# Patient Record
Sex: Male | Born: 1970 | Race: White | Hispanic: No | Marital: Married | Smoking: Current every day smoker
Health system: Southern US, Community
[De-identification: ages and names within clinical notes are randomized; demographics above are authoritative.]

## PROBLEM LIST (undated history)

## (undated) DIAGNOSIS — F329 Major depressive disorder, single episode, unspecified: Secondary | ICD-10-CM

## (undated) DIAGNOSIS — M8700 Idiopathic aseptic necrosis of unspecified bone: Secondary | ICD-10-CM

## (undated) DIAGNOSIS — E663 Overweight: Secondary | ICD-10-CM

## (undated) DIAGNOSIS — E785 Hyperlipidemia, unspecified: Secondary | ICD-10-CM

## (undated) DIAGNOSIS — F1011 Alcohol abuse, in remission: Secondary | ICD-10-CM

## (undated) DIAGNOSIS — F32A Depression, unspecified: Secondary | ICD-10-CM

## (undated) DIAGNOSIS — K219 Gastro-esophageal reflux disease without esophagitis: Secondary | ICD-10-CM

## (undated) DIAGNOSIS — M1990 Unspecified osteoarthritis, unspecified site: Secondary | ICD-10-CM

## (undated) DIAGNOSIS — I10 Essential (primary) hypertension: Secondary | ICD-10-CM

## (undated) DIAGNOSIS — F419 Anxiety disorder, unspecified: Secondary | ICD-10-CM

## (undated) DIAGNOSIS — R7301 Impaired fasting glucose: Secondary | ICD-10-CM

## (undated) HISTORY — DX: Anxiety disorder, unspecified: F41.9

## (undated) HISTORY — DX: Overweight: E66.3

## (undated) HISTORY — DX: Impaired fasting glucose: R73.01

## (undated) HISTORY — PX: CARPAL TUNNEL RELEASE: SHX101

## (undated) HISTORY — PX: TESTICLE TORSION REDUCTION: SHX795

## (undated) HISTORY — DX: Hyperlipidemia, unspecified: E78.5

## (undated) HISTORY — DX: Alcohol abuse, in remission: F10.11

---

## 1993-10-05 HISTORY — PX: VASECTOMY: SHX75

## 1995-10-06 HISTORY — PX: VASECTOMY REVERSAL: SHX243

## 2004-07-25 ENCOUNTER — Ambulatory Visit: Payer: Self-pay | Admitting: Unknown Physician Specialty

## 2004-10-28 ENCOUNTER — Ambulatory Visit: Payer: Self-pay | Admitting: Unknown Physician Specialty

## 2006-03-03 ENCOUNTER — Emergency Department: Payer: Self-pay | Admitting: Internal Medicine

## 2006-03-06 ENCOUNTER — Emergency Department: Payer: Self-pay | Admitting: Emergency Medicine

## 2007-05-11 ENCOUNTER — Ambulatory Visit: Payer: Self-pay | Admitting: Unknown Physician Specialty

## 2007-05-11 ENCOUNTER — Other Ambulatory Visit: Payer: Self-pay

## 2007-05-12 ENCOUNTER — Ambulatory Visit: Payer: Self-pay | Admitting: Unknown Physician Specialty

## 2011-06-06 ENCOUNTER — Emergency Department: Payer: Self-pay | Admitting: Emergency Medicine

## 2011-10-10 ENCOUNTER — Emergency Department: Payer: Self-pay | Admitting: Unknown Physician Specialty

## 2011-10-10 LAB — URINALYSIS, COMPLETE
Bilirubin,UR: NEGATIVE
Glucose,UR: NEGATIVE mg/dL (ref 0–75)
Ketone: NEGATIVE
Leukocyte Esterase: NEGATIVE
Nitrite: NEGATIVE
Ph: 5 (ref 4.5–8.0)
Protein: NEGATIVE
RBC,UR: 706 /HPF (ref 0–5)
Specific Gravity: 1.02 (ref 1.003–1.030)
Squamous Epithelial: <1
WBC UR: 4 /HPF (ref 0–5)

## 2012-10-04 ENCOUNTER — Emergency Department: Payer: Self-pay | Admitting: Emergency Medicine

## 2013-04-05 ENCOUNTER — Ambulatory Visit: Payer: Self-pay | Admitting: Orthopedic Surgery

## 2013-12-18 NOTE — Progress Notes (Signed)
Surgery on 12/26/13.  Proep on 3/20 at 130pm.Need orders in EPIC.  Thank You.

## 2013-12-19 ENCOUNTER — Encounter (HOSPITAL_COMMUNITY): Payer: Self-pay | Admitting: Pharmacy Technician

## 2013-12-20 NOTE — Progress Notes (Signed)
Surgery on 12/26/13.  preop on 12/22/13 at 130pm.   Need orders in EPIC.  Thank You.

## 2013-12-21 NOTE — Patient Instructions (Addendum)
      Your procedure is scheduled on:  12/26/13 TUESDAY  Report to [facility] at   0845    AM.  Call this number if you have problems the morning of surgery: [phone]        Do not eat food  Or drink :After Midnight. Monday NIGHT   Take these medicines the morning of surgery with A SIP OF WATER: METOPROLOL, AMLODIPINE, Tagamet   .  Contacts, dentures or partial plates, or metal hairpins  can not be worn to surgery. Your family will be responsible for glasses, dentures, hearing aides while you are in surgery  Leave suitcase in the car. After surgery it may be brought to your room.  For patients admitted to the hospital, checkout time is 11:00 AM day of  discharge.                DO NOT WEAR JEWELRY, LOTIONS, POWDERS, OR PERFUMES.  WOMEN-- DO NOT SHAVE LEGS OR UNDERARMS FOR 48 HOURS BEFORE SHOWERS. MEN MAY SHAVE FACE.  Patients discharged the day of surgery will not be allowed to drive home. IF going home the day of surgery, you must have a driver and someone to stay with you for the first 24 hours  Name and phone number of your driver: ADMISSION                                                                       Please read over the following fact sheets that you were given: MRSA Information, Incentive Spirometry Sheet, Blood Transfusion Sheet  Information                     FAILURE TO FOLLOW THESE INSTRUCTIONS MAY RESULT IN  CANCELLATION   OF YOUR SURGERY                                                  Patient Signature _____________________________

## 2013-12-22 ENCOUNTER — Ambulatory Visit (HOSPITAL_COMMUNITY)
Admission: RE | Admit: 2013-12-22 | Discharge: 2013-12-22 | Disposition: A | Discharge status: Home / Self Care | Payer: BC Managed Care – PPO | Source: Ambulatory Visit | Attending: Orthopedic Surgery | Admitting: Orthopedic Surgery

## 2013-12-22 ENCOUNTER — Encounter (HOSPITAL_COMMUNITY): Payer: Self-pay

## 2013-12-22 ENCOUNTER — Encounter (HOSPITAL_COMMUNITY)
Admission: RE | Admit: 2013-12-22 | Discharge: 2013-12-22 | Disposition: A | Discharge status: Home / Self Care | Payer: BC Managed Care – PPO | Source: Ambulatory Visit | Attending: Orthopedic Surgery | Admitting: Orthopedic Surgery

## 2013-12-22 DIAGNOSIS — Z01812 Encounter for preprocedural laboratory examination: Secondary | ICD-10-CM | POA: Insufficient documentation

## 2013-12-22 DIAGNOSIS — Z01818 Encounter for other preprocedural examination: Secondary | ICD-10-CM | POA: Insufficient documentation

## 2013-12-22 HISTORY — DX: Gastro-esophageal reflux disease without esophagitis: K21.9

## 2013-12-22 HISTORY — DX: Essential (primary) hypertension: I10

## 2013-12-22 HISTORY — DX: Idiopathic aseptic necrosis of unspecified bone: M87.00

## 2013-12-22 HISTORY — DX: Unspecified osteoarthritis, unspecified site: M19.90

## 2013-12-22 LAB — SURGICAL PCR SCREEN
MRSA, PCR: NEGATIVE
Staphylococcus aureus: NEGATIVE

## 2013-12-22 LAB — BASIC METABOLIC PANEL
BUN: 13 mg/dL (ref 6–23)
CO2: 22 mEq/L (ref 19–32)
Calcium: 9.8 mg/dL (ref 8.4–10.5)
Chloride: 101 mEq/L (ref 96–112)
Creatinine, Ser: 0.91 mg/dL (ref 0.50–1.35)
GFR calc Af Amer: >90 mL/min (ref >90)
GFR calc non Af Amer: >90 mL/min (ref >90)
Glucose, Bld: 108 mg/dL — ABNORMAL HIGH (ref 70–99)
Potassium: 3.8 mEq/L (ref 3.7–5.3)
Sodium: 142 mEq/L (ref 137–147)

## 2013-12-22 LAB — URINE MICROSCOPIC-ADD ON

## 2013-12-22 LAB — CBC
HCT: 46 % (ref 39.0–52.0)
Hemoglobin: 17 g/dL (ref 13.0–17.0)
MCH: 33.5 pg (ref 26.0–34.0)
MCHC: 37 g/dL — ABNORMAL HIGH (ref 30.0–36.0)
MCV: 90.7 fL (ref 78.0–100.0)
Platelets: 208 10*3/uL (ref 150–400)
RBC: 5.07 MIL/uL (ref 4.22–5.81)
RDW: 12.9 % (ref 11.5–15.5)
WBC: 10.5 10*3/uL (ref 4.0–10.5)

## 2013-12-22 LAB — APTT: aPTT: 27 seconds (ref 24–37)

## 2013-12-22 LAB — PROTIME-INR
INR: 0.96 (ref 0.00–1.49)
Prothrombin Time: 12.6 seconds (ref 11.6–15.2)

## 2013-12-22 LAB — URINALYSIS, ROUTINE W REFLEX MICROSCOPIC
Bilirubin Urine: NEGATIVE
Glucose, UA: NEGATIVE mg/dL
Hgb urine dipstick: NEGATIVE
Ketones, ur: NEGATIVE mg/dL
Leukocytes, UA: NEGATIVE
Nitrite: NEGATIVE
Protein, ur: 30 mg/dL — AB
Specific Gravity, Urine: 1.026 (ref 1.005–1.030)
Urobilinogen, UA: 1 mg/dL (ref 0.0–1.0)
pH: 6 (ref 5.0–8.0)

## 2013-12-22 LAB — ABO/RH: ABO/RH(D): B POS

## 2013-12-25 ENCOUNTER — Ambulatory Visit: Payer: Self-pay | Admitting: Family Medicine

## 2013-12-25 NOTE — Progress Notes (Signed)
LATE ENTRY FOR PST 12/22/13-  obtained records and EKG from PCP visit 12/19/13.  Faxed urine with micro to Dr Charlann Boxerlin via EPIC.    No mention of recent dog bite when asked if had any rashes, sores or open areas at PST visit

## 2013-12-26 ENCOUNTER — Encounter (HOSPITAL_COMMUNITY): Admission: RE | Payer: Self-pay | Source: Ambulatory Visit

## 2013-12-26 ENCOUNTER — Inpatient Hospital Stay (HOSPITAL_COMMUNITY)
Admission: RE | Admit: 2013-12-26 | Payer: BC Managed Care – PPO | Source: Ambulatory Visit | Admitting: Orthopedic Surgery

## 2013-12-26 LAB — TYPE AND SCREEN
ABO/RH(D): B POS
Antibody Screen: NEGATIVE

## 2013-12-26 SURGERY — ARTHROPLASTY, HIP, TOTAL, ANTERIOR APPROACH
Anesthesia: Choice | Site: Hip | Laterality: Left

## 2014-01-16 ENCOUNTER — Encounter (HOSPITAL_COMMUNITY): Payer: Self-pay | Admitting: Pharmacy Technician

## 2014-01-16 NOTE — Patient Instructions (Addendum)
[name] White  01/23/2014                           YOUR PROCEDURE IS SCHEDULED ON: 01/30/14               PLEASE REPORT TO SHORT STAY CENTER AT :  6:00 am               CALL THIS NUMBER IF ANY PROBLEMS THE DAY OF SURGERY :               [phone]                                REMEMBER:   Do not eat food or drink liquids AFTER MIDNIGHT                Take these medicines the morning of surgery with               A SIPS OF WATER :  AMLODIPINE / METOPROLOL / VENLAFAXINE       Do not wear jewelry, make-up   Do not wear lotions, powders, or perfumes.   Do not shave legs or underarms 12 hrs. before surgery (men may shave face)  Do not bring valuables to the hospital.  Contacts, dentures or bridgework may not be worn into surgery.  Leave suitcase in the car. After surgery it may be brought to your room.  For patients admitted to the hospital more than one night, checkout time is            11:00 AM                                                         [facility] - Preparing for Surgery Before surgery, you can play an important role.  Because skin is not sterile, your skin needs to be as free of germs as possible.  You can reduce the number of germs on your skin by washing with CHG (chlorahexidine gluconate) soap before surgery.  CHG is an antiseptic cleaner which kills germs and bonds with the skin to continue killing germs even after washing. Please DO NOT use if you have an allergy to CHG or antibacterial soaps.  If your skin becomes reddened/irritated stop using the CHG and inform your nurse when you arrive at Short Stay. Do not shave (including legs and underarms) for at least 48 hours prior to the first CHG shower.  You may shave your face. Please follow these instructions carefully:  1.  Shower with CHG Soap the night before surgery and the  morning of Surgery.   2.  If you choose to wash your hair, wash your hair first as usual with your  normal   Shampoo.   3.  After you shampoo, rinse your hair and body thoroughly to remove the  shampoo.                                         4.  Use CHG as you would any other liquid soap.  You can apply chg directly  to the  skin and wash . Gently wash with scrungie or clean wascloth    5.  Apply the CHG Soap to your body ONLY FROM THE NECK DOWN.   Do not use on open                           Wound or open sores. Avoid contact with eyes, ears mouth and genitals (private parts).                        Genitals (private parts) with your normal soap.              6.  Wash thoroughly, paying special attention to the area where your surgery  will be performed.   7.  Thoroughly rinse your body with warm water from the neck down.   8.  DO NOT shower/wash with your normal soap after using and rinsing off  the CHG Soap .                9.  Pat yourself dry with a clean towel.             10.  Wear clean pajamas.             11.  Place clean sheets on your bed the night of your first shower and do not  sleep with pets.  Day of Surgery : Do not apply any lotions/deodorants the morning of surgery.  Please wear clean clothes to the hospital/surgery center.  FAILURE TO FOLLOW THESE INSTRUCTIONS MAY RESULT IN THE CANCELLATION OF YOUR SURGERY    PATIENT SIGNATURE_________________________________     Incentive Spirometer  An incentive spirometer is a tool that can help keep your lungs clear and active. This tool measures how well you are filling your lungs with each breath. Taking long deep breaths may help reverse or decrease the chance of developing breathing (pulmonary) problems (especially infection) following:  A long period of time when you are unable to move or be active. BEFORE THE PROCEDURE   If the spirometer includes an indicator to show your best effort, your nurse or respiratory therapist will set it to a desired goal.  If possible, sit up straight or lean slightly forward. Try not  to slouch.  Hold the incentive spirometer in an upright position. INSTRUCTIONS FOR USE  1. Sit on the edge of your bed if possible, or sit up as far as you can in bed or on a chair. 2. Hold the incentive spirometer in an upright position. 3. Breathe out normally. 4. Place the mouthpiece in your mouth and seal your lips tightly around it. 5. Breathe in slowly and as deeply as possible, raising the piston or the ball toward the top of the column. 6. Hold your breath for 3-5 seconds or for as long as possible. Allow the piston or ball to fall to the bottom of the column. 7. Remove the mouthpiece from your mouth and breathe out normally. 8. Rest for a few seconds and repeat Steps 1 through 7 at least 10 times every 1-2 hours when you are awake. Take your time and take a few normal breaths between deep breaths. 9. The spirometer may include an indicator to show your best effort. Use the indicator as a goal to work toward during each repetition. 10. After each set of 10 deep breaths, practice coughing to be sure your lungs are clear. If you have  an incision (the cut made at the time of surgery), support your incision when coughing by placing a pillow or rolled up towels firmly against it. Once you are able to get out of bed, walk around indoors and cough well. You may stop using the incentive spirometer when instructed by your caregiver.  RISKS AND COMPLICATIONS  Take your time so you do not get dizzy or light-headed.  If you are in pain, you may need to take or ask for pain medication before doing incentive spirometry. It is harder to take a deep breath if you are having pain. AFTER USE  Rest and breathe slowly and easily.  It can be helpful to keep track of a log of your progress. Your caregiver can provide you with a simple table to help with this. If you are using the spirometer at home, follow these instructions: SEEK MEDICAL CARE IF:   You are having difficultly using the  spirometer.  You have trouble using the spirometer as often as instructed.  Your pain medication is not giving enough relief while using the spirometer.  You develop fever of 100.5 F (38.1 C) or higher. SEEK IMMEDIATE MEDICAL CARE IF:   You cough up bloody sputum that had not been present before.  You develop fever of 102 F (38.9 C) or greater.  You develop worsening pain at or near the incision site. MAKE SURE YOU:   Understand these instructions.  Will watch your condition.  Will get help right away if you are not doing well or get worse. Document Released: 02/01/2007 Document Revised: 12/14/2011 Document Reviewed: 04/04/2007 [facility] Patient Information 2014 [place], [place].   WHAT IS A BLOOD TRANSFUSION? Blood Transfusion Information  A transfusion is the replacement of blood or some of its parts. Blood is made up of multiple cells which provide different functions.  Red blood cells carry oxygen and are used for blood loss replacement.  White blood cells fight against infection.  Platelets control bleeding.  Plasma helps clot blood.  Other blood products are available for specialized needs, such as hemophilia or other clotting disorders. BEFORE THE TRANSFUSION  Who gives blood for transfusions?   Healthy volunteers who are fully evaluated to make sure their blood is safe. This is blood bank blood. Transfusion therapy is the safest it has ever been in the practice of medicine. Before blood is taken from a donor, a complete history is taken to make sure that person has no history of diseases nor engages in risky social behavior (examples are intravenous drug use or sexual activity with multiple partners). The donor's travel history is screened to minimize risk of transmitting infections, such as malaria. The donated blood is tested for signs of infectious diseases, such as HIV and hepatitis. The blood is then tested to be sure it is compatible with you in order to  minimize the chance of a transfusion reaction. If you or a relative donates blood, this is often done in anticipation of surgery and is not appropriate for emergency situations. It takes many days to process the donated blood. RISKS AND COMPLICATIONS Although transfusion therapy is very safe and saves many lives, the main dangers of transfusion include:   Getting an infectious disease.  Developing a transfusion reaction. This is an allergic reaction to something in the blood you were given. Every precaution is taken to prevent this. The decision to have a blood transfusion has been considered carefully by your caregiver before blood is given. Blood is not given unless  the benefits outweigh the risks. AFTER THE TRANSFUSION  Right after receiving a blood transfusion, you will usually feel much better and more energetic. This is especially true if your red blood cells have gotten low (anemic). The transfusion raises the level of the red blood cells which carry oxygen, and this usually causes an energy increase.  The nurse administering the transfusion will monitor you carefully for complications. HOME CARE INSTRUCTIONS  No special instructions are needed after a transfusion. You may find your energy is better. Speak with your caregiver about any limitations on activity for underlying diseases you may have. SEEK MEDICAL CARE IF:   Your condition is not improving after your transfusion.  You develop redness or irritation at the intravenous (IV) site. SEEK IMMEDIATE MEDICAL CARE IF:  Any of the following symptoms occur over the next 12 hours:  Shaking chills.  You have a temperature by mouth above 102 F (38.9 C), not controlled by medicine.  Chest, back, or muscle pain.  People around you feel you are not acting correctly or are confused.  Shortness of breath or difficulty breathing.  Dizziness and fainting.  You get a rash or develop hives.  You have a decrease in urine  output.  Your urine turns a dark color or changes to pink, red, or brown. Any of the following symptoms occur over the next 10 days:  You have a temperature by mouth above 102 F (38.9 C), not controlled by medicine.  Shortness of breath.  Weakness after normal activity.  The white part of the eye turns yellow (jaundice).  You have a decrease in the amount of urine or are urinating less often.  Your urine turns a dark color or changes to pink, red, or brown. Document Released: 09/18/2000 Document Revised: 12/14/2011 Document Reviewed: 05/07/2008 [facility] Patient Information 2014 [place], [place].

## 2014-01-17 NOTE — H&P (Signed)
TOTAL HIP ADMISSION H&P  Patient is admitted for left total hip arthroplasty, anterior approach.  Subjective:  Chief Complaint:    Left hip AVN / pain  HPI: [name] White, 43 y.o. male, has a history of pain and functional disability in the left hip(s) due to AVN and patient has failed non-surgical conservative treatments for greater than 12 weeks to include NSAID's and/or analgesics, corticosteriod injections and activity modification.  Onset of symptoms was gradual starting in November 2013 with gradually worsening course since that time.The patient noted no past surgery on the left hip(s).  Patient currently rates pain in the left hip at 10 out of 10 with activity. Patient has night pain, worsening of pain with activity and weight bearing, trendelenberg gait, pain that interfers with activities of daily living and pain with passive range of motion. Patient has evidence of periarticular osteophytes, joint space narrowing and AVN by imaging studies. This condition presents safety issues increasing the risk of falls.  There is no current active infection.   Risks, benefits and expectations were discussed with the patient.  Risks including but not limited to the risk of anesthesia, blood clots, nerve damage, blood vessel damage, failure of the prosthesis, infection and up to and including death.  Patient understand the risks, benefits and expectations and wishes to proceed with surgery.   D/C Plans:   Home with HHPT  Post-op Meds:    No Rx given  Tranexamic Acid:   To be given  Decadron:    To be given  FYI:    ASA post-op  Norco post-op    Past Medical History  Diagnosis Date  . Hypertension   . GERD (gastroesophageal reflux disease)   . Arthritis   . Avascular necrosis     Past Surgical History  Procedure Laterality Date  . Carpal tunnel release Bilateral     right x 2, left x 1  . Vasectomy  1995  . Vasectomy reversal  1997  . Testicle torsion reduction      No  prescriptions prior to admission   Allergies  Allergen Reactions  . Captopril Anaphylaxis  . Enalapril Anaphylaxis  . Fosinopril Anaphylaxis  . Lisinopril Anaphylaxis  . Ramipril Anaphylaxis    History  Substance Use Topics  . Smoking status: Current Some Day Smoker -- 1.00 packs/day for 30 years    Types: Cigarettes  . Smokeless tobacco: Never Used  . Alcohol Use: Yes     Comment: mixed drink daily    No family history on file.   Review of Systems  Constitutional: Negative.   HENT: Negative.   Eyes: Negative.   Respiratory: Negative.   Cardiovascular: Negative.   Gastrointestinal: Positive for heartburn.  Genitourinary: Negative.   Musculoskeletal: Positive for joint pain.  Skin: Negative.   Neurological: Negative.   Endo/Heme/Allergies: Negative.   Psychiatric/Behavioral: Negative.     Objective:  Physical Exam  Constitutional: He is oriented to person, place, and time. He appears well-developed and well-nourished.  HENT:  Head: Normocephalic and atraumatic.  Mouth/Throat: Oropharynx is clear and moist.  Eyes: Pupils are equal, round, and reactive to light.  Neck: Normal range of motion. No JVD present. No tracheal deviation present. No thyromegaly present.  Cardiovascular: Normal rate, regular rhythm, normal heart sounds and intact distal pulses.   Respiratory: Effort normal and breath sounds normal. No stridor. No respiratory distress. He has no wheezes.  GI: Soft. There is no tenderness. There is no guarding.  Musculoskeletal:  Left hip: He exhibits decreased range of motion, decreased strength, tenderness and bony tenderness. He exhibits no swelling, no deformity and no laceration.  Lymphadenopathy:    He has no cervical adenopathy.  Neurological: He is alert and oriented to person, place, and time.  Skin: Skin is warm and dry.  Psychiatric: He has a normal mood and affect.    Labs:  Estimated body mass index is 25.39 kg/(m^2) as calculated from  the following:   Height as of 12/22/13: 5\' 9"  (1.753 m).   Weight as of 12/22/13: 78.019 kg (172 lb).   Imaging Review Plain radiographs demonstrate severe degenerative joint disease of the left hip(s). The bone quality appears to be good for age and reported activity level.  Assessment/Plan:  End stage arthritis, left hip(s)  The patient history, physical examination, clinical judgement of the provider and imaging studies are consistent with end stage degenerative joint disease of the left hip(s) and total hip arthroplasty is deemed medically necessary. The treatment options including medical management, injection therapy, arthroscopy and arthroplasty were discussed at length. The risks and benefits of total hip arthroplasty were presented and reviewed. The risks due to aseptic loosening, infection, stiffness, dislocation/subluxation,  thromboembolic complications and other imponderables were discussed.  The patient acknowledged the explanation, agreed to proceed with the plan and consent was signed. Patient is being admitted for inpatient treatment for surgery, pain control, PT, OT, prophylactic antibiotics, VTE prophylaxis, progressive ambulation and ADL's and discharge planning.The patient is planning to be discharged home with home health services.      Anastasio AuerbachMatthew S. Shir Bergman   PAC  01/17/2014, 9:30 PM

## 2014-01-18 ENCOUNTER — Other Ambulatory Visit: Payer: Self-pay | Admitting: Orthopedic Surgery

## 2014-01-18 NOTE — H&P (Signed)
[name] White DOB: 08/05/1971 Married / Language: English / Race: White Male  Date of Admission:  01-30-2014  Chief Complaint:  Left Hip Pain  History of Present Illness The patient is a 43 year old male who comes in today for a preoperative History and Physical. The patient is scheduled for a left total hip arthroplasty (anterior approach) to be performed by Dr. Madlyn FrankelMatthew D. Charlann Boxerlin, MD at [facility] on 01-30-2014.  He was originally scheduled for surgery last month but was postponed until 4/28.  He has been cleared and now ready to proceed. The patient reports left lateral hip and left anterior hip symptoms that have been present for 1 year(s). The symptoms began without any known injury. Symptoms reported include hip pain, weakness, tenderness, pain with weightbearing, popping (very painful), difficulty flexing hip, difficulty rotating hip and difficulty bearing weight The patient reports symptoms radiating to the: left groin, left thigh anteriorly and left calf. Symptoms are exacerbated by movement, flexing hip, squatting and weight bearing. The patient is not currently being treated for this problem. Previous workup for this problem has included hip MRI and orthopedic consultation ([facility]). This problem has not been previously treated. Previous evaluation has revealed AVN of the left hip. He has had increasing pain and difficulty walking on the left hip. He has done his research and through talking with people he would like to proceed with surgery with Dr. Charlann Boxerlin. Do to the pain and failure of previous treatments including NSAIDs, cortisone injections and activity modifications. Risks, benefits and expectation were discussed with the pt, pt understood and wished to proceed.  Allergies Lisinopril *CHEMICALS*. Swelling, Difficulty breathing.   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 Mother. Cerebrovascular Accident. Father. Diabetes Mellitus. Mother.    Social History Current drinker. 12/13/2013: Currently drinks hard liquor less than 5 times per week Children. 5 or more Tobacco use. Current every day smoker. 12/13/2013: smoke(d) 3/4 pack(s) per day Current work status. working full time Exercise. Exercises daily; does running / walking Living situation. live with spouse Marital status. married No history of drug/alcohol rehab Not under pain contract Post-Surgical Plans. Plan for Home    Medication History AmLODIPine Besylate (10MG  Tablet, Oral) Active. Metoprolol Succinate ER (50MG  Tablet ER 24HR, Oral) Active.    Past Surgical History Vasectomy Carpal Tunnel Repair. bilateral    Review of Systems General:Not Present- Chills, Fever, Night Sweats, Fatigue, Weight Gain, Weight Loss and Memory Loss. Skin:Not Present- Hives, Itching, Rash, Eczema and Lesions. HEENT:Not Present- Tinnitus, Headache, Double Vision, Visual Loss, Hearing Loss and Dentures. Respiratory:Present- Allergies (pollen). Not Present- Shortness of breath with exertion, Shortness of breath at rest, Coughing up blood and Chronic Cough. Cardiovascular:Not Present- Chest Pain, Racing/skipping heartbeats, Difficulty Breathing Lying Down, Murmur, Swelling and Palpitations. Gastrointestinal:Not Present- Bloody Stool, Heartburn, Abdominal Pain, Vomiting, Nausea, Constipation, Diarrhea, Difficulty Swallowing, Jaundice and Loss of appetitie. Male Genitourinary:Not Present- Urinary frequency, Blood in Urine, Weak urinary stream, Discharge, Flank Pain, Incontinence, Painful Urination, Urgency, Urinary Retention and Urinating at Night. Musculoskeletal:Not Present- Muscle Weakness, Muscle Pain, Joint Swelling, Joint Pain, Back Pain, Morning Stiffness and Spasms. Neurological:Not Present- Tremor,  Dizziness, Blackout spells, Paralysis, Difficulty with balance and Weakness. Psychiatric:Not Present- Insomnia.    Vitals Pulse: 84 (Regular) Resp.: 16 (Unlabored) BP: 138/78 (Sitting, Right Arm, Standard)   Physical Exam The physical exam findings are  as follows:   General Mental Status - Alert, cooperative and good historian. General Appearance- pleasant. Not in acute distress. Orientation- Oriented X3. Build & Nutrition- Well nourished and Well developed.   Head and Neck Head- normocephalic, atraumatic . Neck Global Assessment- supple. no bruit auscultated on the right and no bruit auscultated on the left.   Eye Pupil- Bilateral- Regular and Round. Motion- Bilateral- EOMI.   Chest and Lung Exam Auscultation: Breath sounds:- clear at anterior chest wall and - clear at posterior chest wall. Adventitious sounds:- No Adventitious sounds.   Cardiovascular Auscultation:Rhythm- Regular rate and rhythm. Heart Sounds- S1 WNL and S2 WNL. Murmurs & Other Heart Sounds:Auscultation of the heart reveals - No Murmurs.   Abdomen Palpation/Percussion:Tenderness- Abdomen is non-tender to palpation. Rigidity (guarding)- Abdomen is soft. Auscultation:Auscultation of the abdomen reveals - Bowel sounds normal.   Male Genitourinary  Not done, not pertinent to present illness  Musculoskeletal  Lumbosacral Spine: Inspection and Palpation - Tenderness - no tenderness to palpation. ROM - full, painless flexion, extension, lateral bending and rotation. Lower Extremity Right Lower Extremity: Right Knee: Inspection and Palpation: Tenderness - no tenderness to palpation. Left Lower Extremity: Left Hip : Inspection and Palpation: Tenderness - generalized tenderness about the hip and tender to palpation about the groin. Sensation - intact to light touch. Other characteristics - normal warmth. Assessment of pain reveals the following findings::  The pain is characterized as - severe and constant. Location: - pain referred from groin to upper medial thigh. The pain is relieved by - rest. Strength and Tone: Testing limited - due to guarding and due to pain. ROM: Testing limited - due to guarding and due to pain. Left Knee: Inspection and Palpation: Tenderness - no tenderness to palpation. Gait and Station: Evaluation of Gait: Gait Pattern - antalgic. Assistive Devices - no assistive devices.   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 Charlann Boxerlin.  Plan is to go home  PCP - Dr. Sherie DonLada  The patient does not have any contraindications and will receive TXA (tranexamic acid) prior to surgery.  Signed electronically by Lauraine RinneAlexzandrew L Jazsmin Couse, III PA-C

## 2014-01-23 ENCOUNTER — Encounter (HOSPITAL_COMMUNITY)
Admission: RE | Admit: 2014-01-23 | Discharge: 2014-01-23 | Disposition: A | Discharge status: Home / Self Care | Payer: BC Managed Care – PPO | Source: Ambulatory Visit | Attending: Orthopedic Surgery | Admitting: Orthopedic Surgery

## 2014-01-23 ENCOUNTER — Encounter (HOSPITAL_COMMUNITY): Payer: Self-pay

## 2014-01-23 DIAGNOSIS — Z01812 Encounter for preprocedural laboratory examination: Secondary | ICD-10-CM | POA: Insufficient documentation

## 2014-01-23 DIAGNOSIS — M169 Osteoarthritis of hip, unspecified: Secondary | ICD-10-CM | POA: Insufficient documentation

## 2014-01-23 DIAGNOSIS — M1610 Unilateral primary osteoarthritis, unspecified hip: Secondary | ICD-10-CM | POA: Insufficient documentation

## 2014-01-23 HISTORY — DX: Depression, unspecified: F32.A

## 2014-01-23 HISTORY — DX: Major depressive disorder, single episode, unspecified: F32.9

## 2014-01-23 LAB — URINALYSIS, ROUTINE W REFLEX MICROSCOPIC
Bilirubin Urine: NEGATIVE
Glucose, UA: NEGATIVE mg/dL
Hgb urine dipstick: NEGATIVE
Ketones, ur: NEGATIVE mg/dL
Leukocytes, UA: NEGATIVE
Nitrite: NEGATIVE
Protein, ur: NEGATIVE mg/dL
Specific Gravity, Urine: 1.012 (ref 1.005–1.030)
Urobilinogen, UA: 0.2 mg/dL (ref 0.0–1.0)
pH: 6.5 (ref 5.0–8.0)

## 2014-01-23 LAB — BASIC METABOLIC PANEL
BUN: 9 mg/dL (ref 6–23)
CO2: 25 mEq/L (ref 19–32)
Calcium: 9.9 mg/dL (ref 8.4–10.5)
Chloride: 101 mEq/L (ref 96–112)
Creatinine, Ser: 0.73 mg/dL (ref 0.50–1.35)
GFR calc Af Amer: >90 mL/min (ref >90)
GFR calc non Af Amer: >90 mL/min (ref >90)
Glucose, Bld: 89 mg/dL (ref 70–99)
Potassium: 3.8 mEq/L (ref 3.7–5.3)
Sodium: 138 mEq/L (ref 137–147)

## 2014-01-23 LAB — CBC
HCT: 46 % (ref 39.0–52.0)
Hemoglobin: 16.6 g/dL (ref 13.0–17.0)
MCH: 31.9 pg (ref 26.0–34.0)
MCHC: 36.1 g/dL — ABNORMAL HIGH (ref 30.0–36.0)
MCV: 88.5 fL (ref 78.0–100.0)
Platelets: 201 10*3/uL (ref 150–400)
RBC: 5.2 MIL/uL (ref 4.22–5.81)
RDW: 12.5 % (ref 11.5–15.5)
WBC: 7.6 10*3/uL (ref 4.0–10.5)

## 2014-01-23 LAB — SURGICAL PCR SCREEN
MRSA, PCR: NEGATIVE
Staphylococcus aureus: NEGATIVE

## 2014-01-23 LAB — PROTIME-INR
INR: 0.89 (ref 0.00–1.49)
Prothrombin Time: 11.9 seconds (ref 11.6–15.2)

## 2014-01-23 LAB — APTT: aPTT: 25 seconds (ref 24–37)

## 2014-01-30 ENCOUNTER — Inpatient Hospital Stay (HOSPITAL_COMMUNITY): Payer: BC Managed Care – PPO

## 2014-01-30 ENCOUNTER — Encounter (HOSPITAL_COMMUNITY): Payer: Self-pay | Admitting: *Deleted

## 2014-01-30 ENCOUNTER — Inpatient Hospital Stay (HOSPITAL_COMMUNITY)
Admission: RE | Admit: 2014-01-30 | Discharge: 2014-01-31 | DRG: 470 | Disposition: A | Discharge status: Home Health | Payer: BC Managed Care – PPO | Source: Ambulatory Visit | Attending: Orthopedic Surgery | Admitting: Orthopedic Surgery

## 2014-01-30 ENCOUNTER — Inpatient Hospital Stay (HOSPITAL_COMMUNITY): Payer: BC Managed Care – PPO | Admitting: Anesthesiology

## 2014-01-30 ENCOUNTER — Encounter (HOSPITAL_COMMUNITY)
Admission: RE | Disposition: A | Discharge status: Home Health | Payer: Self-pay | Source: Ambulatory Visit | Attending: Orthopedic Surgery

## 2014-01-30 ENCOUNTER — Encounter (HOSPITAL_COMMUNITY): Payer: BC Managed Care – PPO | Admitting: Anesthesiology

## 2014-01-30 DIAGNOSIS — M87059 Idiopathic aseptic necrosis of unspecified femur: Principal | ICD-10-CM | POA: Diagnosis present

## 2014-01-30 DIAGNOSIS — Z6825 Body mass index (BMI) 25.0-25.9, adult: Secondary | ICD-10-CM

## 2014-01-30 DIAGNOSIS — F17200 Nicotine dependence, unspecified, uncomplicated: Secondary | ICD-10-CM | POA: Diagnosis present

## 2014-01-30 DIAGNOSIS — I10 Essential (primary) hypertension: Secondary | ICD-10-CM | POA: Diagnosis present

## 2014-01-30 DIAGNOSIS — Z96642 Presence of left artificial hip joint: Secondary | ICD-10-CM

## 2014-01-30 DIAGNOSIS — K219 Gastro-esophageal reflux disease without esophagitis: Secondary | ICD-10-CM | POA: Diagnosis present

## 2014-01-30 HISTORY — PX: TOTAL HIP ARTHROPLASTY: SHX124

## 2014-01-30 LAB — TYPE AND SCREEN
ABO/RH(D): B POS
Antibody Screen: NEGATIVE

## 2014-01-30 SURGERY — ARTHROPLASTY, HIP, TOTAL, ANTERIOR APPROACH
Anesthesia: General | Site: Hip | Laterality: Left

## 2014-01-30 MED ORDER — GLYCOPYRROLATE 0.2 MG/ML IJ SOLN
INTRAMUSCULAR | Status: AC
Start: 2014-01-30 — End: 2014-01-30
  Filled 2014-01-30: qty 2

## 2014-01-30 MED ORDER — ASPIRIN EC 325 MG PO TBEC
325.0000 mg | DELAYED_RELEASE_TABLET | Freq: Two times a day (BID) | ORAL | Status: DC
  Administered 2014-01-30 – 2014-01-31 (×2): 325 mg via ORAL
  Filled 2014-01-30 (×3): qty 1

## 2014-01-30 MED ORDER — ADULT MULTIVITAMIN W/MINERALS CH
1.0000 | ORAL_TABLET | Freq: Every day | ORAL | Status: DC
  Administered 2014-01-30: 1 via ORAL
  Filled 2014-01-30 (×2): qty 1

## 2014-01-30 MED ORDER — HYDROMORPHONE HCL PF 1 MG/ML IJ SOLN
0.5000 mg | INTRAMUSCULAR | Status: DC | PRN
  Administered 2014-01-30: 1 mg via INTRAVENOUS

## 2014-01-30 MED ORDER — SODIUM CHLORIDE 0.9 % IV SOLN
INTRAVENOUS | Status: DC
  Administered 2014-01-30 – 2014-01-31 (×2): via INTRAVENOUS
  Filled 2014-01-30 (×4): qty 1000

## 2014-01-30 MED ORDER — ONDANSETRON HCL 4 MG/2ML IJ SOLN: 4.0000 mg | Freq: Four times a day (QID) | INTRAMUSCULAR | Status: DC | PRN

## 2014-01-30 MED ORDER — METHOCARBAMOL 100 MG/ML IJ SOLN
500.0000 mg | Freq: Four times a day (QID) | INTRAVENOUS | Status: DC | PRN
  Filled 2014-01-30: qty 5

## 2014-01-30 MED ORDER — KETAMINE HCL 10 MG/ML IJ SOLN
INTRAMUSCULAR | Status: DC | PRN
  Administered 2014-01-30: 15 mg via INTRAVENOUS
  Administered 2014-01-30: 10 mg via INTRAVENOUS

## 2014-01-30 MED ORDER — NEOSTIGMINE METHYLSULFATE 1 MG/ML IJ SOLN
INTRAMUSCULAR | Status: DC | PRN
  Administered 2014-01-30: 3 mg via INTRAVENOUS

## 2014-01-30 MED ORDER — CELECOXIB 200 MG PO CAPS
200.0000 mg | ORAL_CAPSULE | Freq: Two times a day (BID) | ORAL | Status: DC
Start: 2014-01-30 — End: 2014-01-31
  Administered 2014-01-30: 200 mg via ORAL
  Filled 2014-01-30 (×3): qty 1

## 2014-01-30 MED ORDER — LIDOCAINE HCL (CARDIAC) 20 MG/ML IV SOLN
INTRAVENOUS | Status: DC | PRN
  Administered 2014-01-30: 30 mg via INTRAVENOUS

## 2014-01-30 MED ORDER — HYDROMORPHONE HCL PF 1 MG/ML IJ SOLN
INTRAMUSCULAR | Status: DC | PRN
  Administered 2014-01-30: 0.5 mg via INTRAVENOUS
  Administered 2014-01-30: 1 mg via INTRAVENOUS
  Administered 2014-01-30: 0.5 mg via INTRAVENOUS

## 2014-01-30 MED ORDER — 0.9 % SODIUM CHLORIDE (POUR BTL) OPTIME
TOPICAL | Status: DC | PRN
  Administered 2014-01-30: 1000 mL

## 2014-01-30 MED ORDER — ONDANSETRON HCL 4 MG PO TABS: 4.0000 mg | ORAL_TABLET | Freq: Four times a day (QID) | ORAL | Status: DC | PRN

## 2014-01-30 MED ORDER — DEXAMETHASONE SODIUM PHOSPHATE 10 MG/ML IJ SOLN
10.0000 mg | Freq: Once | INTRAMUSCULAR | Status: DC
  Filled 2014-01-30: qty 1

## 2014-01-30 MED ORDER — MENTHOL 3 MG MT LOZG: 1.0000 | LOZENGE | OROMUCOSAL | Status: DC | PRN

## 2014-01-30 MED ORDER — MIDAZOLAM HCL 2 MG/2ML IJ SOLN
INTRAMUSCULAR | Status: AC
  Filled 2014-01-30: qty 2

## 2014-01-30 MED ORDER — PROPOFOL 10 MG/ML IV BOLUS
INTRAVENOUS | Status: AC
  Filled 2014-01-30: qty 20

## 2014-01-30 MED ORDER — DOCUSATE SODIUM 100 MG PO CAPS
100.0000 mg | ORAL_CAPSULE | Freq: Two times a day (BID) | ORAL | Status: DC
  Administered 2014-01-30: 100 mg via ORAL

## 2014-01-30 MED ORDER — CEFAZOLIN SODIUM-DEXTROSE 2-3 GM-% IV SOLR
2.0000 g | Freq: Four times a day (QID) | INTRAVENOUS | Status: AC
  Administered 2014-01-30 (×2): 2 g via INTRAVENOUS
  Filled 2014-01-30 (×2): qty 50

## 2014-01-30 MED ORDER — FERROUS SULFATE 325 (65 FE) MG PO TABS
325.0000 mg | ORAL_TABLET | Freq: Three times a day (TID) | ORAL | Status: DC
  Administered 2014-01-30: 325 mg via ORAL
  Filled 2014-01-30 (×5): qty 1

## 2014-01-30 MED ORDER — GLYCOPYRROLATE 0.2 MG/ML IJ SOLN
INTRAMUSCULAR | Status: DC | PRN
  Administered 2014-01-30: 0.4 mg via INTRAVENOUS

## 2014-01-30 MED ORDER — METHOCARBAMOL 500 MG PO TABS
500.0000 mg | ORAL_TABLET | Freq: Four times a day (QID) | ORAL | Status: DC | PRN
  Administered 2014-01-30 (×2): 500 mg via ORAL
  Filled 2014-01-30 (×2): qty 1

## 2014-01-30 MED ORDER — MIDAZOLAM HCL 5 MG/5ML IJ SOLN
INTRAMUSCULAR | Status: DC | PRN
  Administered 2014-01-30 (×2): 1 mg via INTRAVENOUS

## 2014-01-30 MED ORDER — PROPOFOL 10 MG/ML IV BOLUS
INTRAVENOUS | Status: DC | PRN
  Administered 2014-01-30: 175 mg via INTRAVENOUS

## 2014-01-30 MED ORDER — POLYETHYLENE GLYCOL 3350 17 G PO PACK
17.0000 g | PACK | Freq: Two times a day (BID) | ORAL | Status: DC
  Administered 2014-01-30: 17 g via ORAL

## 2014-01-30 MED ORDER — CISATRACURIUM BESYLATE 20 MG/10ML IV SOLN
INTRAVENOUS | Status: AC
  Filled 2014-01-30: qty 20

## 2014-01-30 MED ORDER — METHOCARBAMOL 500 MG PO TABS: 500.0000 mg | ORAL_TABLET | Freq: Four times a day (QID) | ORAL | Status: DC | PRN

## 2014-01-30 MED ORDER — AMLODIPINE BESYLATE 10 MG PO TABS
10.0000 mg | ORAL_TABLET | Freq: Every morning | ORAL | Status: DC
  Filled 2014-01-30: qty 1

## 2014-01-30 MED ORDER — PHENYLEPHRINE 40 MCG/ML (10ML) SYRINGE FOR IV PUSH (FOR BLOOD PRESSURE SUPPORT)
PREFILLED_SYRINGE | INTRAVENOUS | Status: AC
  Filled 2014-01-30: qty 10

## 2014-01-30 MED ORDER — CEFAZOLIN SODIUM-DEXTROSE 2-3 GM-% IV SOLR
INTRAVENOUS | Status: AC
  Filled 2014-01-30: qty 50

## 2014-01-30 MED ORDER — ALUM & MAG HYDROXIDE-SIMETH 200-200-20 MG/5ML PO SUSP: 30.0000 mL | ORAL | Status: DC | PRN

## 2014-01-30 MED ORDER — PROMETHAZINE HCL 25 MG/ML IJ SOLN: 6.2500 mg | INTRAMUSCULAR | Status: DC | PRN

## 2014-01-30 MED ORDER — STERILE WATER FOR IRRIGATION IR SOLN
Status: DC | PRN
  Administered 2014-01-30: 1500 mL

## 2014-01-30 MED ORDER — ONDANSETRON HCL 4 MG/2ML IJ SOLN
INTRAMUSCULAR | Status: AC
  Filled 2014-01-30: qty 2

## 2014-01-30 MED ORDER — DSS 100 MG PO CAPS: 100.0000 mg | ORAL_CAPSULE | Freq: Two times a day (BID) | ORAL | Status: DC

## 2014-01-30 MED ORDER — HYDROMORPHONE HCL PF 1 MG/ML IJ SOLN
INTRAMUSCULAR | Status: AC
  Administered 2014-01-30: 1 mg via INTRAVENOUS
  Filled 2014-01-30: qty 1

## 2014-01-30 MED ORDER — HYDROMORPHONE HCL PF 2 MG/ML IJ SOLN
INTRAMUSCULAR | Status: AC
  Filled 2014-01-30: qty 1

## 2014-01-30 MED ORDER — ONDANSETRON HCL 4 MG/2ML IJ SOLN
INTRAMUSCULAR | Status: DC | PRN
  Administered 2014-01-30: 2 mg via INTRAVENOUS
  Administered 2014-01-30: 4 mg via INTRAVENOUS
  Administered 2014-01-30: 2 mg via INTRAVENOUS

## 2014-01-30 MED ORDER — DEXAMETHASONE SODIUM PHOSPHATE 10 MG/ML IJ SOLN
10.0000 mg | Freq: Once | INTRAMUSCULAR | Status: AC
  Administered 2014-01-30: 10 mg via INTRAVENOUS

## 2014-01-30 MED ORDER — METOCLOPRAMIDE HCL 5 MG/ML IJ SOLN: 5.0000 mg | Freq: Three times a day (TID) | INTRAMUSCULAR | Status: DC | PRN

## 2014-01-30 MED ORDER — MAGNESIUM CITRATE PO SOLN: 1.0000 | Freq: Once | ORAL | Status: AC | PRN

## 2014-01-30 MED ORDER — GLYCOPYRROLATE 0.2 MG/ML IJ SOLN
INTRAMUSCULAR | Status: AC
  Filled 2014-01-30: qty 2

## 2014-01-30 MED ORDER — FENTANYL CITRATE 0.05 MG/ML IJ SOLN
INTRAMUSCULAR | Status: AC
  Filled 2014-01-30: qty 2

## 2014-01-30 MED ORDER — HYDROMORPHONE HCL PF 1 MG/ML IJ SOLN: 0.2500 mg | INTRAMUSCULAR | Status: DC | PRN

## 2014-01-30 MED ORDER — HYDROCODONE-ACETAMINOPHEN 7.5-325 MG PO TABS
1.0000 | ORAL_TABLET | ORAL | Status: DC | PRN
  Administered 2014-01-30: 2 via ORAL
  Filled 2014-01-30: qty 1
  Filled 2014-01-30: qty 2

## 2014-01-30 MED ORDER — VENLAFAXINE HCL ER 37.5 MG PO CP24
37.5000 mg | ORAL_CAPSULE | Freq: Every day | ORAL | Status: DC
  Filled 2014-01-30 (×2): qty 1

## 2014-01-30 MED ORDER — FENTANYL CITRATE 0.05 MG/ML IJ SOLN
INTRAMUSCULAR | Status: DC | PRN
  Administered 2014-01-30 (×7): 50 ug via INTRAVENOUS

## 2014-01-30 MED ORDER — CHLORHEXIDINE GLUCONATE 4 % EX LIQD: 60.0000 mL | Freq: Once | CUTANEOUS | Status: DC

## 2014-01-30 MED ORDER — POLYETHYLENE GLYCOL 3350 17 G PO PACK: 17.0000 g | PACK | Freq: Two times a day (BID) | ORAL | Status: DC

## 2014-01-30 MED ORDER — CISATRACURIUM BESYLATE (PF) 10 MG/5ML IV SOLN
INTRAVENOUS | Status: DC | PRN
  Administered 2014-01-30: 2 mg via INTRAVENOUS
  Administered 2014-01-30: 8 mg via INTRAVENOUS

## 2014-01-30 MED ORDER — TRANEXAMIC ACID 100 MG/ML IV SOLN
1000.0000 mg | Freq: Once | INTRAVENOUS | Status: AC
  Administered 2014-01-30: 1000 mg via INTRAVENOUS
  Filled 2014-01-30: qty 10

## 2014-01-30 MED ORDER — EPHEDRINE SULFATE 50 MG/ML IJ SOLN
INTRAMUSCULAR | Status: AC
Start: 2014-01-30 — End: 2014-01-30
  Filled 2014-01-30: qty 1

## 2014-01-30 MED ORDER — METOCLOPRAMIDE HCL 5 MG PO TABS
5.0000 mg | ORAL_TABLET | Freq: Three times a day (TID) | ORAL | Status: DC | PRN
  Administered 2014-01-30: 5 mg via ORAL
  Filled 2014-01-30: qty 2

## 2014-01-30 MED ORDER — NEOSTIGMINE METHYLSULFATE 1 MG/ML IJ SOLN
INTRAMUSCULAR | Status: AC
  Filled 2014-01-30: qty 10

## 2014-01-30 MED ORDER — DEXAMETHASONE SODIUM PHOSPHATE 10 MG/ML IJ SOLN
INTRAMUSCULAR | Status: AC
  Filled 2014-01-30: qty 1

## 2014-01-30 MED ORDER — FENTANYL CITRATE 0.05 MG/ML IJ SOLN
INTRAMUSCULAR | Status: AC
  Filled 2014-01-30: qty 5

## 2014-01-30 MED ORDER — LIDOCAINE HCL (CARDIAC) 20 MG/ML IV SOLN
INTRAVENOUS | Status: AC
  Filled 2014-01-30: qty 10

## 2014-01-30 MED ORDER — SODIUM CHLORIDE 0.9 % IJ SOLN
INTRAMUSCULAR | Status: AC
  Filled 2014-01-30: qty 10

## 2014-01-30 MED ORDER — ACETAMINOPHEN 325 MG PO TABS
650.0000 mg | ORAL_TABLET | Freq: Four times a day (QID) | ORAL | Status: DC | PRN
  Administered 2014-01-30: 650 mg via ORAL
  Filled 2014-01-30: qty 2

## 2014-01-30 MED ORDER — VITAMIN C 500 MG PO TABS
500.0000 mg | ORAL_TABLET | Freq: Every day | ORAL | Status: DC
  Filled 2014-01-30: qty 1

## 2014-01-30 MED ORDER — ACETAMINOPHEN 650 MG RE SUPP: 650.0000 mg | Freq: Four times a day (QID) | RECTAL | Status: DC | PRN

## 2014-01-30 MED ORDER — CEFAZOLIN SODIUM-DEXTROSE 2-3 GM-% IV SOLR
2.0000 g | INTRAVENOUS | Status: AC
  Administered 2014-01-30: 2 g via INTRAVENOUS

## 2014-01-30 MED ORDER — KETAMINE HCL 10 MG/ML IJ SOLN
INTRAMUSCULAR | Status: AC
  Filled 2014-01-30: qty 1

## 2014-01-30 MED ORDER — LACTATED RINGERS IV SOLN
INTRAVENOUS | Status: DC | PRN
  Administered 2014-01-30 (×2): via INTRAVENOUS

## 2014-01-30 MED ORDER — HYDROMORPHONE HCL PF 1 MG/ML IJ SOLN
INTRAMUSCULAR | Status: AC
  Filled 2014-01-30: qty 1

## 2014-01-30 MED ORDER — HYDROCODONE-ACETAMINOPHEN 7.5-325 MG PO TABS: 1.0000 | ORAL_TABLET | ORAL | Status: DC | PRN

## 2014-01-30 MED ORDER — PHENOL 1.4 % MT LIQD
1.0000 | OROMUCOSAL | Status: DC | PRN
  Administered 2014-01-30: 1 via OROMUCOSAL
  Filled 2014-01-30: qty 177

## 2014-01-30 MED ORDER — SUCCINYLCHOLINE CHLORIDE 20 MG/ML IJ SOLN
INTRAMUSCULAR | Status: DC | PRN
  Administered 2014-01-30: 150 mg via INTRAVENOUS

## 2014-01-30 MED ORDER — METOPROLOL SUCCINATE ER 50 MG PO TB24
50.0000 mg | ORAL_TABLET | Freq: Every morning | ORAL | Status: DC
  Filled 2014-01-30: qty 1

## 2014-01-30 MED ORDER — EPHEDRINE SULFATE 50 MG/ML IJ SOLN
INTRAMUSCULAR | Status: DC | PRN
  Administered 2014-01-30: 10 mg via INTRAVENOUS

## 2014-01-30 SURGICAL SUPPLY — 37 items
ADH SKN CLS APL DERMABOND .7 (GAUZE/BANDAGES/DRESSINGS) ×1
BAG SPEC THK2 15X12 ZIP CLS (MISCELLANEOUS)
BAG ZIPLOCK 12X15 (MISCELLANEOUS) IMPLANT
CAPT HIP PF COP ×2 IMPLANT
DERMABOND ADVANCED (GAUZE/BANDAGES/DRESSINGS) ×2
DERMABOND ADVANCED .7 DNX12 (GAUZE/BANDAGES/DRESSINGS) ×1 IMPLANT
DRAPE C-ARM 42X120 X-RAY (DRAPES) ×3 IMPLANT
DRAPE STERI IOBAN 125X83 (DRAPES) ×3 IMPLANT
DRAPE U-SHAPE 47X51 STRL (DRAPES) ×9 IMPLANT
DRSG AQUACEL AG ADV 3.5X10 (GAUZE/BANDAGES/DRESSINGS) ×3 IMPLANT
DURAPREP 26ML APPLICATOR (WOUND CARE) ×3 IMPLANT
ELECT BLADE TIP CTD 4 INCH (ELECTRODE) ×3 IMPLANT
ELECT PENCIL ROCKER SW 15FT (MISCELLANEOUS) ×3 IMPLANT
ELECT REM PT RETURN 15FT ADLT (MISCELLANEOUS) ×3 IMPLANT
FACESHIELD WRAPAROUND (MASK) ×12 IMPLANT
FACESHIELD WRAPAROUND OR TEAM (MASK) ×4 IMPLANT
GLOVE BIOGEL PI IND STRL 7.5 (GLOVE) ×1 IMPLANT
GLOVE BIOGEL PI IND STRL 8 (GLOVE) ×1 IMPLANT
GLOVE BIOGEL PI INDICATOR 7.5 (GLOVE) ×2
GLOVE BIOGEL PI INDICATOR 8 (GLOVE) ×2
GLOVE ECLIPSE 8.0 STRL XLNG CF (GLOVE) ×3 IMPLANT
GLOVE ORTHO TXT STRL SZ7.5 (GLOVE) ×6 IMPLANT
GOWN SPEC L3 XXLG W/TWL (GOWN DISPOSABLE) ×3 IMPLANT
GOWN STRL REUS W/TWL LRG LVL3 (GOWN DISPOSABLE) ×3 IMPLANT
KIT BASIN OR (CUSTOM PROCEDURE TRAY) ×3 IMPLANT
PACK TOTAL JOINT (CUSTOM PROCEDURE TRAY) ×3 IMPLANT
PADDING CAST COTTON 6X4 STRL (CAST SUPPLIES) ×3 IMPLANT
SAW OSC TIP CART 19.5X105X1.3 (SAW) ×3 IMPLANT
SUT MNCRL AB 4-0 PS2 18 (SUTURE) ×3 IMPLANT
SUT VIC AB 1 CT1 36 (SUTURE) ×9 IMPLANT
SUT VIC AB 2-0 CT1 27 (SUTURE) ×6
SUT VIC AB 2-0 CT1 TAPERPNT 27 (SUTURE) ×2 IMPLANT
SUT VLOC 180 0 24IN GS25 (SUTURE) ×3 IMPLANT
TOWEL OR 17X26 10 PK STRL BLUE (TOWEL DISPOSABLE) ×3 IMPLANT
TOWEL OR NON WOVEN STRL DISP B (DISPOSABLE) IMPLANT
TRAY FOLEY CATH 14FRSI W/METER (CATHETERS) ×1 IMPLANT
TRAY FOLEY CATH 16FR SILVER (SET/KITS/TRAYS/PACK) ×2 IMPLANT

## 2014-01-30 NOTE — H&P (View-Only) (Signed)
[name] White DOB: 08/05/1971 Married / Language: English / Race: White Male  Date of Admission:  01-30-2014  Chief Complaint:  Left Hip Pain  History of Present Illness The patient is a 43 year old male who comes in today for a preoperative History and Physical. The patient is scheduled for a left total hip arthroplasty (anterior approach) to be performed by Dr. Madlyn FrankelMatthew D. Charlann Boxerlin, MD at [facility] on 01-30-2014.  He was originally scheduled for surgery last month but was postponed until 4/28.  He has been cleared and now ready to proceed. The patient reports left lateral hip and left anterior hip symptoms that have been present for 1 year(s). The symptoms began without any known injury. Symptoms reported include hip pain, weakness, tenderness, pain with weightbearing, popping (very painful), difficulty flexing hip, difficulty rotating hip and difficulty bearing weight The patient reports symptoms radiating to the: left groin, left thigh anteriorly and left calf. Symptoms are exacerbated by movement, flexing hip, squatting and weight bearing. The patient is not currently being treated for this problem. Previous workup for this problem has included hip MRI and orthopedic consultation ([facility]). This problem has not been previously treated. Previous evaluation has revealed AVN of the left hip. He has had increasing pain and difficulty walking on the left hip. He has done his research and through talking with people he would like to proceed with surgery with Dr. Charlann Boxerlin. Do to the pain and failure of previous treatments including NSAIDs, cortisone injections and activity modifications. Risks, benefits and expectation were discussed with the pt, pt understood and wished to proceed.  Allergies Lisinopril *CHEMICALS*. Swelling, Difficulty breathing.   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 Mother. Cerebrovascular Accident. Father. Diabetes Mellitus. Mother.    Social History Current drinker. 12/13/2013: Currently drinks hard liquor less than 5 times per week Children. 5 or more Tobacco use. Current every day smoker. 12/13/2013: smoke(d) 3/4 pack(s) per day Current work status. working full time Exercise. Exercises daily; does running / walking Living situation. live with spouse Marital status. married No history of drug/alcohol rehab Not under pain contract Post-Surgical Plans. Plan for Home    Medication History AmLODIPine Besylate (10MG  Tablet, Oral) Active. Metoprolol Succinate ER (50MG  Tablet ER 24HR, Oral) Active.    Past Surgical History Vasectomy Carpal Tunnel Repair. bilateral    Review of Systems General:Not Present- Chills, Fever, Night Sweats, Fatigue, Weight Gain, Weight Loss and Memory Loss. Skin:Not Present- Hives, Itching, Rash, Eczema and Lesions. HEENT:Not Present- Tinnitus, Headache, Double Vision, Visual Loss, Hearing Loss and Dentures. Respiratory:Present- Allergies (pollen). Not Present- Shortness of breath with exertion, Shortness of breath at rest, Coughing up blood and Chronic Cough. Cardiovascular:Not Present- Chest Pain, Racing/skipping heartbeats, Difficulty Breathing Lying Down, Murmur, Swelling and Palpitations. Gastrointestinal:Not Present- Bloody Stool, Heartburn, Abdominal Pain, Vomiting, Nausea, Constipation, Diarrhea, Difficulty Swallowing, Jaundice and Loss of appetitie. Male Genitourinary:Not Present- Urinary frequency, Blood in Urine, Weak urinary stream, Discharge, Flank Pain, Incontinence, Painful Urination, Urgency, Urinary Retention and Urinating at Night. Musculoskeletal:Not Present- Muscle Weakness, Muscle Pain, Joint Swelling, Joint Pain, Back Pain, Morning Stiffness and Spasms. Neurological:Not Present- Tremor,  Dizziness, Blackout spells, Paralysis, Difficulty with balance and Weakness. Psychiatric:Not Present- Insomnia.    Vitals Pulse: 84 (Regular) Resp.: 16 (Unlabored) BP: 138/78 (Sitting, Right Arm, Standard)   Physical Exam The physical exam findings are  as follows:   General Mental Status - Alert, cooperative and good historian. General Appearance- pleasant. Not in acute distress. Orientation- Oriented X3. Build & Nutrition- Well nourished and Well developed.   Head and Neck Head- normocephalic, atraumatic . Neck Global Assessment- supple. no bruit auscultated on the right and no bruit auscultated on the left.   Eye Pupil- Bilateral- Regular and Round. Motion- Bilateral- EOMI.   Chest and Lung Exam Auscultation: Breath sounds:- clear at anterior chest wall and - clear at posterior chest wall. Adventitious sounds:- No Adventitious sounds.   Cardiovascular Auscultation:Rhythm- Regular rate and rhythm. Heart Sounds- S1 WNL and S2 WNL. Murmurs & Other Heart Sounds:Auscultation of the heart reveals - No Murmurs.   Abdomen Palpation/Percussion:Tenderness- Abdomen is non-tender to palpation. Rigidity (guarding)- Abdomen is soft. Auscultation:Auscultation of the abdomen reveals - Bowel sounds normal.   Male Genitourinary  Not done, not pertinent to present illness  Musculoskeletal  Lumbosacral Spine: Inspection and Palpation - Tenderness - no tenderness to palpation. ROM - full, painless flexion, extension, lateral bending and rotation. Lower Extremity Right Lower Extremity: Right Knee: Inspection and Palpation: Tenderness - no tenderness to palpation. Left Lower Extremity: Left Hip : Inspection and Palpation: Tenderness - generalized tenderness about the hip and tender to palpation about the groin. Sensation - intact to light touch. Other characteristics - normal warmth. Assessment of pain reveals the following findings::  The pain is characterized as - severe and constant. Location: - pain referred from groin to upper medial thigh. The pain is relieved by - rest. Strength and Tone: Testing limited - due to guarding and due to pain. ROM: Testing limited - due to guarding and due to pain. Left Knee: Inspection and Palpation: Tenderness - no tenderness to palpation. Gait and Station: Evaluation of Gait: Gait Pattern - antalgic. Assistive Devices - no assistive devices.   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 Olin.  Plan is to go home  PCP - Dr. [name]  The patient does not have any contraindications and will receive TXA (tranexamic acid) prior to surgery.  Signed electronically by Alexzandrew L Perkins, III PA-C 

## 2014-01-30 NOTE — Anesthesia Postprocedure Evaluation (Signed)
  Anesthesia Post-op Note  Patient: [name] White  Procedure(s) Performed: Procedure(s) (LRB): LEFT TOTAL HIP ARTHROPLASTY ANTERIOR APPROACH (Left)  Patient Location: PACU  Anesthesia Type: General  Level of Consciousness: awake and alert   Airway and Oxygen Therapy: Patient Spontanous Breathing  Post-op Pain: mild  Post-op Assessment: Post-op Vital signs reviewed, Patient's Cardiovascular Status Stable, Respiratory Function Stable, Patent Airway and No signs of Nausea or vomiting  Last Vitals:  Filed Vitals:   01/30/14 1149  BP: 145/89  Pulse: 78  Temp: 36.9 C  Resp: 12    Post-op Vital Signs: stable   Complications: No apparent anesthesia complications

## 2014-01-30 NOTE — Care Management Note (Signed)
    Page 1 of 1   01/30/2014     3:44:22 PM CARE MANAGEMENT NOTE 01/30/2014  Patient:  [name] White, [name] White   Account Number:  [id number]  Date Initiated:  01/30/2014  Documentation initiated by:  [facility]  Subjective/Objective Assessment:   adm: L total hip arthroplasty anterior approach (LRB)     Action/Plan:   discharge planning   Anticipated DC Date:  01/31/2014   Anticipated DC Plan:  [place]  CM consult      [facility] Choice  HOME HEALTH   Choice offered to / List presented to:  C-1 Patient      DME agency  [place] arranged  HH-2 PT      [place]   Status of service:  Completed, signed off Medicare Important Message given?   (If response is "NO", the following Medicare IM given date fields will be blank) Date Medicare IM given:   Date Additional Medicare IM given:    Discharge Disposition:  [place]  Per UR Regulation:    If discussed at Long Length of Stay Meetings, dates discussed:    Comments:  01/30/14 15:20 CM met in room with pt to offer choice of HHPT.  Pt chooses [place].  Address and contact numbers verified with pt.  Mobile phone number incorrect but pt states Arville Go can reach him at home number on facesheet. Arville Go made aware.  3n1 and rolling walker to be delivered prior to delivery.  No other CM needs were communicated. Mariane Masters, BSN, CM [phone].

## 2014-01-30 NOTE — Evaluation (Signed)
Physical Therapy Evaluation Patient Details Name: [name] White MRN: [id number] DOB: 08/23/1971 Today's Date: 01/30/2014   History of Present Illness  43 yo male s/p L THA-direct anterior 4/28  Clinical Impression  On eval, pt required Min assist for mobility-able to ambulate ~75 feet with walker. Recommend HHPT    Follow Up Recommendations Home health PT    Equipment Recommendations  Rolling walker with 5" wheels    Recommendations for Other Services       Precautions / Restrictions Precautions Precautions: Fall Restrictions Weight Bearing Restrictions: No LLE Weight Bearing: Weight bearing as tolerated      Mobility  Bed Mobility Overal bed mobility: Needs Assistance Bed Mobility: Supine to Sit     Supine to sit: Min assist     General bed mobility comments: Assist for L LE  Transfers Overall transfer level: Needs assistance Equipment used: Rolling walker (2 wheeled) Transfers: Sit to/from Stand Sit to Stand: Min guard         General transfer comment: close guard for safety. VCs safety, hand placement  Ambulation/Gait Ambulation/Gait assistance: Min guard Ambulation Distance (Feet): 75 Feet Assistive device: Rolling walker (2 wheeled) Gait Pattern/deviations: Step-to pattern;Decreased stride length;Decreased step length - left;Antalgic     General Gait Details: VCs safety, technique, sequence, pacing  Stairs            Wheelchair Mobility    Modified Rankin (Stroke Patients Only)       Balance                                             Pertinent Vitals/Pain L LE 5/10.     Home Living Family/patient expects to be discharged to:: Private residence Living Arrangements: Spouse/significant other Available Help at Discharge: Family Type of Home: House Home Access: Stairs to enter Entrance Stairs-Rails: None Secretary/administratorntrance Stairs-Number of Steps: 2 Home Layout: One level Home Equipment: Crutches      Prior  Function Level of Independence: Independent               Hand Dominance        Extremity/Trunk Assessment   Upper Extremity Assessment: Overall WFL for tasks assessed           Lower Extremity Assessment: LLE deficits/detail   LLE Deficits / Details: hip flex 2/5, hip abd/add 2/5, moves ankle well  Cervical / Trunk Assessment: Normal  Communication   Communication: No difficulties  Cognition Arousal/Alertness: Awake/alert Behavior During Therapy: WFL for tasks assessed/performed Overall Cognitive Status: Within Functional Limits for tasks assessed                      General Comments      Exercises        Assessment/Plan    PT Assessment Patient needs continued PT services  PT Diagnosis Difficulty walking;Abnormality of gait;Acute pain   PT Problem List Decreased strength;Decreased range of motion;Decreased activity tolerance;Decreased mobility;Pain;Decreased knowledge of use of DME  PT Treatment Interventions DME instruction;Gait training;Functional mobility training;Therapeutic activities;Stair training;Patient/family education   PT Goals (Current goals can be found in the Care Plan section) Acute Rehab PT Goals Patient Stated Goal: home tomorrow PT Goal Formulation: With patient Time For Goal Achievement: 02/06/14 Potential to Achieve Goals: Good    Frequency 7X/week   Barriers to discharge        Co-evaluation  End of Session Equipment Utilized During Treatment: Gait belt Activity Tolerance: Patient tolerated treatment well Patient left: in chair;with call bell/phone within reach;with family/visitor present           Time: [id number] PT Time Calculation (min): 10 min   Charges:   PT Evaluation $Initial PT Evaluation Tier I: 1 Procedure PT Treatments $Gait Training: 8-22 mins   PT G Codes:          Rebeca AlertJannie Ehan Freas, MPT Pager: [phone]

## 2014-01-30 NOTE — Interval H&P Note (Signed)
History and Physical Interval Note:  01/30/2014 6:57 AM  [name] RedwoodHerbert L Tun  has presented today for surgery, with the diagnosis of left hip pain and avn  The various methods of treatment have been discussed with the patient and family. After consideration of risks, benefits and other options for treatment, the patient has consented to  Procedure(s): LEFT TOTAL HIP ARTHROPLASTY ANTERIOR APPROACH (Left) as a surgical intervention .  The patient's history has been reviewed, patient examined, no change in status, stable for surgery.  I have reviewed the patient's chart and labs.  Questions were answered to the patient's satisfaction.     Shelda PalMatthew D Zachary Nole

## 2014-01-30 NOTE — Discharge Instructions (Signed)
Weightbearing as tolerated. °You may shower but the bandage should remain on.  °Take aspirin 325mg twice daily for prevention of blood clots. °

## 2014-01-30 NOTE — Transfer of Care (Signed)
Immediate Anesthesia Transfer of Care Note  Patient: [name] RedwoodHerbert L White  Procedure(s) Performed: Procedure(s) (LRB): LEFT TOTAL HIP ARTHROPLASTY ANTERIOR APPROACH (Left)  Patient Location: PACU  Anesthesia Type: General  Level of Consciousness: sedated, patient cooperative and responds to stimulation  Airway & Oxygen Therapy: Patient Spontanous Breathing and Patient connected to face mask oxgen  Post-op Assessment: Report given to PACU RN and Post -op Vital signs reviewed and stable  Post vital signs: Reviewed and stable  Complications: No apparent anesthesia complications

## 2014-01-30 NOTE — Anesthesia Preprocedure Evaluation (Addendum)
Anesthesia Evaluation  Patient identified by MRN, date of birth, ID band Patient awake    Reviewed: Allergy & Precautions, H&P , NPO status , Patient's Chart, lab work & pertinent test results  Airway Mallampati: II TM Distance: >3 FB Neck ROM: Full    Dental no notable dental hx.    Pulmonary Current Smoker,  breath sounds clear to auscultation  Pulmonary exam normal       Cardiovascular Exercise Tolerance: Good hypertension, Pt. on medications and Pt. on home beta blockers negative cardio ROS  Rhythm:Regular Rate:Normal     Neuro/Psych PSYCHIATRIC DISORDERS Depression negative neurological ROS     GI/Hepatic Neg liver ROS, GERD-  ,  Endo/Other  negative endocrine ROS  Renal/GU negative Renal ROS  negative genitourinary   Musculoskeletal negative musculoskeletal ROS (+)   Abdominal   Peds negative pediatric ROS (+)  Hematology negative hematology ROS (+)   Anesthesia Other Findings   Reproductive/Obstetrics negative OB ROS                          Anesthesia Physical Anesthesia Plan  ASA: II  Anesthesia Plan: General   Post-op Pain Management:    Induction: Intravenous  Airway Management Planned: Oral ETT  Additional Equipment:   Intra-op Plan:   Post-operative Plan: Extubation in OR  Informed Consent: I have reviewed the patients History and Physical, chart, labs and discussed the procedure including the risks, benefits and alternatives for the proposed anesthesia with the patient or authorized representative who has indicated his/her understanding and acceptance.   Dental advisory given  Plan Discussed with: CRNA  Anesthesia Plan Comments: (Discussed general and spinal. He prefers general. He states he had elevated liver enzymes one month ago and surgery was cancelled. )       Anesthesia Quick Evaluation

## 2014-01-30 NOTE — Op Note (Signed)
NAME:  DIVANTE KOTCH                ACCOUNT NO.: [id number]      MEDICAL RECORD NO.: [id number]      FACILITY:  [facility]      PHYSICIAN:  Shelda Pal  DATE OF BIRTH:  1971-08-18     DATE OF PROCEDURE:  01/30/2014                                 OPERATIVE REPORT         PREOPERATIVE DIAGNOSIS: Left  hip osteonecrosis.      POSTOPERATIVE DIAGNOSIS:  Left hip osteonecrosis.      PROCEDURE:  Left total hip replacement through an anterior approach   utilizing DePuy THR system, component size 52mm pinnacle cup, a size 36+4 neutral   Altrex liner, a size 4Hi Tri Lock stem with a 36+1.5 delta ceramic   ball.      SURGEON:  Madlyn Frankel. Charlann Boxer, M.D.      ASSISTANT:  Leilani Able, PA-C     ANESTHESIA:  General.      SPECIMENS:  None.      COMPLICATIONS:  None.      BLOOD LOSS:  400 cc     DRAINS:  None.      INDICATION OF THE PROCEDURE:  [name] White is a 43 y.o. male who had   presented to office for evaluation of left hip pain.  Radiographs revealed   progressive avascular changes with head collapse left greater than right.  The patient had painful limited range of   motion significantly affecting their overall quality of life.  The patient was failing to    respond to conservative measures, and at this point was ready   to proceed with more definitive measures.  The patient has noted progressive   degenerative changes in his hip, progressive problems and dysfunction   with regarding the hip prior to surgery.  Consent was obtained for   benefit of pain relief.  Specific risk of infection, DVT, component   failure, dislocation, need for revision surgery, as well discussion of   the anterior versus posterior approach were reviewed.  Consent was   obtained for benefit of anterior pain relief through an anterior   approach.      PROCEDURE IN DETAIL:  The patient was brought to operative theater.   Once adequate anesthesia, preoperative antibiotics,  2gm Ancef administered.   The patient was positioned supine on the OSI Hanna table.  Once adequate   padding of boney process was carried out, we had predraped out the hip, and  used fluoroscopy to confirm orientation of the pelvis and position.      The left hip was then prepped and draped from proximal iliac crest to   mid thigh with shower curtain technique.      Time-out was performed identifying the patient, planned procedure, and   extremity.     An incision was then made 2 cm distal and lateral to the   anterior superior iliac spine extending over the orientation of the   tensor fascia lata muscle and sharp dissection was carried down to the   fascia of the muscle and protractor placed in the soft tissues.      The fascia was then incised.  The muscle belly was identified and swept   laterally and  retractor placed along the superior neck.  Following   cauterization of the circumflex vessels and removing some pericapsular   fat, a second cobra retractor was placed on the inferior neck.  A third   retractor was placed on the anterior acetabulum after elevating the   anterior rectus.  A L-capsulotomy was along the line of the   superior neck to the trochanteric fossa, then extended proximally and   distally.  Tag sutures were placed and the retractors were then placed   intracapsular.  We then identified the trochanteric fossa and   orientation of my neck cut, confirmed this radiographically   and then made a neck osteotomy with the femur on traction.  The femoral   head was removed without difficulty or complication.  Traction was let   off and retractors were placed posterior and anterior around the   acetabulum.      The labrum and foveal tissue were debrided.  I began reaming with a 45mm   reamer and reamed up to 51mm reamer with good bony bed preparation and a 52mm   cup was chosen.  The final 52mm Pinnacle cup was then impacted under fluoroscopy  to confirm the depth of  penetration and orientation with respect to   abduction.  A screw was placed followed by the hole eliminator.  The final   36+4 neutral Altrex liner was impacted with good visualized rim fit.  The cup was positioned anatomically within the acetabular portion of the pelvis.      At this point, the femur was rolled at 80 degrees.  Further capsule was   released off the inferior aspect of the femoral neck.  I then   released the superior capsule proximally.  The hook was placed laterally   along the femur and elevated manually and held in position with the bed   hook.  The leg was then extended and adducted with the leg rolled to 100   degrees of external rotation.  Once the proximal femur was fully   exposed, I used a box osteotome to set orientation.  I then began   broaching with the starting chili pepper broach and passed this by hand and then broached up to 4.  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  The broach had excellent proximal fixation. With the 4 broach in place I chose a high offset neck and did a trial reduction.  The offset was appropriate, leg lengths   appeared to be equal, confirmed radiographically.   Given these findings, I went ahead and dislocated the hip, repositioned all   retractors and positioned the right hip in the extended and abducted position.  The final 4 Hi Tri Lock stem was   chosen and it was impacted down to the level of neck cut.  Based on this   and the trial reduction, a 36+1.5 delta ceramic ball was chosen and   impacted onto a clean and dry trunnion, and the hip was reduced.  The   hip had been irrigated throughout the case again at this point.  I did   reapproximate the superior capsular leaflet to the anterior leaflet   using #1 Vicryl.  The fascia of the   tensor fascia lata muscle was then reapproximated using #1 Vicryl and #0 V-lock sutures.  The   remaining wound was closed with 2-0  Vicryl and running 4-0 Monocryl.   The hip  was cleaned, dried, and dressed sterilely using Dermabond and   Aquacel dressing.  He was then brought   to recovery room in stable condition tolerating the procedure well.    Leilani AbleSteve Chabon, PA-C was present for the entirety of the case involved from   preoperative positioning, perioperative retractor management, general   facilitation of the case, as well as primary wound closure as assistant.            Madlyn FrankelMatthew D. Charlann Boxerlin, M.D.        01/30/2014 10:23 AM

## 2014-01-30 NOTE — Interval H&P Note (Signed)
History and Physical Interval Note:  01/30/2014 6:57 AM  [name] White  has presented today for surgery, with the diagnosis of left hip pain and avn  The various methods of treatment have been discussed with the patient and family. After consideration of risks, benefits and other options for treatment, the patient has consented to  Procedure(s): LEFT TOTAL HIP ARTHROPLASTY ANTERIOR APPROACH (Left) as a surgical intervention .  The patient's history has been reviewed, patient examined, no change in status, stable for surgery.  I have reviewed the patient's chart and labs.  Questions were answered to the patient's satisfaction.     Jahad Old D Ugonna Keirsey   

## 2014-01-31 ENCOUNTER — Encounter (HOSPITAL_COMMUNITY): Payer: Self-pay | Admitting: Orthopedic Surgery

## 2014-01-31 LAB — CBC
HCT: 32.8 % — ABNORMAL LOW (ref 39.0–52.0)
Hemoglobin: 11.5 g/dL — ABNORMAL LOW (ref 13.0–17.0)
MCH: 31.7 pg (ref 26.0–34.0)
MCHC: 35.1 g/dL (ref 30.0–36.0)
MCV: 90.4 fL (ref 78.0–100.0)
Platelets: 164 10*3/uL (ref 150–400)
RBC: 3.63 MIL/uL — ABNORMAL LOW (ref 4.22–5.81)
RDW: 12.5 % (ref 11.5–15.5)
WBC: 10.4 10*3/uL (ref 4.0–10.5)

## 2014-01-31 LAB — BASIC METABOLIC PANEL
BUN: 11 mg/dL (ref 6–23)
CO2: 24 mEq/L (ref 19–32)
Calcium: 9.1 mg/dL (ref 8.4–10.5)
Chloride: 105 mEq/L (ref 96–112)
Creatinine, Ser: 0.76 mg/dL (ref 0.50–1.35)
GFR calc Af Amer: >90 mL/min (ref >90)
GFR calc non Af Amer: >90 mL/min (ref >90)
Glucose, Bld: 138 mg/dL — ABNORMAL HIGH (ref 70–99)
Potassium: 4.2 mEq/L (ref 3.7–5.3)
Sodium: 141 mEq/L (ref 137–147)

## 2014-01-31 NOTE — Progress Notes (Signed)
Physical Therapy Treatment Patient Details Name: [name] White MRN: [id number] DOB: 07/01/1971 Today's Date: 01/31/2014    History of Present Illness 43 yo male s/p L THA-direct anterior 4/28    PT Comments    Progressing well with mobility. Completed all education. Practiced ambulation, exercises and stair negotiation. No further questions/concerns from pt/wife. Ready to d/c from PT standpoint.   Follow Up Recommendations  Home health PT     Equipment Recommendations  Rolling walker with 5" wheels    Recommendations for Other Services       Precautions / Restrictions Precautions Precautions: Fall Restrictions Weight Bearing Restrictions: No LLE Weight Bearing: Weight bearing as tolerated    Mobility  Bed Mobility Overal bed mobility: Needs Assistance Bed Mobility: Supine to Sit;Sit to Supine     Supine to sit: Min guard Sit to supine: Min guard   General bed mobility comments: pt uses UE to assist L LE off/onto bed  Transfers Overall transfer level: Needs assistance Equipment used: Rolling walker (2 wheeled) Transfers: Sit to/from Stand Sit to Stand: Min guard         General transfer comment: close guard for safety. VCs safety, hand placement  Ambulation/Gait Ambulation/Gait assistance: Min guard Ambulation Distance (Feet): 115 Feet Assistive device: Rolling walker (2 wheeled) Gait Pattern/deviations: Step-to pattern;Antalgic;Decreased stride length     General Gait Details: VCs safety, technique, sequence, pacing, and for pt to increased WBing on L LE.    Stairs Stairs: Yes Stairs assistance: Min guard Stair Management: Forwards;Step to pattern;With walker Number of Stairs: 1 General stair comments: VCS safety, technique, sequence. close guard for safety.   Wheelchair Mobility    Modified Rankin (Stroke Patients Only)       Balance                                    Cognition Arousal/Alertness: Awake/alert Behavior  During Therapy: WFL for tasks assessed/performed Overall Cognitive Status: Within Functional Limits for tasks assessed                      Exercises Total Joint Exercises Quad Sets: AROM;Both;10 reps;Supine Heel Slides: AAROM;Left;10 reps;Supine Hip ABduction/ADduction: AAROM;Left;10 reps;Supine Long Arc Quad: AROM;Left;10 reps;Seated    General Comments        Pertinent Vitals/Pain 4/10 L hip.     Home Living                      Prior Function            PT Goals (current goals can now be found in the care plan section) Progress towards PT goals: Progressing toward goals    Frequency  7X/week    PT Plan Current plan remains appropriate    Co-evaluation             End of Session Equipment Utilized During Treatment: Gait belt Activity Tolerance: Patient tolerated treatment well Patient left: in bed;with call bell/phone within reach;with family/visitor present     Time: [id number] PT Time Calculation (min): 12 min  Charges:  $Gait Training: 8-22 mins                    G Codes:      Rebeca AlertJannie Mileidy Atkin, MPT Pager: [phone]

## 2014-01-31 NOTE — Progress Notes (Signed)
   Subjective: 1 Day Post-Op Procedure(s) (LRB): LEFT TOTAL HIP ARTHROPLASTY ANTERIOR APPROACH (Left) Patient reports pain as mild.   Patient seen in rounds with Dr. Charlann Boxerlin. Patient is well, and has had no acute complaints or problems. He is doing well. Is ready to go home. No issues overnight. Plan is to go Home after hospital stay.  Objective: Vital signs in last 24 hours: Temp:  [97.6 F (36.4 C)-98.5 F (36.9 C)] 97.9 F (36.6 C) (04/29 0640) Pulse Rate:  [60-80] 60 (04/29 0640) Resp:  [12-18] 16 (04/29 0640) BP: (104-145)/(58-89) 132/67 mmHg (04/29 0640) SpO2:  [93 %-98 %] 98 % (04/29 0640) Weight:  [78.926 kg (174 lb)] 78.926 kg (174 lb) (04/28 1149)  Intake/Output from previous day:  Intake/Output Summary (Last 24 hours) at 01/31/14 0745 Last data filed at 01/31/14 0600  Gross per 24 hour  Intake 3663.75 ml  Output   2905 ml  Net 758.75 ml    Intake/Output this shift:    Labs:  Recent Labs  01/31/14 0502  HGB 11.5*    Recent Labs  01/31/14 0502  WBC 10.4  RBC 3.63*  HCT 32.8*  PLT 164    Recent Labs  01/31/14 0502  NA 141  K 4.2  CL 105  CO2 24  BUN 11  CREATININE 0.76  GLUCOSE 138*  CALCIUM 9.1   EXAM General - Patient is Alert and Oriented Extremity - Neurologically intact Dorsiflexion/Plantar flexion intact Dressing/Incision - clean, dry, no drainage Motor Function - intact, moving foot and toes well on exam.   Past Medical History  Diagnosis Date  . Hypertension   . GERD (gastroesophageal reflux disease)   . Arthritis   . Avascular necrosis   . Depression     Assessment/Plan: 1 Day Post-Op Procedure(s) (LRB): LEFT TOTAL HIP ARTHROPLASTY ANTERIOR APPROACH (Left) Active Problems:   History of total left hip arthroplasty  Estimated body mass index is 25.68 kg/(m^2) as calculated from the following:   Height as of this encounter: 5\' 9"  (1.753 m).   Weight as of this encounter: 78.926 kg (174 lb). Advance diet Up with  therapy D/C IV fluids  DVT Prophylaxis - Aspirin Weight Bearing As Tolerated   Discharge today with home health  Laya Letendre Tamala SerLauren Jaselyn Nahm 01/31/2014, 7:45 AM

## 2014-01-31 NOTE — Progress Notes (Signed)
Utilization review completed.  

## 2014-02-12 NOTE — Discharge Summary (Signed)
Physician Discharge Summary  Patient ID: [name] White Towers MRN: [id number] DOB/AGE: 45/03/1971 45 y.o.  Admit date: 01/30/2014 Discharge date: 01/31/2014   Procedures:  Procedure(s) (LRB): LEFT TOTAL HIP ARTHROPLASTY ANTERIOR APPROACH (Left)  Attending Physician:  Dr. Durene RomansMatthew Olin   Admission Diagnoses:   Left hip AVN / pain  Discharge Diagnoses:  Active Problems:   History of total left hip arthroplasty  Past Medical History  Diagnosis Date  . Hypertension   . GERD (gastroesophageal reflux disease)   . Arthritis   . Avascular necrosis   . Depression     HPI: [name] White Salvato, 43 y.o. male, has a history of pain and functional disability in the left hip(s) due to AVN and patient has failed non-surgical conservative treatments for greater than 12 weeks to include NSAID's and/or analgesics, corticosteriod injections and activity modification. Onset of symptoms was gradual starting in November 2013 with gradually worsening course since that time.The patient noted no past surgery on the left hip(s). Patient currently rates pain in the left hip at 10 out of 10 with activity. Patient has night pain, worsening of pain with activity and weight bearing, trendelenberg gait, pain that interfers with activities of daily living and pain with passive range of motion. Patient has evidence of periarticular osteophytes, joint space narrowing and AVN by imaging studies. This condition presents safety issues increasing the risk of falls. There is no current active infection. Risks, benefits and expectations were discussed with the patient. Risks including but not limited to the risk of anesthesia, blood clots, nerve damage, blood vessel damage, failure of the prosthesis, infection and up to and including death. Patient understand the risks, benefits and expectations and wishes to proceed with surgery.  PCP: Vonita MossRISSMAN,MARK, MD   Discharged Condition: good  Hospital Course:  Patient underwent the above  stated procedure on 01/30/2014. Patient tolerated the procedure well and brought to the recovery room in good condition and subsequently to the floor.  POD #1 BP: 132/67 ; Pulse: 60 ; Temp: 97.9 F (36.6 C) ; Resp: 16  Patient reports pain as mild. Patient seen in rounds with Dr. Charlann Boxerlin. Patient is well, and has had no acute complaints or problems. He is doing well. Is ready to go home. No issues overnight. Plan is to go Home after hospital stay. Neurovascular intact, dorsiflexion/plantar flexion intact, incision: dressing C/D/I, no cellulitis present and compartment soft.   LABS  Basename    HGB  11.5  HCT  32.8    Discharge Exam: General appearance: alert, cooperative and no distress Extremities: Homans sign is negative, no sign of DVT, no edema, redness or tenderness in the calves or thighs and no ulcers, gangrene or trophic changes  Disposition:     Home with follow up in 2 weeks   Follow-up Information   [facility] ([facility])    Contact information:   [address] [place] [address] [phone]       Follow up with [facility]. (3n1 commode, rolling walker)    Contact information:   [address] [place] [address] [phone]       Follow up with Shelda PalLIN,Breeonna Mone D, MD. Schedule an appointment as soon as possible for a visit in 2 weeks.   Specialty:  Orthopedic Surgery   Contact information:   [address] [place] [address] [phone]       Discharge Orders   Future  Orders Complete By Expires   Call MD / Call 911  As directed    Constipation Prevention  As directed    Discharge instructions  As directed    Driving restrictions  As directed    Follow the hip precautions as taught in Physical Therapy  As directed    Increase activity slowly as tolerated  As directed         Medication List         amLODipine 10 MG tablet  Commonly known as:   NORVASC  Take 10 mg by mouth every morning.     DSS 100 MG Caps  Take 100 mg by mouth 2 (two) times daily.     HYDROcodone-acetaminophen 7.5-325 MG per tablet  Commonly known as:  NORCO  Take 1-2 tablets by mouth every 4 (four) hours as needed for moderate pain (breakthrough pain).     methocarbamol 500 MG tablet  Commonly known as:  ROBAXIN  Take 1 tablet (500 mg total) by mouth every 6 (six) hours as needed for muscle spasms.     metoprolol succinate 50 MG 24 hr tablet  Commonly known as:  TOPROL-XL  Take 50 mg by mouth every morning. Take with or immediately following a meal.     multivitamin with minerals Tabs tablet  Take 1 tablet by mouth daily.     polyethylene glycol packet  Commonly known as:  MIRALAX / GLYCOLAX  Take 17 g by mouth 2 (two) times daily.     venlafaxine XR 37.5 MG 24 hr capsule  Commonly known as:  EFFEXOR-XR  Take 37.5 mg by mouth daily with breakfast.     vitamin C 500 MG tablet  Commonly known as:  ASCORBIC ACID  Take 500 mg by mouth daily.         Signed: Anastasio AuerbachMatthew S. Layaan Mott   PAC  02/12/2014, 10:05 AM

## 2014-12-10 ENCOUNTER — Emergency Department: Payer: Self-pay | Admitting: Emergency Medicine

## 2015-02-03 NOTE — Consult Note (Signed)
PATIENT NAME:  [name] White, [name] White MR#:  [id number] DATE OF BIRTH:  19-Sep-1971  DATE OF CONSULTATION:  12/10/2014  REFERRING PHYSICIAN:   CONSULTING PHYSICIAN:  [name] Amel, MD   IDENTIFYING INFORMATION AND REASON FOR CONSULT:  This is a 44 year old man who presents to the Emergency Room.   CHIEF COMPLAINT: "I want to stop drinking."   HISTORY OF PRESENT ILLNESS: Information from the patient and the chart. The patient reports that he has been drinking heavily for about a year. He had been drinking for a while before that, but it has really been bad for the last year.   He estimates his current consumption is being about 1/2 gallon of liquor a day. Last use was early yesterday.  He says that he realizes that it is causing major problems for him with his marriage and his work.  Making him feel sick as well.  His mood is anxious, but he denies any suicidal ideation.  He denies that he is abusing any other drugs. Denies any psychotic symptoms. Currently feeling very shaky all over.  Feels twitchy.  Has not been able to eat or sleep well for days.  Mood has been irritable recently, but not violent. No hallucinations. He is on medications prescribed by his primary care doctor which appear to be stable.   PAST PSYCHIATRIC HISTORY: He says he has not been seen by a psychiatrist, but has been prescribed antidepressants by his primary care doctor. Current medicine appears to be a low dose of venlafaxine. Denies any history of suicide attempts or violence. No psychiatric hospitalizations.   PAST MEDICAL HISTORY: The patient has high blood pressure. No history of heart attacks or strokes. He had a hip replacement in the past. No other ongoing medical problems identified.   SOCIAL HISTORY: The patient is married. He has 5 children counting stepchildren.  The youngest 2 seem to be bringing stress into his life.  The 46 year old is allegedly using heroin. 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   FAMILY HISTORY: No known family history of mental health or substance abuse problems.   CURRENT MEDICATIONS: The patient just knew that he was on an antidepressant and blood pressure medicine. According to the reconciliation, he is on amlodipine 5 mg a day and Effexor 37.5 mg per day.   ALLERGIES: LISINOPRIL.   REVIEW OF SYSTEMS:  Feeling shaky. A little bit sick to his stomach, but not about to throw up.  Poor appetite. Poor p.o. intake.  Poor sleep recently. No suicidal or homicidal ideation.   MENTAL STATUS EXAMINATION: Adequately groomed gentleman who looks his stated age, cooperative with the interview. Looks pretty uncomfortable. He is twitching all over intermittently throughout the interview.  Eye contact decreased.  Psychomotor activity twitchy. Speech is quiet, but easy to understand. Normal rate. Affect is anxious. Mood stated as nervous. Thoughts are overall lucid a little bit slow. No evidence of delusions or loosening of associations. Denies suicidal or homicidal ideation. Can repeat 3 words immediately, remembers all 3 at 3 minutes. He is alert and oriented x 4. Judgment and insight appear to be intact.   LABORATORY RESULTS: Drug screen is all negative.  Urinalysis is 1+ blood, large amounts of protein.  CBC normal.  Chemistry show elevated glucose of 149, low sodium 134, low potassium 2.6, low chloride 95. Elevated alkaline phosphatase 236, elevated ALT 65. Elevated AST 174.  Alcohol level negative, acetaminophen and salicylates negative.   VITAL  SIGNS: Blood pressure 151/90, respirations 18, pulse 99, temperature 98.3.   ASSESSMENT: A 44 year old man with alcohol abuse currently having significant symptoms subjectively and objectively of alcohol withdrawal.  Requires hospitalization really for safe treatment of alcohol withdrawal at this point.  Not suicidal and really not depressed.  It is not clear whether he should be better served on medicine or psychiatry.    TREATMENT PLAN: I have ordered oral Librium as well as IM thiamine and IM Ativan for him.  It already looks like he is refusing the thiamine and the Ativan, but took the Librium. We will discuss with Emergency Room doctor about appropriate disposition.  Needs detoxification protocol, which has been ordered.  Continue outpatient medicine.  I think the low dose of the Effexor is probably not the best choice and I have changed him to Prozac 20 mg a day for depression.   DIAGNOSIS, PRINCIPAL AND PRIMARY:  AXIS I: Alcohol abuse, severe.   SECONDARY DIAGNOSES:  AXIS I: Depression, not otherwise specified.     AXIS II: Deferred.  AXIS III: Alcohol withdrawal, high blood pressure, rule out chronic pain.     ____________________________ [name] AmelJohn T. Clapacs, MD jtc:DT D: 12/10/2014 15:04:26 ET T: 12/10/2014 15:27:54 ET JOB#: [id number]  cc: [name] AmelJohn T. Clapacs, MD, <Dictator> [name] AmelJOHN T CLAPACS MD ELECTRONICALLY SIGNED 12/11/2014 10:41

## 2015-04-10 ENCOUNTER — Other Ambulatory Visit: Payer: Self-pay

## 2015-04-10 MED ORDER — OMEPRAZOLE 20 MG PO CPDR: 20.0000 mg | DELAYED_RELEASE_CAPSULE | Freq: Every day | ORAL | Status: DC | PRN

## 2015-04-29 ENCOUNTER — Telehealth: Payer: Self-pay | Admitting: Family Medicine

## 2015-04-29 MED ORDER — VENLAFAXINE HCL ER 37.5 MG PO CP24: 37.5000 mg | ORAL_CAPSULE | Freq: Every day | ORAL | Status: DC

## 2015-04-29 NOTE — Telephone Encounter (Signed)
PTS WIFE CALLED TO SCHEDULE APPT FOR MED REFILL BUT THE NEXT APPT ISNT UNTIL 8/12 AND SHE WANTED TO KNOW VENLAFAXINE COULD BE WRITTEN SO HE HAS ENOUGH TO LAST HIM UNTIL HIS APPT. . SHE WOULD LIKE IT TO GO TO [place]

## 2015-05-01 ENCOUNTER — Other Ambulatory Visit: Payer: Self-pay | Admitting: Family Medicine

## 2015-05-01 NOTE — Telephone Encounter (Signed)
Routing to provider  

## 2015-05-01 NOTE — Telephone Encounter (Signed)
Please contact patient We had him on a higher dose last time (100 mg), but I never got to see him back for follow-up (March) The request we got was for 75 mg Please find out how much he is taking and what his pressures are before I send the wrong strength

## 2015-05-01 NOTE — Telephone Encounter (Signed)
No answer on cell number. 

## 2015-05-01 NOTE — Telephone Encounter (Signed)
Routine back; please see my questions and give him a call; thanks

## 2015-05-01 NOTE — Telephone Encounter (Signed)
Home number says no voicemail set up.

## 2015-05-02 MED ORDER — METOPROLOL SUCCINATE ER 50 MG PO TB24: ORAL_TABLET | ORAL | Status: DC

## 2015-05-02 NOTE — Telephone Encounter (Signed)
I spoke with patient, he says he is doing the . He has been out since yesterday. He has not been checking his BP.

## 2015-05-07 DIAGNOSIS — Z87898 Personal history of other specified conditions: Secondary | ICD-10-CM | POA: Insufficient documentation

## 2015-05-07 DIAGNOSIS — F419 Anxiety disorder, unspecified: Secondary | ICD-10-CM | POA: Insufficient documentation

## 2015-05-07 DIAGNOSIS — K219 Gastro-esophageal reflux disease without esophagitis: Secondary | ICD-10-CM | POA: Insufficient documentation

## 2015-05-07 DIAGNOSIS — F1010 Alcohol abuse, uncomplicated: Secondary | ICD-10-CM | POA: Insufficient documentation

## 2015-05-07 DIAGNOSIS — F329 Major depressive disorder, single episode, unspecified: Secondary | ICD-10-CM | POA: Insufficient documentation

## 2015-05-07 DIAGNOSIS — E785 Hyperlipidemia, unspecified: Secondary | ICD-10-CM | POA: Insufficient documentation

## 2015-05-07 DIAGNOSIS — I10 Essential (primary) hypertension: Secondary | ICD-10-CM | POA: Insufficient documentation

## 2015-05-07 DIAGNOSIS — F32A Depression, unspecified: Secondary | ICD-10-CM | POA: Insufficient documentation

## 2015-05-07 DIAGNOSIS — F1011 Alcohol abuse, in remission: Secondary | ICD-10-CM | POA: Insufficient documentation

## 2015-05-07 DIAGNOSIS — R7301 Impaired fasting glucose: Secondary | ICD-10-CM | POA: Insufficient documentation

## 2015-05-07 DIAGNOSIS — E663 Overweight: Secondary | ICD-10-CM | POA: Insufficient documentation

## 2015-05-17 ENCOUNTER — Ambulatory Visit (INDEPENDENT_AMBULATORY_CARE_PROVIDER_SITE_OTHER): Payer: 59 | Admitting: Family Medicine

## 2015-05-17 ENCOUNTER — Encounter: Payer: Self-pay | Admitting: Family Medicine

## 2015-05-17 VITALS — BP 161/100 | HR 90 | Temp 98.7°F | Resp 20 | Ht 69.0 in | Wt 175.8 lb

## 2015-05-17 DIAGNOSIS — R945 Abnormal results of liver function studies: Secondary | ICD-10-CM

## 2015-05-17 DIAGNOSIS — R7301 Impaired fasting glucose: Secondary | ICD-10-CM | POA: Diagnosis not present

## 2015-05-17 DIAGNOSIS — K219 Gastro-esophageal reflux disease without esophagitis: Secondary | ICD-10-CM

## 2015-05-17 DIAGNOSIS — R748 Abnormal levels of other serum enzymes: Secondary | ICD-10-CM | POA: Insufficient documentation

## 2015-05-17 DIAGNOSIS — R7989 Other specified abnormal findings of blood chemistry: Secondary | ICD-10-CM | POA: Diagnosis not present

## 2015-05-17 DIAGNOSIS — F1010 Alcohol abuse, uncomplicated: Secondary | ICD-10-CM | POA: Diagnosis not present

## 2015-05-17 DIAGNOSIS — I10 Essential (primary) hypertension: Secondary | ICD-10-CM | POA: Diagnosis not present

## 2015-05-17 DIAGNOSIS — K760 Fatty (change of) liver, not elsewhere classified: Secondary | ICD-10-CM

## 2015-05-17 MED ORDER — VENLAFAXINE HCL ER 37.5 MG PO CP24: 37.5000 mg | ORAL_CAPSULE | Freq: Every day | ORAL | Status: DC

## 2015-05-17 MED ORDER — AMLODIPINE BESYLATE 10 MG PO TABS: 10.0000 mg | ORAL_TABLET | Freq: Every morning | ORAL | Status: DC

## 2015-05-17 MED ORDER — MUPIROCIN CALCIUM 2 % EX CREA: 1.0000 "application " | TOPICAL_CREAM | Freq: Two times a day (BID) | CUTANEOUS | Status: DC

## 2015-05-17 MED ORDER — OMEPRAZOLE 20 MG PO CPDR: 20.0000 mg | DELAYED_RELEASE_CAPSULE | Freq: Every day | ORAL | Status: DC | PRN

## 2015-05-17 MED ORDER — METOPROLOL SUCCINATE ER 100 MG PO TB24: 100.0000 mg | ORAL_TABLET | Freq: Every day | ORAL | Status: DC

## 2015-05-17 NOTE — Progress Notes (Signed)
BP 161/100 mmHg  Pulse 90  Temp(Src) 98.7 F (37.1 C) (Tympanic)  Resp 20  Ht 5\' 9"  (1.753 m)  Wt 175 lb 12.8 oz (79.742 kg)  BMI 25.95 kg/m2  SpO2 98%   Subjective:    Patient ID: [name] White, male    DOB: [birth year], 44 y.o.   MRN: [id number]  HPI: [name] White is a 44 y.o. male  Chief Complaint  Patient presents with  . Medication Refill   He is doing better in terms of EtOH; still drinking from time to time but nothing like what he was doing; he did have a half gallon of liquor though last week; stressful events cause him to drink; was in rehab earlier this year; had quite high liver enzymes; AA occasionally, not regularly like he knows he should He can tell when his BP is high; he can feel his heart beating in his head when it's high; ate [employer] last night, shouldn't have he admits He does not check his BP at home PPI is helping his heartburn/reflux Drinking plenty of liquids, working outdoors landscaping; 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 Interim medical history since our last visit reviewed. Allergies and medications reviewed and updated.  Review of Systems Per HPI unless specifically indicated above     Objective:    BP 161/100 mmHg  Pulse 90  Temp(Src) 98.7 F (37.1 C) (Tympanic)  Resp 20  Ht 5\' 9"  (1.753 m)  Wt 175 lb 12.8 oz (79.742 kg)  BMI 25.95 kg/m2  SpO2 98%  Wt Readings from Last 3 Encounters:  05/17/15 175 lb 12.8 oz (79.742 kg)  12/17/14 169 lb (76.658 kg)  01/30/14 174 lb (78.926 kg)    Physical Exam  Constitutional: He appears well-developed and well-nourished. No distress.  HENT:  Head: Normocephalic and atraumatic.  Mouth/Throat: Mucous membranes are normal.  Eyes: EOM are normal. No scleral icterus.  Neck: No thyromegaly present.  Cardiovascular: Normal rate and regular rhythm.    Pulmonary/Chest: Effort normal and breath sounds normal.  Abdominal: Soft. Bowel sounds are normal. He exhibits no distension. There is no hepatomegaly. There is no tenderness.  Musculoskeletal: He exhibits no edema.  Neurological: He displays no tremor. Coordination normal.  Skin: Skin is warm and dry. No pallor.  Very tanned  Psychiatric: He has a normal mood and affect. His speech is normal and behavior is normal. Judgment and thought content normal.      Assessment & Plan:   Problem List Items Addressed This Visit      Cardiovascular and Mediastinum   Hypertension - Primary    Not controlled; smoking playing a role, encouraged him to quit; increase metoprolol to 100 mg daily; continue the amlodipine; next med I would add is HCTZ but we discussed risk of gout and drinking; really encouraged DASH guidelines      Relevant Medications   metoprolol succinate (TOPROL-XL) 100 MG 24 hr tablet   amLODipine (NORVASC) 10 MG tablet     Digestive   GERD (gastroesophageal reflux disease)    Avoid triggers; continue the PPI, but caution given about daily use and risk of anemia, bone loss      Relevant Medications   omeprazole (PRILOSEC) 20 MG capsule   Fatty liver    US done in 2015; likely caused by alcohol intake        Endocrine  IFG (impaired fasting glucose)    Check a1C today      Relevant Orders   Hgb A1c w/o eAG (Completed)   Lipid Panel w/o Chol/HDL Ratio (Completed)     Other   Alcohol abuse, episodic    Unfortunately, patient continues to drink, though not to the excess as previously; check liver enzymes today; AA is helpful for many; alcohol likely complicating his hypertension and GERD and IFG      Elevated serum GGT level   Relevant Orders   Gamma GT (Completed)   Elevated liver function tests    Likely secondary to his alcohol use; last year, [place] and hepatitis labs were done      Relevant Orders   Comprehensive metabolic panel (Completed)      Follow  up plan: Return in about 10 days (around 05/27/2015) for BP recheck with CMA.

## 2015-05-17 NOTE — Assessment & Plan Note (Signed)
Not controlled; smoking playing a role, encouraged him to quit; increase metoprolol to 100 mg daily; continue the amlodipine; next med I would add is HCTZ but we discussed risk of gout and drinking; really encouraged DASH guidelines

## 2015-05-17 NOTE — Patient Instructions (Addendum)
Avoid triggers that worsen reflux and heartburn Increase your metoprolol to 100 mg daily Continue the amlodipine Really really try to quit smoking Try to follow the DASH guidelines Return to see Amy in 10 days for blood pressure and pulse recheck; we'll add another medicine then if needed    Gastroesophageal Reflux Disease, Adult Gastroesophageal reflux disease (GERD) happens when acid from your stomach flows up into the esophagus. When acid comes in contact with the esophagus, the acid causes soreness (inflammation) in the esophagus. Over time, GERD may create small holes (ulcers) in the lining of the esophagus. CAUSES   Increased body weight. This puts pressure on the stomach, making acid rise from the stomach into the esophagus.  Smoking. This increases acid production in the stomach.  Drinking alcohol. This causes decreased pressure in the lower esophageal sphincter (valve or ring of muscle between the esophagus and stomach), allowing acid from the stomach into the esophagus.  Late evening meals and a full stomach. This increases pressure and acid production in the stomach.  A malformed lower esophageal sphincter. Sometimes, no cause is found. SYMPTOMS   Burning pain in the lower part of the mid-chest behind the breastbone and in the mid-stomach area. This may occur twice a week or more often.  Trouble swallowing.  Sore throat.  Dry cough.  Asthma-like symptoms including chest tightness, shortness of breath, or wheezing. DIAGNOSIS  Your caregiver may be able to diagnose GERD based on your symptoms. In some cases, X-rays and other tests may be done to check for complications or to check the condition of your stomach and esophagus. TREATMENT  Your caregiver may recommend over-the-counter or prescription medicines to help decrease acid production. Ask your caregiver before starting or adding any new medicines.  HOME CARE INSTRUCTIONS   Change the factors that you can control.  Ask your caregiver for guidance concerning weight loss, quitting smoking, and alcohol consumption.  Avoid foods and drinks that make your symptoms worse, such as:  Caffeine or alcoholic drinks.  Chocolate.  Peppermint or mint flavorings.  Garlic and onions.  Spicy foods.  Citrus fruits, such as oranges, lemons, or limes.  Tomato-based foods such as sauce, chili, salsa, and pizza.  Fried and fatty foods.  Avoid lying down for the 3 hours prior to your bedtime or prior to taking a nap.  Eat small, frequent meals instead of large meals.  Wear loose-fitting clothing. Do not wear anything tight around your waist that causes pressure on your stomach.  Raise the head of your bed 6 to 8 inches with wood blocks to help you sleep. Extra pillows will not help.  Only take over-the-counter or prescription medicines for pain, discomfort, or fever as directed by your caregiver.  Do not take aspirin, ibuprofen, or other nonsteroidal anti-inflammatory drugs (NSAIDs). SEEK IMMEDIATE MEDICAL CARE IF:   You have pain in your arms, neck, jaw, teeth, or back.  Your pain increases or changes in intensity or duration.  You develop nausea, vomiting, or sweating (diaphoresis).  You develop shortness of breath, or you faint.  Your vomit is green, yellow, black, or looks like coffee grounds or blood.  Your stool is red, bloody, or black. These symptoms could be signs of other problems, such as heart disease, gastric bleeding, or esophageal bleeding. MAKE SURE YOU:   Understand these instructions.  Will watch your condition.  Will get help right away if you are not doing well or get worse. Document Released: 07/01/2005 Document Revised: 12/14/2011 Document Reviewed:  04/10/2011 [employer] Patient Information 2015 [place], [place]. This information is not intended to replace advice given to you by your health care provider. Make sure you discuss any questions you have with your health care  provider. DASH Eating Plan DASH stands for "Dietary Approaches to Stop Hypertension." The DASH eating plan is a healthy eating plan that has been shown to reduce high blood pressure (hypertension). Additional health benefits may include reducing the risk of type 2 diabetes mellitus, heart disease, and stroke. The DASH eating plan may also help with weight loss. WHAT DO I NEED TO KNOW ABOUT THE DASH EATING PLAN? For the DASH eating plan, you will follow these general guidelines:  Choose foods with a percent daily value for sodium of less than 5% (as listed on the food label).  Use salt-free seasonings or herbs instead of table salt or sea salt.  Check with your health care provider or pharmacist before using salt substitutes.  Eat lower-sodium products, often labeled as "lower sodium" or "no salt added."  Eat fresh foods.  Eat more vegetables, fruits, and low-fat dairy products.  Choose whole grains. Look for the word "whole" as the first word in the ingredient list.  Choose fish and skinless chicken or [place] more often than red meat. Limit fish, poultry, and meat to 6 oz (170 g) each day.  Limit sweets, desserts, sugars, and sugary drinks.  Choose heart-healthy fats.  Limit cheese to 1 oz (28 g) per day.  Eat more home-cooked food and less restaurant, buffet, and fast food.  Limit fried foods.  Cook foods using methods other than frying.  Limit canned vegetables. If you do use them, rinse them well to decrease the sodium.  When eating at a restaurant, ask that your food be prepared with less salt, or no salt if possible. WHAT FOODS CAN I EAT? Seek help from a dietitian for individual calorie needs. Grains Whole grain or whole wheat bread. Brown rice. Whole grain or whole wheat pasta. Quinoa, bulgur, and whole grain cereals. Low-sodium cereals. Corn or whole wheat flour tortillas. Whole grain cornbread. Whole grain crackers. Low-sodium crackers. Vegetables Fresh or frozen  vegetables (raw, steamed, roasted, or grilled). Low-sodium or reduced-sodium tomato and vegetable juices. Low-sodium or reduced-sodium tomato sauce and paste. Low-sodium or reduced-sodium canned vegetables.  Fruits All fresh, canned (in natural juice), or frozen fruits. Meat and Other Protein Products Ground beef (85% or leaner), grass-fed beef, or beef trimmed of fat. Skinless chicken or [place]. Ground chicken or [place]. Pork trimmed of fat. All fish and seafood. Eggs. Dried beans, peas, or lentils. Unsalted nuts and seeds. Unsalted canned beans. Dairy Low-fat dairy products, such as skim or 1% milk, 2% or reduced-fat cheeses, low-fat ricotta or cottage cheese, or plain low-fat yogurt. Low-sodium or reduced-sodium cheeses. Fats and Oils Tub margarines without trans fats. Light or reduced-fat mayonnaise and salad dressings (reduced sodium). Avocado. Safflower, olive, or canola oils. Natural peanut or almond butter. Other Unsalted popcorn and pretzels. The items listed above may not be a complete list of recommended foods or beverages. Contact your dietitian for more options. WHAT FOODS ARE NOT RECOMMENDED? Grains White bread. White pasta. White rice. Refined cornbread. Bagels and croissants. Crackers that contain trans fat. Vegetables Creamed or fried vegetables. Vegetables in a cheese sauce. Regular canned vegetables. Regular canned tomato sauce and paste. Regular tomato and vegetable juices. Fruits Dried fruits. Canned fruit in light or heavy syrup. Fruit juice. Meat and Other Protein Products Fatty cuts of meat. Ribs, chicken  wings, bacon, sausage, bologna, salami, chitterlings, fatback, hot dogs, bratwurst, and packaged luncheon meats. Salted nuts and seeds. Canned beans with salt. Dairy Whole or 2% milk, cream, half-and-half, and cream cheese. Whole-fat or sweetened yogurt. Full-fat cheeses or blue cheese. Nondairy creamers and whipped toppings. Processed cheese, cheese spreads, or cheese  curds. Condiments Onion and garlic salt, seasoned salt, table salt, and sea salt. Canned and packaged gravies. Worcestershire sauce. Tartar sauce. Barbecue sauce. Teriyaki sauce. Soy sauce, including reduced sodium. Steak sauce. Fish sauce. Oyster sauce. Cocktail sauce. Horseradish. Ketchup and mustard. Meat flavorings and tenderizers. Bouillon cubes. Hot sauce. Tabasco sauce. Marinades. Taco seasonings. Relishes. Fats and Oils Butter, stick margarine, lard, shortening, ghee, and bacon fat. Coconut, palm kernel, or palm oils. Regular salad dressings. Other Pickles and olives. Salted popcorn and pretzels. The items listed above may not be a complete list of foods and beverages to avoid. Contact your dietitian for more information. WHERE CAN I FIND MORE INFORMATION? National Heart, Lung, and Blood Institute: [employer] Document Released: 09/10/2011 Document Revised: 02/05/2014 Document Reviewed: 07/26/2013 [facility] Patient Information 2015 [place], [place]. This information is not intended to replace advice given to you by your health care provider. Make sure you discuss any questions you have with your health care provider.

## 2015-05-17 NOTE — Assessment & Plan Note (Signed)
Avoid triggers; continue the PPI, but caution given about daily use and risk of anemia, bone loss

## 2015-05-17 NOTE — Assessment & Plan Note (Signed)
Check a1C today

## 2015-05-18 ENCOUNTER — Encounter: Payer: Self-pay | Admitting: Family Medicine

## 2015-05-18 LAB — HGB A1C W/O EAG: Hgb A1c MFr Bld: 6 % — ABNORMAL HIGH (ref 4.8–5.6)

## 2015-05-18 LAB — COMPREHENSIVE METABOLIC PANEL
ALT: 40 IU/L (ref 0–44)
AST: 41 IU/L — ABNORMAL HIGH (ref 0–40)
Albumin/Globulin Ratio: 1.9 (ref 1.1–2.5)
Albumin: 5 g/dL (ref 3.5–5.5)
Alkaline Phosphatase: 144 IU/L — ABNORMAL HIGH (ref 39–117)
BUN/Creatinine Ratio: 13 (ref 9–20)
BUN: 11 mg/dL (ref 6–24)
Bilirubin Total: 0.7 mg/dL (ref 0.0–1.2)
CO2: 23 mmol/L (ref 18–29)
Calcium: 10.1 mg/dL (ref 8.7–10.2)
Chloride: 93 mmol/L — ABNORMAL LOW (ref 97–108)
Creatinine, Ser: 0.87 mg/dL (ref 0.76–1.27)
GFR calc Af Amer: 120 mL/min/{1.73_m2} (ref >59)
GFR calc non Af Amer: 103 mL/min/{1.73_m2} (ref >59)
Globulin, Total: 2.7 g/dL (ref 1.5–4.5)
Glucose: 101 mg/dL — ABNORMAL HIGH (ref 65–99)
Potassium: 3.4 mmol/L — ABNORMAL LOW (ref 3.5–5.2)
Sodium: 139 mmol/L (ref 134–144)
Total Protein: 7.7 g/dL (ref 6.0–8.5)

## 2015-05-18 LAB — LIPID PANEL W/O CHOL/HDL RATIO
Cholesterol, Total: 249 mg/dL — ABNORMAL HIGH (ref 100–199)
HDL: 71 mg/dL (ref >39)
LDL Calculated: 101 mg/dL — ABNORMAL HIGH (ref 0–99)
Triglycerides: 384 mg/dL — ABNORMAL HIGH (ref 0–149)
VLDL Cholesterol Cal: 77 mg/dL — ABNORMAL HIGH (ref 5–40)

## 2015-05-18 LAB — GAMMA GT: GGT: 281 IU/L — ABNORMAL HIGH (ref 0–65)

## 2015-05-19 DIAGNOSIS — R945 Abnormal results of liver function studies: Secondary | ICD-10-CM

## 2015-05-19 DIAGNOSIS — R7989 Other specified abnormal findings of blood chemistry: Secondary | ICD-10-CM | POA: Insufficient documentation

## 2015-05-19 DIAGNOSIS — K760 Fatty (change of) liver, not elsewhere classified: Secondary | ICD-10-CM | POA: Insufficient documentation

## 2015-05-19 NOTE — Assessment & Plan Note (Signed)
US done in 2015; likely caused by alcohol intake

## 2015-05-19 NOTE — Assessment & Plan Note (Signed)
Likely secondary to his alcohol use; last year, [place] and hepatitis labs were done

## 2015-05-19 NOTE — Assessment & Plan Note (Signed)
Unfortunately, patient continues to drink, though not to the excess as previously; check liver enzymes today; AA is helpful for many; alcohol likely complicating his hypertension and GERD and IFG

## 2015-07-12 ENCOUNTER — Ambulatory Visit: Payer: 59 | Admitting: Family Medicine

## 2015-08-27 ENCOUNTER — Encounter: Payer: Self-pay | Admitting: Family Medicine

## 2015-10-25 ENCOUNTER — Telehealth: Payer: Self-pay | Admitting: Family Medicine

## 2015-10-25 NOTE — Telephone Encounter (Signed)
Pt needs letter from Dr Sherie Don stating he can drive.  I was a little confused about all of pieces of this; DMV states he needed to have a physical but they have him under the wrong class of license so I am really not sure where we come into play.  Patient would like Dr Sherie Don or her assistant to call him so we can get everything in order so that he can drive.

## 2015-10-25 NOTE — Telephone Encounter (Signed)
He needs an appt please; he's not been seen for almost 6 months

## 2015-10-25 NOTE — Telephone Encounter (Signed)
Routing to provider  

## 2015-10-28 NOTE — Telephone Encounter (Signed)
I'm not sure what's going on, but we can either do a complete physical OR do a visit for whatever issue he seems to be having (dizziness? Accident? Blood sugars? Seizures?) I am not comfortable writing a letter Perhaps administrative staff can write a letter saying he has an appt It sounds like he will need to do his physical at another visit if he has a problem to talk about on Feb 1st

## 2015-10-28 NOTE — Telephone Encounter (Signed)
Patient did schedule a CPE for you on 11/06/15. He needs a brief letter stating that he has an appointment with you to evaluate his driving abilities. Patient states it must be from the doctor.  Fax note to Miss Gatling at [phone].

## 2015-10-29 ENCOUNTER — Telehealth: Payer: Self-pay | Admitting: Family Medicine

## 2015-10-29 NOTE — Telephone Encounter (Signed)
Left message on patient's voicemail.

## 2015-10-29 NOTE — Telephone Encounter (Signed)
Pt called stated he would like a call back from Dr. Sherie Don no further information was provided. Thanks.

## 2015-10-30 NOTE — Telephone Encounter (Deleted)
Routing to provider. I'm

## 2015-10-30 NOTE — Telephone Encounter (Signed)
Laurelyn Sickle, please see this message and the prior one that was also sent to you in regards to him.

## 2015-10-30 NOTE — Telephone Encounter (Signed)
Routing to provider. I left detailed message on his voicemail yesterday in regards to the letter he requested.

## 2015-10-30 NOTE — Telephone Encounter (Signed)
Please see other note I sent to [facility]; perhaps an Production designer, theatre/television/film can write a letter saying he has an appt; I'll be glad to see him and address at appt

## 2015-11-06 ENCOUNTER — Encounter: Payer: Self-pay | Admitting: Family Medicine

## 2015-11-06 ENCOUNTER — Telehealth: Payer: Self-pay

## 2015-11-06 ENCOUNTER — Ambulatory Visit (INDEPENDENT_AMBULATORY_CARE_PROVIDER_SITE_OTHER): Payer: 59 | Admitting: Family Medicine

## 2015-11-06 VITALS — BP 143/85 | HR 65 | Temp 98.1°F | Ht 68.0 in | Wt 166.0 lb

## 2015-11-06 DIAGNOSIS — F1010 Alcohol abuse, uncomplicated: Secondary | ICD-10-CM | POA: Diagnosis not present

## 2015-11-06 DIAGNOSIS — F419 Anxiety disorder, unspecified: Secondary | ICD-10-CM

## 2015-11-06 DIAGNOSIS — K760 Fatty (change of) liver, not elsewhere classified: Secondary | ICD-10-CM | POA: Diagnosis not present

## 2015-11-06 DIAGNOSIS — I10 Essential (primary) hypertension: Secondary | ICD-10-CM

## 2015-11-06 DIAGNOSIS — R7989 Other specified abnormal findings of blood chemistry: Secondary | ICD-10-CM

## 2015-11-06 DIAGNOSIS — R0683 Snoring: Secondary | ICD-10-CM | POA: Diagnosis not present

## 2015-11-06 DIAGNOSIS — R7301 Impaired fasting glucose: Secondary | ICD-10-CM

## 2015-11-06 DIAGNOSIS — R945 Abnormal results of liver function studies: Secondary | ICD-10-CM

## 2015-11-06 DIAGNOSIS — R1013 Epigastric pain: Secondary | ICD-10-CM | POA: Diagnosis not present

## 2015-11-06 LAB — CBC WITH DIFFERENTIAL/PLATELET
Hematocrit: 43.9 % (ref 37.5–51.0)
Hemoglobin: 16 g/dL (ref 12.6–17.7)
Lymphocytes Absolute: 1 10*3/uL (ref 0.7–3.1)
Lymphs: 14 %
MCH: 33.1 pg — ABNORMAL HIGH (ref 26.6–33.0)
MCHC: 36.4 g/dL — ABNORMAL HIGH (ref 31.5–35.7)
MCV: 91 fL (ref 79–97)
MID (Absolute): 0.2 10*3/uL (ref 0.1–1.6)
MID: 2 %
Neutrophils Absolute: 6.2 10*3/uL (ref 1.4–7.0)
Neutrophils: 84 %
Platelets: 169 10*3/uL (ref 150–379)
RBC: 4.84 x10E6/uL (ref 4.14–5.80)
RDW: 13.2 % (ref 12.3–15.4)
WBC: 7.4 10*3/uL (ref 3.4–10.8)

## 2015-11-06 LAB — AST (SGOT) PICCOLO, WAIVED: AST (SGOT) Piccolo, Waived: 70 U/L — ABNORMAL HIGH (ref 11–38)

## 2015-11-06 LAB — ALT (SGPT) PICCOLO, WAIVED: ALT (SGPT) Piccolo, Waived: 45 U/L (ref 10–47)

## 2015-11-06 MED ORDER — AMLODIPINE BESYLATE 10 MG PO TABS: 10.0000 mg | ORAL_TABLET | Freq: Every morning | ORAL | Status: DC

## 2015-11-06 MED ORDER — SUCRALFATE 1 G PO TABS: 1.0000 g | ORAL_TABLET | Freq: Three times a day (TID) | ORAL | Status: DC

## 2015-11-06 MED ORDER — METOPROLOL SUCCINATE ER 100 MG PO TB24: 100.0000 mg | ORAL_TABLET | Freq: Every day | ORAL | Status: DC

## 2015-11-06 MED ORDER — VENLAFAXINE HCL ER 37.5 MG PO CP24: 37.5000 mg | ORAL_CAPSULE | Freq: Every day | ORAL | Status: DC

## 2015-11-06 MED ORDER — PANTOPRAZOLE SODIUM 40 MG PO TBEC: 40.0000 mg | DELAYED_RELEASE_TABLET | Freq: Two times a day (BID) | ORAL | Status: DC

## 2015-11-06 NOTE — Assessment & Plan Note (Signed)
Patient currently abstaining, has AA folks to help; sounds motivated

## 2015-11-06 NOTE — Assessment & Plan Note (Signed)
With recent binge; numbers are not through the roof; continue to monitor

## 2015-11-06 NOTE — Patient Instructions (Addendum)
Try to use PLAIN allergy medicine without the decongestant Avoid: phenylephrine, phenylpropanolamine, and pseudoephredine Your goal blood pressure is less than 140 mmHg on top. Try to follow the DASH guidelines (DASH stands for Dietary Approaches to Stop Hypertension) Try to limit the sodium in your diet.  Ideally, consume less than 1.5 grams (less than 1,500mg ) per day. Do not add salt when cooking or at the table.  Check the sodium amount on labels when shopping, and choose items lower in sodium when given a choice. Avoid or limit foods that already contain a lot of sodium. Eat a diet rich in fruits and vegetables and whole grains. Stop the omeprazole Start the pantoprazole (similar to omeprazole, but stronger and you'll take it twice a day) Start the carafate to help protect your stomach; four times a day about an hour before breakfast, lunch, and supper, and before bed If you develop worsening abdominal pain, or bloody stools or vomit any blood, you should be seen in the ER RIGHT AWAY    Abdominal Pain, Adult Many things can cause abdominal pain. Usually, abdominal pain is not caused by a disease and will improve without treatment. It can often be observed and treated at home. Your health care provider will do a physical exam and possibly order blood tests and X-rays to help determine the seriousness of your pain. However, in many cases, more time must pass before a clear cause of the pain can be found. Before that point, your health care provider may not know if you need more testing or further treatment. HOME CARE INSTRUCTIONS Monitor your abdominal pain for any changes. The following actions may help to alleviate any discomfort you are experiencing:  Only take over-the-counter or prescription medicines as directed by your health care provider.  Do not take laxatives unless directed to do so by your health care provider.  Try a clear liquid diet (broth, tea, or water) as directed by your  health care provider. Slowly move to a bland diet as tolerated. SEEK MEDICAL CARE IF:  You have unexplained abdominal pain.  You have abdominal pain associated with nausea or diarrhea.  You have pain when you urinate or have a bowel movement.  You experience abdominal pain that wakes you in the night.  You have abdominal pain that is worsened or improved by eating food.  You have abdominal pain that is worsened with eating fatty foods.  You have a fever. SEEK IMMEDIATE MEDICAL CARE IF:  Your pain does not go away within 2 hours.  You keep throwing up (vomiting).  Your pain is felt only in portions of the abdomen, such as the right side or the left lower portion of the abdomen.  You pass bloody or black tarry stools. MAKE SURE YOU:  Understand these instructions.  Will watch your condition.  Will get help right away if you are not doing well or get worse.   This information is not intended to replace advice given to you by your health care provider. Make sure you discuss any questions you have with your health care provider.   Document Released: 07/01/2005 Document Revised: 06/12/2015 Document Reviewed: 05/31/2013 Elsevier Interactive Patient Education 2016 [employer]. Food Choices for Gastroesophageal Reflux Disease, Adult When you have gastroesophageal reflux disease (GERD), the foods you eat and your eating habits are very important. Choosing the right foods can help ease your discomfort.  WHAT GUIDELINES DO I NEED TO FOLLOW?   Choose fruits, vegetables, whole grains, and low-fat dairy products.  Choose low-fat meat, fish, and poultry.  Limit fats such as oils, salad dressings, butter, nuts, and avocado.   Keep a food diary. This helps you identify foods that cause symptoms.   Avoid foods that cause symptoms. These may be different for everyone.   Eat small meals often instead of 3 large meals a day.   Eat your meals slowly, in a place where you are  relaxed.   Limit fried foods.   Cook foods using methods other than frying.   Avoid drinking alcohol.   Avoid drinking large amounts of liquids with your meals.   Avoid bending over or lying down until 2-3 hours after eating.  WHAT FOODS ARE NOT RECOMMENDED?  These are some foods and drinks that may make your symptoms worse: Vegetables Tomatoes. Tomato juice. Tomato and spaghetti sauce. Chili peppers. Onion and garlic. Horseradish. Fruits Oranges, grapefruit, and lemon (fruit and juice). Meats High-fat meats, fish, and poultry. This includes hot dogs, ribs, ham, sausage, salami, and bacon. Dairy Whole milk and chocolate milk. Sour cream. Cream. Butter. Ice cream. Cream cheese.  Drinks Coffee and tea. Bubbly (carbonated) drinks or energy drinks. Condiments Hot sauce. Barbecue sauce.  Sweets/Desserts Chocolate and cocoa. Donuts. Peppermint and spearmint. Fats and Oils High-fat foods. This includes [place] fries and potato chips. Other Vinegar. Strong spices. This includes black pepper, white pepper, red pepper, cayenne, curry powder, cloves, ginger, and chili powder. The items listed above may not be a complete list of foods and drinks to avoid. Contact your dietitian for more information.   This information is not intended to replace advice given to you by your health care provider. Make sure you discuss any questions you have with your health care provider.   Document Released: 03/22/2012 Document Revised: 10/12/2014 Document Reviewed: 07/26/2013 Elsevier Interactive Patient Education 2016 [employer] Temporomandibular Joint Syndrome Temporomandibular joint (TMJ) syndrome is a condition that affects the joints between your jaw and your skull. The TMJs are located near your ears and allow your jaw to open and close. These joints and the nearby muscles are involved in all movements of the jaw. People with TMJ syndrome have pain in the area of these joints and muscles.  Chewing, biting, or other movements of the jaw can be difficult or painful. TMJ syndrome can be caused by various things. In many cases, the condition is mild and goes away within a few weeks. For some people, the condition can become a long-term problem. CAUSES Possible causes of TMJ syndrome include:  Grinding your teeth or clenching your jaw. Some people do this when they are under stress.  Arthritis.  Injury to the jaw.  Head or neck injury.  Teeth or dentures that are not aligned well. In some cases, the cause of TMJ syndrome may not be known. SIGNS AND SYMPTOMS The most common symptom is an aching pain on the side of the head in the area of the TMJ. Other symptoms may include:  Pain when moving your jaw, such as when chewing or biting.  Being unable to open your jaw all the way.  Making a clicking sound when you open your mouth.  Headache.  Earache.  Neck or shoulder pain. DIAGNOSIS Diagnosis can usually be made based on your symptoms, your medical history, and a physical exam. Your health care provider may check the range of motion of your jaw. Imaging tests, such as X-rays or an MRI, are sometimes done. You may need to see your dentist to determine if your  teeth and jaw are lined up correctly. TREATMENT TMJ syndrome often goes away on its own. If treatment is needed, the options may include:  Eating soft foods and applying ice or heat.  Medicines to relieve pain or inflammation.  Medicines to relax the muscles.  A splint, bite plate, or mouthpiece to prevent teeth grinding or jaw clenching.  Relaxation techniques or counseling to help reduce stress.  Transcutaneous electrical nerve stimulation (TENS). This helps to relieve pain by applying an electrical current through the skin.  Acupuncture. This is sometimes helpful to relieve pain.  Jaw surgery. This is rarely needed. HOME CARE INSTRUCTIONS  Take medicines only as directed by your health care  provider.  Eat a soft diet if you are having trouble chewing.  Apply ice to the painful area.  Put ice in a plastic bag.  Place a towel between your skin and the bag.  Leave the ice on for 20 minutes, 2-3 times a day.  Apply a warm compress to the painful area as directed.  Massage your jaw area and perform any jaw stretching exercises as recommended by your health care provider.  If you were given a mouthpiece or bite plate, wear it as directed.  Avoid foods that require a lot of chewing. Do not chew gum.  Keep all follow-up visits as directed by your health care provider. This is important. SEEK MEDICAL CARE IF:  You are having trouble eating.  You have new or worsening symptoms. SEEK IMMEDIATE MEDICAL CARE IF:  Your jaw locks open or closed.   This information is not intended to replace advice given to you by your health care provider. Make sure you discuss any questions you have with your health care provider.   Document Released: 06/16/2001 Document Revised: 10/12/2014 Document Reviewed: 04/26/2014 Elsevier Interactive Patient Education [employer]

## 2015-11-06 NOTE — Progress Notes (Signed)
BP 143/85 mmHg  Pulse 65  Temp(Src) 98.1 F (36.7 C)  Ht  (1.727 m)  Wt 166 lb (75.297 kg)  BMI 25.25 kg/m2  SpO2 99%   Subjective:    Patient ID: [name] White, male    DOB: [birth year], 45 y.o.   MRN: [id number]  HPI: [name] White is a 45 y.o. male  Chief Complaint  Patient presents with  . 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  HTN; checks BP away from the doctor, at [facility] sometimes; 157/91; high then; he did not have one of his pills today; no side effects Tries to stay away from salt Tries to do DASH guidelines Takes medicine every morning at 5 am  He has been sick the last few days; vomiting and diarrhea x 2 days Hx of alcohol use and then went on a binge; last alcohol 5-6 days ago; bad bender for 2-3 weeks; no nausea now; epigastric pain, wants to eat but burns in the epigastrium; blood in the stool just yesterday; raw from wiping; no blood in the vomit; blood in the stool was really just when wiping, just from being raw; stool is not maroon; poop is brown he says; pain  Is a 2 out of 10 right now, up to a 4 out of 10 with abdominal exam  Relevant past medical, surgical, family and social history reviewed and updated as indicated. Interim medical history since our last visit reviewed. Allergies and medications reviewed and updated.  Review of Systems Bad ear pains left side, 2 days ago really bad; alternating for about a month; no fevers; wife has been putting ear drops in it Per HPI unless specifically indicated above     Objective:    BP 143/85 mmHg  Pulse 65  Temp(Src) 98.1 F (36.7 C)  Ht  (1.727 m)  Wt 166 lb (75.297 kg)  BMI 25.25 kg/m2  SpO2 99%  Wt Readings from Last 3 Encounters:  11/06/15 166 lb (75.297 kg)  05/17/15 175 lb 12.8 oz (79.742 kg)  12/17/14 169 lb (76.658 kg)    Physical Exam  Constitutional: He appears well-developed and well-nourished. No distress.  HENT:  Head: Normocephalic and atraumatic.   Right Ear: Hearing, external ear and ear canal normal.  Left Ear: Hearing, tympanic membrane, external ear and ear canal normal. No drainage, swelling or tenderness. No mastoid tenderness. Tympanic membrane is not injected, not scarred, not erythematous, not retracted and not bulging.  No middle ear effusion.  Nose: No rhinorrhea.  Mouth/Throat: Oropharynx is clear and moist and mucous membranes are normal. No oral lesions. Normal dentition. No dental abscesses or dental caries. No oropharyngeal exudate.  Eyes: EOM are normal. No scleral icterus.  Neck: No JVD present.  Cardiovascular: Normal rate and regular rhythm.   Pulmonary/Chest: Effort normal and breath sounds normal.  Abdominal: Soft. Bowel sounds are normal. He exhibits no distension. There is tenderness in the epigastric area. There is no guarding.  Genitourinary:  Pt declined rectal exam  Lymphadenopathy:       Head (right side): No submandibular, no preauricular, no posterior auricular and no occipital adenopathy present.       Head (left side): No submandibular, no preauricular, no posterior auricular and no occipital adenopathy present.    He has no cervical adenopathy.  Skin: No pallor.  Psychiatric: He has a normal mood and affect. His speech is normal. His mood appears not anxious. He does not  exhibit a depressed mood.    Results for orders placed or performed in visit on 11/06/15  CBC With Differential/Platelet  Result Value Ref Range   WBC 7.4 3.4 - 10.8 x10E3/uL   RBC 4.84 4.14 - 5.80 x10E6/uL   Hemoglobin 16.0 12.6 - 17.7 g/dL   Hematocrit 16.1 09.6 - 51.0 %   MCV 91 79 - 97 fL   MCH 33.1 (H) 26.6 - 33.0 pg   MCHC 36.4 (H) 31.5 - 35.7 g/dL   RDW 04.5 40.9 - 81.1 %   Platelets 169 150 - 379 x10E3/uL   Neutrophils 84 %   Lymphs 14 %   MID 2 %   Neutrophils Absolute 6.2 1.4 - 7.0 x10E3/uL   Lymphocytes Absolute 1.0 0.7 - 3.1 x10E3/uL   MID (Absolute) 0.2 0.1 - 1.6 X10E3/uL  ALT (SGPT) Piccolo, Waived  Result  Value Ref Range   ALT (SGPT) Piccolo, Waived 45 10 - 47 U/L  AST (SGOT) Piccolo, Waived  Result Value Ref Range   AST (SGOT) Piccolo, Waived 70 (H) 11 - 38 U/L      Assessment & Plan:   Problem List Items Addressed This Visit      Cardiovascular and Mediastinum   Hypertension - Primary    Fair control, but patient has been out of BP medicine today ([facility]); stress reduction, weight management, DASH guidelines, avoidance of decongestants; cannot take ACE-I; patient and I discussed options; he does not want a third medicine, will followup closely next week      Relevant Medications   metoprolol succinate (TOPROL-XL) 100 MG 24 hr tablet   amLODipine (NORVASC) 10 MG tablet   Other Relevant Orders   Nocturnal polysomnography (NPSG)     Digestive   Fatty liver    Check labs today      Relevant Orders   Lipid Panel w/o Chol/HDL Ratio     Endocrine   IFG (impaired fasting glucose)    Check A1c today      Relevant Orders   Hgb A1c w/o eAG     Other   Anxiety    Suggested counseling; he will attend AA; continue SNRI; stress reduction encouraged      Relevant Medications   venlafaxine XR (EFFEXOR-XR) 37.5 MG 24 hr capsule   Alcohol abuse, episodic    Patient currently abstaining, has AA folks to help; sounds motivated      Elevated liver function tests    With recent binge; numbers are not through the roof; continue to monitor      Acute epigastric pain    Check labs, and upper GI; detailed discussion about differential, reasons to seek immediate medical care, including pancreatitis, GI bleed, ulcer, perforated ulcer, gastritis, etc; he will start carafate and BID PPI; avoidance of acid triggers; again, reasons to seek immeiate medical care reviewed in detail      Relevant Orders   CBC With Differential/Platelet (Completed)   ALT (SGPT) Piccolo, Waived (Completed)   AST (SGOT) Piccolo, Waived (Completed)   Lipase   Comprehensive metabolic panel   DG UGI  W/KUB    Snoring    Home sleep study referral entered; if untreated OSA, might be able to control BP better with fewer pills      Relevant Orders   Nocturnal polysomnography (NPSG)      Follow up plan: Return in about 1 week (around 11/13/2015) for close f/u.  An after-visit summary was printed and given to the patient at check-out.  Please  see the patient instructions which may contain other information and recommendations beyond what is mentioned above in the assessment and plan. Orders Placed This Encounter  Procedures  . DG UGI  W/KUB  . CBC With Differential/Platelet  . ALT (SGPT) Piccolo, [place]  . AST (SGOT) Piccolo, [place]  . Lipase  . Comprehensive metabolic panel  . Lipid Panel w/o Chol/HDL Ratio  . Hgb A1c w/o eAG  . Nocturnal polysomnography (NPSG)

## 2015-11-06 NOTE — Assessment & Plan Note (Addendum)
Fair control, but patient has been out of BP medicine today ([facility]); stress reduction, weight management, DASH guidelines, avoidance of decongestants; cannot take ACE-I; patient and I discussed options; he does not want a third medicine, will followup closely next week

## 2015-11-06 NOTE — Assessment & Plan Note (Addendum)
Check labs, and upper GI; detailed discussion about differential, reasons to seek immediate medical care, including pancreatitis, GI bleed, ulcer, perforated ulcer, gastritis, etc; he will start carafate and BID PPI; avoidance of acid triggers; again, reasons to seek immeiate medical care reviewed in detail

## 2015-11-06 NOTE — Assessment & Plan Note (Signed)
Check labs today.

## 2015-11-06 NOTE — Assessment & Plan Note (Addendum)
Suggested counseling; he will attend AA; continue SNRI; stress reduction encouraged

## 2015-11-06 NOTE — Telephone Encounter (Signed)
Patient states that he needs to have a DOT physical.  Patient says that Dr. Sherie Don completed his last DOT.  I explained that Dr. Sherie Don no longer performs DOTs and that he would need to make an appt with Gabriel Cirri.  Patient says that he will reschedule appt for his DOT but wants to see Dr. Sherie Don for a CPE today.

## 2015-11-06 NOTE — Assessment & Plan Note (Signed)
Check A1c today.

## 2015-11-06 NOTE — Assessment & Plan Note (Signed)
Home sleep study referral entered; if untreated OSA, might be able to control BP better with fewer pills

## 2015-11-07 ENCOUNTER — Telehealth: Payer: Self-pay | Admitting: Family Medicine

## 2015-11-07 DIAGNOSIS — R1013 Epigastric pain: Secondary | ICD-10-CM

## 2015-11-07 LAB — COMPREHENSIVE METABOLIC PANEL
ALT: 39 IU/L (ref 0–44)
AST: 60 IU/L — ABNORMAL HIGH (ref 0–40)
Albumin/Globulin Ratio: 1.8 (ref 1.1–2.5)
Albumin: 4.4 g/dL (ref 3.5–5.5)
Alkaline Phosphatase: 177 IU/L — ABNORMAL HIGH (ref 39–117)
BUN/Creatinine Ratio: 11 (ref 9–20)
BUN: 10 mg/dL (ref 6–24)
Bilirubin Total: 1.1 mg/dL (ref 0.0–1.2)
CO2: 26 mmol/L (ref 18–29)
Calcium: 9.9 mg/dL (ref 8.7–10.2)
Chloride: 95 mmol/L — ABNORMAL LOW (ref 96–106)
Creatinine, Ser: 0.9 mg/dL (ref 0.76–1.27)
GFR calc Af Amer: 118 mL/min/{1.73_m2} (ref >59)
GFR calc non Af Amer: 102 mL/min/{1.73_m2} (ref >59)
Globulin, Total: 2.4 g/dL (ref 1.5–4.5)
Glucose: 135 mg/dL — ABNORMAL HIGH (ref 65–99)
Potassium: 3.4 mmol/L — ABNORMAL LOW (ref 3.5–5.2)
Sodium: 139 mmol/L (ref 134–144)
Total Protein: 6.8 g/dL (ref 6.0–8.5)

## 2015-11-07 LAB — LIPID PANEL W/O CHOL/HDL RATIO
Cholesterol, Total: 224 mg/dL — ABNORMAL HIGH (ref 100–199)
HDL: 74 mg/dL (ref >39)
LDL Calculated: 108 mg/dL — ABNORMAL HIGH (ref 0–99)
Triglycerides: 210 mg/dL — ABNORMAL HIGH (ref 0–149)
VLDL Cholesterol Cal: 42 mg/dL — ABNORMAL HIGH (ref 5–40)

## 2015-11-07 LAB — LIPASE: Lipase: 405 U/L — ABNORMAL HIGH (ref 0–59)

## 2015-11-07 NOTE — Assessment & Plan Note (Signed)
Labs tomorrow

## 2015-11-07 NOTE — Telephone Encounter (Signed)
I called the patient about his lab results; lipase elevated; he is feeling much better; I recommended that we get a stat CT scan to look for pancreatitis, other problem; he declined; I explained my official recommendation is for him to get the scan, but he says he just can't b/c of work; pay attention to his symptoms, if worse, go to  ER; recheck labs tomorrow

## 2015-11-12 ENCOUNTER — Encounter: Payer: Self-pay | Admitting: Unknown Physician Specialty

## 2015-11-14 ENCOUNTER — Encounter: Payer: Self-pay | Admitting: Family Medicine

## 2015-11-14 ENCOUNTER — Ambulatory Visit: Payer: 59 | Admitting: Family Medicine

## 2015-12-03 ENCOUNTER — Other Ambulatory Visit: Payer: Self-pay | Admitting: Family Medicine

## 2015-12-03 NOTE — Telephone Encounter (Signed)
Was due for follow up after 1 week. Not scheduled. Can we please get him scheduled ASAP.

## 2015-12-04 NOTE — Telephone Encounter (Signed)
Patient had a 1 week follow up scheduled but he no showed. Number disconnected. Dr. Sherie Don do you want to refill this?

## 2015-12-05 NOTE — Telephone Encounter (Signed)
I'll revert back to once a day; will send refill

## 2015-12-23 ENCOUNTER — Other Ambulatory Visit: Payer: Self-pay | Admitting: Family Medicine

## 2015-12-23 ENCOUNTER — Encounter: Payer: Self-pay | Admitting: Emergency Medicine

## 2015-12-23 ENCOUNTER — Emergency Department: Payer: Managed Care, Other (non HMO)

## 2015-12-23 ENCOUNTER — Emergency Department
Admission: EM | Admit: 2015-12-23 | Discharge: 2015-12-23 | Disposition: A | Discharge status: Home / Self Care | Payer: Managed Care, Other (non HMO) | Attending: Emergency Medicine | Admitting: Emergency Medicine

## 2015-12-23 DIAGNOSIS — R079 Chest pain, unspecified: Secondary | ICD-10-CM | POA: Insufficient documentation

## 2015-12-23 DIAGNOSIS — M1990 Unspecified osteoarthritis, unspecified site: Secondary | ICD-10-CM | POA: Diagnosis not present

## 2015-12-23 DIAGNOSIS — F329 Major depressive disorder, single episode, unspecified: Secondary | ICD-10-CM | POA: Diagnosis not present

## 2015-12-23 DIAGNOSIS — E663 Overweight: Secondary | ICD-10-CM | POA: Insufficient documentation

## 2015-12-23 DIAGNOSIS — F419 Anxiety disorder, unspecified: Secondary | ICD-10-CM

## 2015-12-23 DIAGNOSIS — I10 Essential (primary) hypertension: Secondary | ICD-10-CM | POA: Diagnosis not present

## 2015-12-23 DIAGNOSIS — Z79899 Other long term (current) drug therapy: Secondary | ICD-10-CM | POA: Diagnosis not present

## 2015-12-23 DIAGNOSIS — F17210 Nicotine dependence, cigarettes, uncomplicated: Secondary | ICD-10-CM | POA: Diagnosis not present

## 2015-12-23 DIAGNOSIS — E876 Hypokalemia: Secondary | ICD-10-CM

## 2015-12-23 DIAGNOSIS — R0789 Other chest pain: Secondary | ICD-10-CM | POA: Diagnosis present

## 2015-12-23 LAB — COMPREHENSIVE METABOLIC PANEL
ALT: 51 U/L (ref 17–63)
AST: 52 U/L — ABNORMAL HIGH (ref 15–41)
Albumin: 3.9 g/dL (ref 3.5–5.0)
Alkaline Phosphatase: 120 U/L (ref 38–126)
Anion gap: 11 (ref 5–15)
BUN: 11 mg/dL (ref 6–20)
CO2: 27 mmol/L (ref 22–32)
Calcium: 8.5 mg/dL — ABNORMAL LOW (ref 8.9–10.3)
Chloride: 98 mmol/L — ABNORMAL LOW (ref 101–111)
Creatinine, Ser: 0.63 mg/dL (ref 0.61–1.24)
GFR calc Af Amer: >60 mL/min (ref >60)
GFR calc non Af Amer: >60 mL/min (ref >60)
Glucose, Bld: 148 mg/dL — ABNORMAL HIGH (ref 65–99)
Potassium: 2.6 mmol/L — CL (ref 3.5–5.1)
Sodium: 136 mmol/L (ref 135–145)
Total Bilirubin: 0.7 mg/dL (ref 0.3–1.2)
Total Protein: 7.1 g/dL (ref 6.5–8.1)

## 2015-12-23 LAB — CBC WITH DIFFERENTIAL/PLATELET
Basophils Absolute: 0 10*3/uL (ref 0–0.1)
Basophils Relative: 1 %
Eosinophils Absolute: 0 10*3/uL (ref 0–0.7)
Eosinophils Relative: 1 %
HCT: 46 % (ref 40.0–52.0)
Hemoglobin: 16.1 g/dL (ref 13.0–18.0)
Lymphocytes Relative: 22 %
Lymphs Abs: 1.4 10*3/uL (ref 1.0–3.6)
MCH: 32.9 pg (ref 26.0–34.0)
MCHC: 35 g/dL (ref 32.0–36.0)
MCV: 93.9 fL (ref 80.0–100.0)
Monocytes Absolute: 0.5 10*3/uL (ref 0.2–1.0)
Monocytes Relative: 7 %
Neutro Abs: 4.4 10*3/uL (ref 1.4–6.5)
Neutrophils Relative %: 69 %
Platelets: 225 10*3/uL (ref 150–440)
RBC: 4.89 MIL/uL (ref 4.40–5.90)
RDW: 14.2 % (ref 11.5–14.5)
WBC: 6.4 10*3/uL (ref 3.8–10.6)

## 2015-12-23 LAB — LIPASE, BLOOD: Lipase: 44 U/L (ref 11–51)

## 2015-12-23 LAB — TROPONIN I: Troponin I: <0.03 ng/mL (ref <0.031)

## 2015-12-23 MED ORDER — ONDANSETRON HCL 4 MG/2ML IJ SOLN
INTRAMUSCULAR | Status: AC
  Administered 2015-12-23: 4 mg via INTRAVENOUS
  Filled 2015-12-23: qty 2

## 2015-12-23 MED ORDER — POTASSIUM CHLORIDE CRYS ER 20 MEQ PO TBCR
EXTENDED_RELEASE_TABLET | ORAL | Status: AC
  Administered 2015-12-23: 40 meq via ORAL
  Filled 2015-12-23: qty 2

## 2015-12-23 MED ORDER — ONDANSETRON HCL 4 MG/2ML IJ SOLN
4.0000 mg | Freq: Once | INTRAMUSCULAR | Status: AC
  Administered 2015-12-23: 4 mg via INTRAVENOUS

## 2015-12-23 MED ORDER — POTASSIUM CHLORIDE CRYS ER 20 MEQ PO TBCR
40.0000 meq | EXTENDED_RELEASE_TABLET | Freq: Once | ORAL | Status: AC
  Administered 2015-12-23: 40 meq via ORAL

## 2015-12-23 MED ORDER — ASPIRIN 81 MG PO CHEW
324.0000 mg | CHEWABLE_TABLET | Freq: Once | ORAL | Status: AC
  Administered 2015-12-23: 324 mg via ORAL
  Filled 2015-12-23: qty 4

## 2015-12-23 MED ORDER — POTASSIUM CHLORIDE 10 MEQ/100ML IV SOLN
10.0000 meq | Freq: Once | INTRAVENOUS | Status: AC
  Administered 2015-12-23: 10 meq via INTRAVENOUS
  Filled 2015-12-23: qty 100

## 2015-12-23 NOTE — ED Notes (Signed)
C/o chest pain onset last pm

## 2015-12-23 NOTE — ED Notes (Signed)
Critical Potassium result given to Mel Almond Lord, MD at this time.  Verbal orders given.

## 2015-12-23 NOTE — ED Provider Notes (Signed)
[facility] Emergency Department Provider Note   ____________________________________________  Time seen: I have reviewed the triage vital signs and the triage nursing note.  HISTORY  Chief Complaint Chest Pain   Historian Patient  HPI [name] White is a 45 y.o. [name] with a smoking history, history of GERD, history of anxiety and depression, who is here with complaint of intermittent central chest pain and pressure for several weeks. He states oftentimes awaken him up in the middle of the night and his heart will be raising off central chest pressure. No real shortness of breath, or coughing, or fevers. He states he might of had a stress test many years ago.  He does take medication for blood pressure. 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  He is also under a lot of stress with 5 kids.  Today's chest pain has been ongoing at least for 24 hours    Past Medical History  Diagnosis Date  . Arthritis   . Avascular necrosis (HCC)   . IFG (impaired fasting glucose)   . Hyperlipidemia   . Hypertension   . Depression   . Anxiety   . GERD (gastroesophageal reflux disease)   . Overweight   . History of alcohol abuse   . Alcohol abuse, in remission     Patient Active Problem List   Diagnosis Date Noted  . Acute epigastric pain 11/06/2015  . Snoring 11/06/2015  . Elevated liver function tests 05/19/2015  . Fatty liver 05/19/2015  . Elevated serum GGT level 05/17/2015  . IFG (impaired fasting glucose)   . Hyperlipidemia   . Hypertension   . Depression   . Anxiety   . GERD (gastroesophageal reflux disease)   . Overweight   . Alcohol abuse, episodic   . 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 Carpal tunnel release Bilateral     right x 2, left x 1  . Testicle torsion  reduction    . Total hip arthroplasty Left 01/30/2014    Procedure: LEFT TOTAL HIP ARTHROPLASTY ANTERIOR APPROACH;  Surgeon: Shelda Pal, MD;  Location: [facility] ORS;  Service: Orthopedics;  Laterality: Left;  Marland Kitchen Vasectomy  1995  . Vasectomy reversal  1997    Current Outpatient Rx  Name  Route  Sig  Dispense  Refill  . amLODipine (NORVASC) 10 MG tablet   Oral   Take 1 tablet (10 mg total) by mouth every morning.   30 tablet   6   . metoprolol succinate (TOPROL-XL) 100 MG 24 hr tablet   Oral   Take 1 tablet (100 mg total) by mouth daily. Take with or immediately following a meal.   30 tablet   6   . omeprazole (PRILOSEC) 20 MG capsule   Oral   Take 20 mg by mouth daily as needed.         . pantoprazole (PROTONIX) 40 MG tablet   Oral   Take 1 tablet (40 mg total) by mouth daily.   30 tablet   1   . venlafaxine XR (EFFEXOR-XR) 37.5 MG 24 hr capsule   Oral   Take 1 capsule (37.5 mg total) by mouth daily with breakfast.   30 capsule   6     Allergies Ace inhibitors; Captopril; Enalapril; Fosinopril; Lisinopril; and Ramipril  Family History  Problem Relation Age of Onset  . Hypertension Mother   . Diabetes Mother   . Hypertension Father   . Stroke Father   . Cancer Neg Hx   . COPD Neg Hx   . Heart disease Neg Hx     Social History Social History  Substance Use Topics  . Smoking status: Current Some Day Smoker -- 1.00 packs/day for 30 years    Types: Cigarettes  . Smokeless tobacco: Never Used  . Alcohol Use: No    Review of Systems  Constitutional: Negative for fever. Eyes: Negative for visual changes. ENT: Negative for sore throat. Cardiovascular: Positive for chest pain. Respiratory: Negative for shortness of breath. Gastrointestinal: Negative for abdominal pain, vomiting and diarrhea. Genitourinary: Negative for dysuria. Musculoskeletal: Negative for back pain. Skin: Negative for rash. Neurological: Negative for headache. 10 point Review of Systems  otherwise negative ____________________________________________   PHYSICAL EXAM:  VITAL SIGNS: ED Triage Vitals  Enc Vitals Group     BP 12/23/15 0755 150/113 mmHg     Pulse Rate 12/23/15 0755 81     Resp 12/23/15 0755 18     Temp 12/23/15 0755 98.1 F (36.7 C)     Temp Source 12/23/15 0755 Oral     SpO2 12/23/15 0755 97 %     Weight --      Height --      Head Cir --      Peak Flow --      Pain Score 12/23/15 0747 6     Pain Loc --      Pain Edu? --      Excl. in GC? --      Constitutional: Alert and oriented. Well appearing and in no distress.Somewhat anxious HEENT   Head: Normocephalic and atraumatic.      Eyes: Conjunctivae are normal. PERRL. Normal extraocular movements.      Ears:         Nose: No congestion/rhinnorhea.   Mouth/Throat: Mucous membranes are moist.   Neck: No stridor. Cardiovascular/Chest: Normal rate, regular rhythm.  No murmurs, rubs, or gallops. Respiratory: Normal respiratory effort without tachypnea nor retractions. Breath sounds are clear and equal bilaterally. No wheezes/rales/rhonchi. Gastrointestinal: Soft. No distention, no guarding, no rebound. Nontender.    Genitourinary/rectal:Deferred Musculoskeletal: Nontender with normal range of motion in all extremities. No joint effusions.  No lower extremity tenderness.  No edema. Neurologic:  Normal speech and language. No gross or focal neurologic deficits are appreciated. Skin:  Skin is warm, dry and intact. No rash noted. Psychiatric: Mood and affect are normal. Speech and behavior are normal. Patient exhibits appropriate insight and judgment.  ____________________________________________   EKG I, Governor Rooks, MD, the attending physician have personally viewed and interpreted all ECGs.  70 bpm. Normal sinus rhythm. Narrow QRS. Normal axis. Nonspecific ST and T-wave ____________________________________________  LABS (pertinent positives/negatives)  Troponin less than  0.03 White blood count 6.4, hemoglobin 16.1 and platelet count 20 findings and comprehensive metabolic panel significant for potassium 2.6 and otherwise without significant abnormality Lipase 44  ____________________________________________  RADIOLOGY All Xrays were viewed by me. Imaging interpreted by Radiologist.  Chest two-view: No active cardiopulmonary disease. __________________________________________  PROCEDURES  Procedure(s) performed: None  Critical Care performed: None  ____________________________________________   ED COURSE / ASSESSMENT AND PLAN  Pertinent labs & imaging results that were available during my care of the patient were reviewed by me and considered in my medical decision making (see chart for details).   No known coronary  disease, multiple risk factors for coronary disease.  EKG is reassuring. Symptoms sound like it may be significantly due to the stress. His initial troponin is negative. Serum having chest pain essentially all night and I don't think a repeat troponin is clinically indicated given the duration of symptoms.  He was given by mouth and IV repletion of potassium. I'm unclear as to the source of why he is been getting hypokalemic.  I discussed with the cardiologist, who will see him in his office this afternoon.    CONSULTATIONS:  Dr.Khan, to see patient in office this afternoon.  Patient / Family / Caregiver informed of clinical course, medical decision-making process, and agree with plan.   I discussed return precautions, follow-up instructions, and discharged instructions with patient and/or family.   ___________________________________________   FINAL CLINICAL IMPRESSION(S) / ED DIAGNOSES   Final diagnoses:  Hypokalemia  Chest pain, unspecified  Anxiety              Note: This dictation was prepared with Dragon dictation. Any transcriptional errors that result from this process are unintentional   Governor Rooksebecca  Dandrae Kustra, MD 12/23/15 1228

## 2015-12-23 NOTE — ED Notes (Signed)
Pt states he will go directly to Dr. Santo HeldKahn's office at time of discharge, as planned with Dr. Shaune PollackLord.

## 2015-12-23 NOTE — ED Notes (Signed)
Pt states he has been having chest pain for approximately 2.5 weeks, but last night he experienced  Worsening chest pain.  Pt states he was dizzy and light-headed when he woke up this morning and that he woke up several times overnight "gasping for air".

## 2015-12-23 NOTE — Telephone Encounter (Signed)
I prescribed Protonix to him earlier this month; he should not be out of that He should NOT take Protonix and omeprazole (Prilosec) together I'm denying omeprazole (Prilosec)

## 2015-12-23 NOTE — Discharge Instructions (Signed)
You were evaluated for central chest discomfort, and although no certain cause was found, your exam and evaluation are reassuring in the emergency department stay. We discussed, you do need to follow-up with a cardiologist, and are referred to call Dr. Welton Flakes.   We discussed anxiety may be playing a significant role in your symptoms, and I suggest looking up "tapping" meditation on google or YouTube, or [email or web address].com [employer] 2017).  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  You are also referred to [facility] for mental health evaluation for possible counseling or psychiatrist appointment.  Return to the emergency room for any worsening condition could've worsening chest pain, nausea, sweats, dizziness or passing out, weakness or numbness, confusion or altered mental status or any other symptoms concerning to you.     Hypokalemia Hypokalemia means that the amount of potassium in the blood is lower than normal.Potassium is a chemical, called an electrolyte, that helps regulate the amount of fluid in the body. It also stimulates muscle contraction and helps nerves function properly.Most of the body's potassium is inside of cells, and only a very small amount is in the blood. Because the amount in the blood is so small, minor changes can be life-threatening. CAUSES  Antibiotics.  Diarrhea or vomiting.  Using laxatives too much, which can cause diarrhea.  Chronic kidney disease.  Water pills (diuretics).  Eating disorders (bulimia).  Low magnesium level.  Sweating a lot. SIGNS AND SYMPTOMS  Weakness.  Constipation.  Fatigue.  Muscle cramps.  Mental confusion.  Skipped heartbeats or irregular heartbeat (palpitations).  Tingling or numbness. DIAGNOSIS  Your health care provider can diagnose hypokalemia with blood tests. In addition to checking  your potassium level, your health care provider may also check other lab tests. TREATMENT Hypokalemia can be treated with potassium supplements taken by mouth or adjustments in your current medicines. If your potassium level is very low, you may need to get potassium through a vein (IV) and be monitored in the hospital. A diet high in potassium is also helpful. Foods high in potassium are:  Nuts, such as peanuts and pistachios.  Seeds, such as sunflower seeds and pumpkin seeds.  Peas, lentils, and lima beans.  Whole grain and bran cereals and breads.  Fresh fruit and vegetables, such as apricots, avocado, bananas, cantaloupe, kiwi, oranges, tomatoes, asparagus, and potatoes.  Orange and tomato juices.  Red meats.  Fruit yogurt. HOME CARE INSTRUCTIONS  Take all medicines as prescribed by your health care provider.  Maintain a healthy diet by including nutritious food, such as fruits, vegetables, nuts, whole grains, and lean meats.  If you are taking a laxative, be sure to follow the directions on the label. SEEK MEDICAL CARE IF:  Your weakness gets worse.  You feel your heart pounding or racing.  You are vomiting or having diarrhea.  You are diabetic and having trouble keeping your blood glucose in the normal range. SEEK IMMEDIATE MEDICAL CARE IF:  You have chest pain, shortness of breath, or dizziness.  You are vomiting or having diarrhea for more than 2 days.  You faint. MAKE SURE YOU:   Understand these instructions.  Will watch your condition.  Will get help right away if you are not doing well or get worse.   This information is not intended to replace advice given to you by your health care provider. Make sure you discuss any questions  you have with your health care provider.   Document Released: 09/21/2005 Document Revised: 10/12/2014 Document Reviewed: 03/24/2013 Elsevier Interactive Patient Education 2016 Elsevier Inc.  Nonspecific Chest Pain  Chest  pain can be caused by many different conditions. There is always a chance that your pain could be related to something serious, such as a heart attack or a blood clot in your lungs. Chest pain can also be caused by conditions that are not life-threatening. If you have chest pain, it is very important to follow up with your health care provider. CAUSES  Chest pain can be caused by:  Heartburn.  Pneumonia or bronchitis.  Anxiety or stress.  Inflammation around your heart (pericarditis) or lung (pleuritis or pleurisy).  A blood clot in your lung.  A collapsed lung (pneumothorax). It can develop suddenly on its own (spontaneous pneumothorax) or from trauma to the chest.  Shingles infection (varicella-zoster virus).  Heart attack.  Damage to the bones, muscles, and cartilage that make up your chest wall. This can include:  Bruised bones due to injury.  Strained muscles or cartilage due to frequent or repeated coughing or overwork.  Fracture to one or more ribs.  Sore cartilage due to inflammation (costochondritis). RISK FACTORS  Risk factors for chest pain may include:  Activities that increase your risk for trauma or injury to your chest.  Respiratory infections or conditions that cause frequent coughing.  Medical conditions or overeating that can cause heartburn.  Heart disease or family history of heart disease.  Conditions or health behaviors that increase your risk of developing a blood clot.  Having had chicken pox (varicella zoster). SIGNS AND SYMPTOMS Chest pain can feel like:  Burning or tingling on the surface of your chest or deep in your chest.  Crushing, pressure, aching, or squeezing pain.  Dull or sharp pain that is worse when you move, cough, or take a deep breath.  Pain that is also felt in your back, neck, shoulder, or arm, or pain that spreads to any of these areas. Your chest pain may come and go, or it may stay constant. DIAGNOSIS Lab tests or  other studies may be needed to find the cause of your pain. Your health care provider may have you take a test called an ambulatory ECG (electrocardiogram). An ECG records your heartbeat patterns at the time the test is performed. You may also have other tests, such as:  Transthoracic echocardiogram (TTE). During echocardiography, sound waves are used to create a picture of all of the heart structures and to look at how blood flows through your heart.  Transesophageal echocardiogram (TEE).This is a more advanced imaging test that obtains images from inside your body. It allows your health care provider to see your heart in finer detail.  Cardiac monitoring. This allows your health care provider to monitor your heart rate and rhythm in real time.  Holter monitor. This is a portable device that records your heartbeat and can help to diagnose abnormal heartbeats. It allows your health care provider to track your heart activity for several days, if needed.  Stress tests. These can be done through exercise or by taking medicine that makes your heart beat more quickly.  Blood tests.  Imaging tests. TREATMENT  Your treatment depends on what is causing your chest pain. Treatment may include:  Medicines. These may include:  Acid blockers for heartburn.  Anti-inflammatory medicine.  Pain medicine for inflammatory conditions.  Antibiotic medicine, if an infection is present.  Medicines to dissolve  blood clots.  Medicines to treat coronary artery disease.  Supportive care for conditions that do not require medicines. This may include:  Resting.  Applying heat or cold packs to injured areas.  Limiting activities until pain decreases. HOME CARE INSTRUCTIONS  If you were prescribed an antibiotic medicine, finish it all even if you start to feel better.  Avoid any activities that bring on chest pain.  Do not use any tobacco products, including cigarettes, chewing tobacco, or electronic  cigarettes. If you need help quitting, ask your health care provider.  Do not drink alcohol.  Take medicines only as directed by your health care provider.  Keep all follow-up visits as directed by your health care provider. This is important. This includes any further testing if your chest pain does not go away.  If heartburn is the cause for your chest pain, you may be told to keep your head raised (elevated) while sleeping. This reduces the chance that acid will go from your stomach into your esophagus.  Make lifestyle changes as directed by your health care provider. These may include:  Getting regular exercise. Ask your health care provider to suggest some activities that are safe for you.  Eating a heart-healthy diet. A registered dietitian can help you to learn healthy eating options.  Maintaining a healthy weight.  Managing diabetes, if necessary.  Reducing stress. SEEK MEDICAL CARE IF:  Your chest pain does not go away after treatment.  You have a rash with blisters on your chest.  You have a fever. SEEK IMMEDIATE MEDICAL CARE IF:   Your chest pain is worse.  You have an increasing cough, or you cough up blood.  You have severe abdominal pain.  You have severe weakness.  You faint.  You have chills.  You have sudden, unexplained chest discomfort.  You have sudden, unexplained discomfort in your arms, back, neck, or jaw.  You have shortness of breath at any time.  You suddenly start to sweat, or your skin gets clammy.  You feel nauseous or you vomit.  You suddenly feel light-headed or dizzy.  Your heart begins to beat quickly, or it feels like it is skipping beats. These symptoms may represent a serious problem that is an emergency. Do not wait to see if the symptoms will go away. Get medical help right away. Call your local emergency services (911 in the [place] Do not drive yourself to the hospital.   This information is not intended to replace advice  given to you by your health care provider. Make sure you discuss any questions you have with your health care provider.   Document Released: 07/01/2005 Document Revised: 10/12/2014 Document Reviewed: 04/27/2014 Elsevier Interactive Patient Education [employer]

## 2015-12-24 ENCOUNTER — Other Ambulatory Visit: Payer: Self-pay | Admitting: Family Medicine

## 2015-12-24 NOTE — Telephone Encounter (Signed)
Pharmacy notified, cancelled omeprazole rx.

## 2015-12-24 NOTE — Telephone Encounter (Signed)
I received a 2nd request for omeprazole from pharmacy; I sent 2nd message back to them that he was prescribed pantoprazole and he should NOT be on two PPIs

## 2015-12-31 ENCOUNTER — Ambulatory Visit (INDEPENDENT_AMBULATORY_CARE_PROVIDER_SITE_OTHER): Payer: Managed Care, Other (non HMO) | Admitting: Internal Medicine

## 2015-12-31 ENCOUNTER — Encounter: Payer: Self-pay | Admitting: Internal Medicine

## 2015-12-31 ENCOUNTER — Ambulatory Visit
Admission: RE | Admit: 2015-12-31 | Discharge: 2015-12-31 | Disposition: A | Discharge status: Home / Self Care | Payer: Managed Care, Other (non HMO) | Source: Ambulatory Visit | Attending: Internal Medicine | Admitting: Internal Medicine

## 2015-12-31 ENCOUNTER — Other Ambulatory Visit: Payer: Self-pay | Admitting: *Deleted

## 2015-12-31 ENCOUNTER — Encounter: Payer: Self-pay | Admitting: Cardiology

## 2015-12-31 VITALS — BP 126/90 | HR 58 | Ht 69.0 in | Wt 177.4 lb

## 2015-12-31 DIAGNOSIS — R072 Precordial pain: Secondary | ICD-10-CM

## 2015-12-31 DIAGNOSIS — I10 Essential (primary) hypertension: Secondary | ICD-10-CM

## 2015-12-31 DIAGNOSIS — R9439 Abnormal result of other cardiovascular function study: Secondary | ICD-10-CM | POA: Insufficient documentation

## 2015-12-31 DIAGNOSIS — Z01812 Encounter for preprocedural laboratory examination: Secondary | ICD-10-CM | POA: Diagnosis not present

## 2015-12-31 DIAGNOSIS — E785 Hyperlipidemia, unspecified: Secondary | ICD-10-CM

## 2015-12-31 DIAGNOSIS — R931 Abnormal findings on diagnostic imaging of heart and coronary circulation: Secondary | ICD-10-CM | POA: Diagnosis not present

## 2015-12-31 DIAGNOSIS — R079 Chest pain, unspecified: Secondary | ICD-10-CM

## 2015-12-31 DIAGNOSIS — D689 Coagulation defect, unspecified: Secondary | ICD-10-CM

## 2015-12-31 DIAGNOSIS — Z01818 Encounter for other preprocedural examination: Secondary | ICD-10-CM

## 2015-12-31 DIAGNOSIS — R5383 Other fatigue: Secondary | ICD-10-CM

## 2015-12-31 LAB — BASIC METABOLIC PANEL
BUN: 15 mg/dL (ref 7–25)
CO2: 26 mmol/L (ref 20–31)
Calcium: 9.4 mg/dL (ref 8.6–10.3)
Chloride: 97 mmol/L — ABNORMAL LOW (ref 98–110)
Creat: 0.79 mg/dL (ref 0.60–1.35)
Glucose, Bld: 160 mg/dL — ABNORMAL HIGH (ref 65–99)
Potassium: 3.1 mmol/L — ABNORMAL LOW (ref 3.5–5.3)
Sodium: 136 mmol/L (ref 135–146)

## 2015-12-31 LAB — CBC
HCT: 45.5 % (ref 39.0–52.0)
Hemoglobin: 16.3 g/dL (ref 13.0–17.0)
MCH: 33.1 pg (ref 26.0–34.0)
MCHC: 35.8 g/dL (ref 30.0–36.0)
MCV: 92.5 fL (ref 78.0–100.0)
MPV: 10.9 fL (ref 8.6–12.4)
Platelets: 204 10*3/uL (ref 150–400)
RBC: 4.92 MIL/uL (ref 4.22–5.81)
RDW: 13.7 % (ref 11.5–15.5)
WBC: 5.8 10*3/uL (ref 4.0–10.5)

## 2015-12-31 LAB — TSH: TSH: 1.13 mIU/L (ref 0.40–4.50)

## 2015-12-31 NOTE — Patient Instructions (Signed)
Your physician has requested that you have a cardiac catheterization @ [facility] THIS WEEK. Cardiac catheterization is used to diagnose and/or treat various heart conditions. Doctors may recommend this procedure for a number of different reasons. The most common reason is to evaluate chest pain. Chest pain can be a symptom of coronary artery disease (CAD), and cardiac catheterization can show whether plaque is narrowing or blocking your heart's arteries. This procedure is also used to evaluate the valves, as well as measure the blood flow and oxygen levels in different parts of your heart. For further information please visit [email or web address]. Please follow instruction sheet, as given.  Following your catheterization, you will not be allowed to drive for 3 days.  No lifting, pushing, or pulling greater that 10 pounds is allowed for 1 week.  You will be required to have the following tests prior to the procedure:  1. Blood work - the blood work can be done no more than 7 days prior to the procedure.  It can be done at any [facility] lab. There is a lab downstairs on the first floor of this building in [address] and one at [address].  2. Chest X-ray - this can be done at [facility] in the [facility] @ [address]  Please schedule a cath follow up with Dr. Rennis GoldenHilty 2-3 weeks after the heart catheterization.

## 2015-12-31 NOTE — Progress Notes (Signed)
OFFICE NOTE  Chief Complaint:  Chest pain, second opinion  Primary Care Physician: Baruch GoutyMelinda [name], MD  HPI:  [name] White is a 45 y.o. male who was recently seen by his primary care provider and was complaining of some upper midepigastric and chest discomfort. Subsequently developed some tightness across his chest and presented to the emergency department at [facility]. He was evaluated and ruled out for acute coronary syndrome. He was seen in the ER by Dr. Welton FlakesKhan, who advised he would be seeing him back in the office later in the afternoon. [name] White did follow-up with him and a stress test and echocardiogram were ordered. We receive those records today including a stress test performed on 12/24/2015. This demonstrated an EF of 70% and was an exercise study with 7 minutes of exercise. There was ischemia noted in the LAD territory with normal LV function. It was described as a moderate medium sized, moderate intensity septal wall defect. This is based on review of the report, but direct image review was not available. An echocardiogram was performed as well demonstrated EF of 60%, mildly dilated left atrium, mild septal hypokinesis, mild LVH with grade 1 diastolic dysfunction, mild TR and pulmonary hypertension which is mild and trace to mild mitral regurgitation. His blood pressure was elevated and was recommended that additional blood pressure medicine be added. CT coronary angiogram was also recommended. [name] White reports that he did not see the physician and spoke with his family who recommended [facility] based on positive experience in the past for a second opinion. [name] White is a long time smoker. 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  PMHx:  Past Medical  History  Diagnosis Date  . Arthritis   . Avascular necrosis (HCC)   . IFG (impaired fasting glucose)   . Hyperlipidemia   . Hypertension   . Depression   . Anxiety   . GERD (gastroesophageal reflux disease)   . Overweight   . History of alcohol abuse   . Alcohol abuse, in remission     Past Surgical History  Procedure Laterality Date  . Carpal tunnel release Bilateral     right x 2, left x 1  . Testicle torsion reduction    . 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 Vasectomy  1995  . Vasectomy reversal  1997    FAMHx:  Family History  Problem Relation Age of Onset  . Hypertension Mother   . Diabetes Mother   . Hypertension Father   . Stroke Father   . Cancer Neg Hx   . COPD Neg Hx   . Heart disease Neg Hx     SOCHx:   reports that he has been smoking Cigarettes.  He has a 30 pack-year smoking history. He has never used smokeless tobacco. He reports that he does not drink alcohol or use illicit drugs.  ALLERGIES:  Allergies  Allergen Reactions  . Ace Inhibitors Swelling  . Captopril Anaphylaxis  . Enalapril Anaphylaxis  . Fosinopril Anaphylaxis  . Lisinopril Anaphylaxis  . Ramipril Anaphylaxis    ROS: Pertinent items noted in HPI  and remainder of comprehensive ROS otherwise negative.  HOME MEDS: Current Outpatient Prescriptions  Medication Sig Dispense Refill  . amLODipine (NORVASC) 10 MG tablet Take 1 tablet (10 mg total) by mouth every morning. 30 tablet 6  . aspirin EC 81 MG tablet Take 81 mg by mouth daily.    . metoprolol succinate (TOPROL-XL) 100 MG 24 hr tablet Take 1 tablet (100 mg total) by mouth daily. Take with or immediately following a meal. 30 tablet 6  . omeprazole (PRILOSEC) 20 MG capsule Take 20 mg by mouth daily as needed.    . pantoprazole (PROTONIX) 40 MG tablet Take 1 tablet (40 mg  total) by mouth daily. 30 tablet 1  . sucralfate (CARAFATE) 1 g tablet Take 1 g by mouth. Take 1 tab by mouth 3 times a day with meals and 1 tab at bedtime    . venlafaxine XR (EFFEXOR-XR) 37.5 MG 24 hr capsule Take 1 capsule (37.5 mg total) by mouth daily with breakfast. 30 capsule 6   No current facility-administered medications for this visit.    LABS/IMAGING: No results found for this or any previous visit (from the past 48 hour(s)). No results found.  WEIGHTS: Wt Readings from Last 3 Encounters:  12/31/15 177 lb 6 oz (80.457 kg)  11/06/15 166 lb (75.297 kg)  05/17/15 175 lb 12.8 oz (79.742 kg)    VITALS: BP 126/90 mmHg  Pulse 58  Ht  (1.753 m)  Wt 177 lb 6 oz (80.457 kg)  BMI 26.18 kg/m2  EXAM: General appearance: alert and no distress Neck: no carotid bruit and no JVD Lungs: clear to auscultation bilaterally Heart: regular rate and rhythm, S1, S2 normal, no murmur, click, rub or gallop Abdomen: soft, non-tender; bowel sounds normal; no masses,  no organomegaly Extremities: extremities normal, atraumatic, no cyanosis or edema Pulses: 2+ and symmetric Skin: Skin color, texture, turgor normal. No rashes or lesions Neurologic: Grossly normal Psych: Pleasant  EKG: Sinus bradycardia 58  ASSESSMENT: 1. Chest pain with typical and atypical features 2. Abnormal nuclear stress test with EF 70% and moderate sized and intensity reversible septal defect 3. Hypertensive heart disease with mildly dilated LA, mild LVH and grade 1 diastolic dysfunction, EF 68% by echo 4. Long-standing tobacco abuse 5. History of alcohol abuse-intermittent 6. GERD 7. Fatigue/nonrestorative sleep 8. Anxiety  PLAN: 1.   [name] White is describing chest pain with some typical and atypical features. There is a certain pleuritic component which may be related to smoking and possible pleurisy. There is also a lifting component which is worse with exertion and relieved by rest. His stress test is  abnormal suggestive of a moderate size reversible defect. His echo also demonstrated septal hypokinesis in the area of abnormality. While this could be artifactual, given his ongoing symptoms, he needs a coronary artery evaluation. It was recommended that he have a coronary CT, however, this is not always a definitive study. Coronary angiography would provide definitive evaluation of coronaries and the ability to intervene if necessary. I'm recommending a left heart catheterization. I did discuss risks and benefits of the procedure with him in the office at length today as well as alternatives. We discussed timing of the study and he wishes to have it as soon as possible. I advised that although I do perform these procedures, my schedule will not allow it this week and he is more interested in getting his catheterization done as soon as possible. We will arrange for cardiac catheterization with one of  my partners this week. I will plan to see him back after that procedure and work with him on smoking cessation. His primary care provider and also recommended a sleep study which I agree with as he does have poor sleep at night and feels fatigued throughout the day. This is not been arranged but we could consider arranging this after his procedure.  Chrystie Nose, MD, [facility] Attending Cardiologist [facility]  Chrystie Nose 12/31/2015, 9:49 AM

## 2016-01-01 ENCOUNTER — Telehealth: Payer: Self-pay | Admitting: Internal Medicine

## 2016-01-01 DIAGNOSIS — I209 Angina pectoris, unspecified: Secondary | ICD-10-CM | POA: Diagnosis present

## 2016-01-01 LAB — APTT: aPTT: 27 seconds (ref 24–37)

## 2016-01-01 LAB — PROTIME-INR
INR: 0.91 (ref <1.50)
Prothrombin Time: 12.3 seconds (ref 11.6–15.2)

## 2016-01-01 MED ORDER — POTASSIUM CHLORIDE CRYS ER 20 MEQ PO TBCR: 40.0000 meq | EXTENDED_RELEASE_TABLET | Freq: Every day | ORAL | Status: DC

## 2016-01-01 MED ORDER — AZITHROMYCIN 250 MG PO TABS: ORAL_TABLET | ORAL | Status: DC

## 2016-01-01 NOTE — Telephone Encounter (Signed)
Dr. Rennis GoldenHilty spoke with patient regarding lab/CXR results Z-pak & potassium 40mEq sent to pharmacy per MD

## 2016-01-02 ENCOUNTER — Ambulatory Visit (HOSPITAL_COMMUNITY)
Admission: RE | Admit: 2016-01-02 | Discharge: 2016-01-02 | Disposition: A | Discharge status: Home / Self Care | Payer: 59 | Source: Ambulatory Visit | Attending: Cardiology | Admitting: Cardiology

## 2016-01-02 ENCOUNTER — Encounter (HOSPITAL_COMMUNITY): Payer: Self-pay | Admitting: Cardiology

## 2016-01-02 ENCOUNTER — Encounter (HOSPITAL_COMMUNITY)
Admission: RE | Disposition: A | Discharge status: Home / Self Care | Payer: Self-pay | Source: Ambulatory Visit | Attending: Cardiology

## 2016-01-02 DIAGNOSIS — F419 Anxiety disorder, unspecified: Secondary | ICD-10-CM | POA: Diagnosis not present

## 2016-01-02 DIAGNOSIS — I340 Nonrheumatic mitral (valve) insufficiency: Secondary | ICD-10-CM | POA: Diagnosis not present

## 2016-01-02 DIAGNOSIS — M1990 Unspecified osteoarthritis, unspecified site: Secondary | ICD-10-CM | POA: Insufficient documentation

## 2016-01-02 DIAGNOSIS — Z6826 Body mass index (BMI) 26.0-26.9, adult: Secondary | ICD-10-CM | POA: Diagnosis not present

## 2016-01-02 DIAGNOSIS — E663 Overweight: Secondary | ICD-10-CM | POA: Diagnosis not present

## 2016-01-02 DIAGNOSIS — F329 Major depressive disorder, single episode, unspecified: Secondary | ICD-10-CM | POA: Diagnosis not present

## 2016-01-02 DIAGNOSIS — R0789 Other chest pain: Secondary | ICD-10-CM | POA: Insufficient documentation

## 2016-01-02 DIAGNOSIS — F1021 Alcohol dependence, in remission: Secondary | ICD-10-CM | POA: Insufficient documentation

## 2016-01-02 DIAGNOSIS — E785 Hyperlipidemia, unspecified: Secondary | ICD-10-CM | POA: Diagnosis not present

## 2016-01-02 DIAGNOSIS — R9439 Abnormal result of other cardiovascular function study: Secondary | ICD-10-CM | POA: Diagnosis present

## 2016-01-02 DIAGNOSIS — I25119 Atherosclerotic heart disease of native coronary artery with unspecified angina pectoris: Secondary | ICD-10-CM

## 2016-01-02 DIAGNOSIS — I272 Other secondary pulmonary hypertension: Secondary | ICD-10-CM | POA: Insufficient documentation

## 2016-01-02 DIAGNOSIS — Z7982 Long term (current) use of aspirin: Secondary | ICD-10-CM | POA: Diagnosis not present

## 2016-01-02 DIAGNOSIS — R079 Chest pain, unspecified: Secondary | ICD-10-CM | POA: Insufficient documentation

## 2016-01-02 DIAGNOSIS — I209 Angina pectoris, unspecified: Secondary | ICD-10-CM | POA: Diagnosis present

## 2016-01-02 DIAGNOSIS — F17210 Nicotine dependence, cigarettes, uncomplicated: Secondary | ICD-10-CM | POA: Insufficient documentation

## 2016-01-02 DIAGNOSIS — Z8249 Family history of ischemic heart disease and other diseases of the circulatory system: Secondary | ICD-10-CM | POA: Insufficient documentation

## 2016-01-02 DIAGNOSIS — I110 Hypertensive heart disease with heart failure: Secondary | ICD-10-CM | POA: Diagnosis not present

## 2016-01-02 DIAGNOSIS — K219 Gastro-esophageal reflux disease without esophagitis: Secondary | ICD-10-CM | POA: Diagnosis not present

## 2016-01-02 DIAGNOSIS — R7301 Impaired fasting glucose: Secondary | ICD-10-CM | POA: Insufficient documentation

## 2016-01-02 DIAGNOSIS — I2510 Atherosclerotic heart disease of native coronary artery without angina pectoris: Secondary | ICD-10-CM | POA: Diagnosis not present

## 2016-01-02 DIAGNOSIS — I5032 Chronic diastolic (congestive) heart failure: Secondary | ICD-10-CM | POA: Diagnosis not present

## 2016-01-02 DIAGNOSIS — I10 Essential (primary) hypertension: Secondary | ICD-10-CM | POA: Diagnosis present

## 2016-01-02 HISTORY — PX: CARDIAC CATHETERIZATION: SHX172

## 2016-01-02 LAB — BASIC METABOLIC PANEL
Anion gap: 14 (ref 5–15)
BUN: 12 mg/dL (ref 6–20)
CO2: 23 mmol/L (ref 22–32)
Calcium: 9 mg/dL (ref 8.9–10.3)
Chloride: 105 mmol/L (ref 101–111)
Creatinine, Ser: 0.72 mg/dL (ref 0.61–1.24)
GFR calc Af Amer: >60 mL/min (ref >60)
GFR calc non Af Amer: >60 mL/min (ref >60)
Glucose, Bld: 109 mg/dL — ABNORMAL HIGH (ref 65–99)
Potassium: 3 mmol/L — ABNORMAL LOW (ref 3.5–5.1)
Sodium: 142 mmol/L (ref 135–145)

## 2016-01-02 SURGERY — LEFT HEART CATH AND CORONARY ANGIOGRAPHY

## 2016-01-02 MED ORDER — POTASSIUM CHLORIDE 20 MEQ/15ML (10%) PO SOLN
40.0000 meq | Freq: Every day | ORAL | Status: DC
  Administered 2016-01-02: 40 meq via ORAL

## 2016-01-02 MED ORDER — POTASSIUM CHLORIDE 20 MEQ/15ML (10%) PO SOLN
ORAL | Status: AC
  Filled 2016-01-02: qty 30

## 2016-01-02 MED ORDER — HEPARIN (PORCINE) IN NACL 2-0.9 UNIT/ML-% IJ SOLN
INTRAMUSCULAR | Status: AC
  Filled 2016-01-02: qty 1000

## 2016-01-02 MED ORDER — HEPARIN SODIUM (PORCINE) 1000 UNIT/ML IJ SOLN
INTRAMUSCULAR | Status: DC | PRN
  Administered 2016-01-02: 4000 [IU] via INTRAVENOUS

## 2016-01-02 MED ORDER — POTASSIUM CHLORIDE CRYS ER 20 MEQ PO TBCR
40.0000 meq | EXTENDED_RELEASE_TABLET | Freq: Once | ORAL | Status: DC
  Filled 2016-01-02: qty 2

## 2016-01-02 MED ORDER — POTASSIUM CHLORIDE 10 MEQ/100ML IV SOLN
10.0000 meq | Freq: Once | INTRAVENOUS | Status: AC
  Administered 2016-01-02: 10 meq via INTRAVENOUS
  Filled 2016-01-02: qty 100

## 2016-01-02 MED ORDER — POTASSIUM CHLORIDE 10 MEQ/100ML IV SOLN
INTRAVENOUS | Status: AC
  Filled 2016-01-02: qty 100

## 2016-01-02 MED ORDER — SODIUM CHLORIDE 0.9 % IV SOLN: INTRAVENOUS | Status: DC

## 2016-01-02 MED ORDER — VERAPAMIL HCL 2.5 MG/ML IV SOLN
INTRAVENOUS | Status: AC
  Filled 2016-01-02: qty 2

## 2016-01-02 MED ORDER — ASPIRIN 81 MG PO CHEW
CHEWABLE_TABLET | ORAL | Status: AC
  Filled 2016-01-02: qty 1

## 2016-01-02 MED ORDER — SODIUM CHLORIDE 0.9% FLUSH: 3.0000 mL | INTRAVENOUS | Status: DC | PRN

## 2016-01-02 MED ORDER — NITROGLYCERIN 1 MG/10 ML FOR IR/CATH LAB
INTRA_ARTERIAL | Status: AC
  Filled 2016-01-02: qty 10

## 2016-01-02 MED ORDER — MIDAZOLAM HCL 2 MG/2ML IJ SOLN
INTRAMUSCULAR | Status: DC | PRN
  Administered 2016-01-02: 2 mg via INTRAVENOUS
  Administered 2016-01-02: 1 mg via INTRAVENOUS

## 2016-01-02 MED ORDER — VERAPAMIL HCL 2.5 MG/ML IV SOLN
INTRAVENOUS | Status: DC | PRN
  Administered 2016-01-02: 09:00:00 via INTRA_ARTERIAL

## 2016-01-02 MED ORDER — HEPARIN SODIUM (PORCINE) 1000 UNIT/ML IJ SOLN
INTRAMUSCULAR | Status: AC
  Filled 2016-01-02: qty 1

## 2016-01-02 MED ORDER — MIDAZOLAM HCL 2 MG/2ML IJ SOLN
INTRAMUSCULAR | Status: AC
  Filled 2016-01-02: qty 2

## 2016-01-02 MED ORDER — ACETAMINOPHEN 325 MG PO TABS: 650.0000 mg | ORAL_TABLET | ORAL | Status: DC | PRN

## 2016-01-02 MED ORDER — NITROGLYCERIN 1 MG/10 ML FOR IR/CATH LAB
INTRA_ARTERIAL | Status: DC | PRN
  Administered 2016-01-02: 100 ug

## 2016-01-02 MED ORDER — SODIUM CHLORIDE 0.9 % WEIGHT BASED INFUSION: 3.0000 mL/kg/h | INTRAVENOUS | Status: DC

## 2016-01-02 MED ORDER — ONDANSETRON HCL 4 MG/2ML IJ SOLN: 4.0000 mg | Freq: Four times a day (QID) | INTRAMUSCULAR | Status: DC | PRN

## 2016-01-02 MED ORDER — LIDOCAINE HCL (PF) 1 % IJ SOLN
INTRAMUSCULAR | Status: DC | PRN
  Administered 2016-01-02: 2 mL via SUBCUTANEOUS

## 2016-01-02 MED ORDER — FENTANYL CITRATE (PF) 100 MCG/2ML IJ SOLN
INTRAMUSCULAR | Status: DC | PRN
  Administered 2016-01-02: 50 ug via INTRAVENOUS
  Administered 2016-01-02: 25 ug via INTRAVENOUS

## 2016-01-02 MED ORDER — FENTANYL CITRATE (PF) 100 MCG/2ML IJ SOLN
INTRAMUSCULAR | Status: AC
  Filled 2016-01-02: qty 2

## 2016-01-02 MED ORDER — SODIUM CHLORIDE 0.9 % IV SOLN: 250.0000 mL | INTRAVENOUS | Status: DC | PRN

## 2016-01-02 MED ORDER — HEPARIN (PORCINE) IN NACL 2-0.9 UNIT/ML-% IJ SOLN
INTRAMUSCULAR | Status: DC | PRN
  Administered 2016-01-02: 1000 mL

## 2016-01-02 MED ORDER — IOPAMIDOL (ISOVUE-370) INJECTION 76%
INTRAVENOUS | Status: DC | PRN
  Administered 2016-01-02: 90 mL

## 2016-01-02 MED ORDER — LIDOCAINE HCL (PF) 1 % IJ SOLN
INTRAMUSCULAR | Status: AC
  Filled 2016-01-02: qty 30

## 2016-01-02 MED ORDER — ASPIRIN 81 MG PO CHEW
81.0000 mg | CHEWABLE_TABLET | ORAL | Status: AC
  Administered 2016-01-02: 81 mg via ORAL

## 2016-01-02 MED ORDER — SODIUM CHLORIDE 0.9% FLUSH: 3.0000 mL | Freq: Two times a day (BID) | INTRAVENOUS | Status: DC

## 2016-01-02 SURGICAL SUPPLY — 13 items
CATH INFINITI 5FR ANG PIGTAIL (CATHETERS) ×2 IMPLANT
CATH OPTITORQUE TIG 4.0 5F (CATHETERS) ×2 IMPLANT
COVER PRB 48X5XTLSCP FOLD TPE (BAG) IMPLANT
COVER PROBE 5X48 (BAG) ×3
DEVICE RAD COMP TR BAND LRG (VASCULAR PRODUCTS) ×2 IMPLANT
GLIDESHEATH SLEND A-KIT 6F 22G (SHEATH) ×2 IMPLANT
GLIDESHEATH SLEND SS 6F .021 (SHEATH) ×2 IMPLANT
KIT HEART LEFT (KITS) ×3 IMPLANT
PACK CARDIAC CATHETERIZATION (CUSTOM PROCEDURE TRAY) ×3 IMPLANT
SYR MEDRAD MARK V 150ML (SYRINGE) ×3 IMPLANT
TRANSDUCER W/STOPCOCK (MISCELLANEOUS) ×3 IMPLANT
TUBING CIL FLEX 10 FLL-RA (TUBING) ×3 IMPLANT
WIRE SAFE-T 1.5MM-J .035X260CM (WIRE) ×2 IMPLANT

## 2016-01-02 NOTE — Discharge Instructions (Signed)
Radial Site Care °Refer to this sheet in the next few weeks. These instructions provide you with information about caring for yourself after your procedure. Your health care provider may also give you more specific instructions. Your treatment has been planned according to current medical practices, but problems sometimes occur. Call your health care provider if you have any problems or questions after your procedure. °WHAT TO EXPECT AFTER THE PROCEDURE °After your procedure, it is typical to have the following: °· Bruising at the radial site that usually fades within 1-2 weeks. °· Blood collecting in the tissue (hematoma) that may be painful to the touch. It should usually decrease in size and tenderness within 1-2 weeks. °HOME CARE INSTRUCTIONS °· Take medicines only as directed by your health care provider. °· You may shower 24-48 hours after the procedure or as directed by your health care provider. Remove the bandage (dressing) and gently wash the site with plain soap and water. Pat the area dry with a clean towel. Do not rub the site, because this may cause bleeding. °· Do not take baths, swim, or use a hot tub until your health care provider approves. °· Check your insertion site every day for redness, swelling, or drainage. °· Do not apply powder or lotion to the site. °· Do not flex or bend the affected arm for 24 hours or as directed by your health care provider. °· Do not push or pull heavy objects with the affected arm for 24 hours or as directed by your health care provider. °· Do not lift over 10 lb (4.5 kg) for 5 days after your procedure or as directed by your health care provider. °· Ask your health care provider when it is okay to: °¨ Return to work or school. °¨ Resume usual physical activities or sports. °¨ Resume sexual activity. °· Do not drive home if you are discharged the same day as the procedure. Have someone else drive you. °· You may drive 24 hours after the procedure unless otherwise  instructed by your health care provider. °· Do not operate machinery or power tools for 24 hours after the procedure. °· If your procedure was done as an outpatient procedure, which means that you went home the same day as your procedure, a responsible adult should be with you for the first 24 hours after you arrive home. °· Keep all follow-up visits as directed by your health care provider. This is important. °SEEK MEDICAL CARE IF: °· You have a fever. °· You have chills. °· You have increased bleeding from the radial site. Hold pressure on the site and call 911. °SEEK IMMEDIATE MEDICAL CARE IF: °· You have unusual pain at the radial site. °· You have redness, warmth, or swelling at the radial site. °· You have drainage (other than a small amount of blood on the dressing) from the radial site. °· The radial site is bleeding, and the bleeding does not stop after 30 minutes of holding steady pressure on the site. °· Your arm or hand becomes pale, cool, tingly, or numb. °  °This information is not intended to replace advice given to you by your health care provider. Make sure you discuss any questions you have with your health care provider. °  °Document Released: 10/24/2010 Document Revised: 10/12/2014 Document Reviewed: 04/09/2014 °Elsevier Interactive Patient Education ©2016 [employer] ° °

## 2016-01-02 NOTE — H&P (View-Only) (Signed)
  OFFICE NOTE  Chief Complaint:  Chest pain, second opinion  Primary Care Physician: Melinda [name], MD  HPI:  [name] [name] is a 45 y.o. male who was recently seen by his primary care provider and was complaining of some upper midepigastric and chest discomfort. Subsequently developed some tightness across his chest and presented to the emergency department at [facility]. He was evaluated and ruled out for acute coronary syndrome. He was seen in the ER by Dr. Khan, who advised he would be seeing him back in the office later in the afternoon. [name] [name] did follow-up with him and a stress test and echocardiogram were ordered. We receive those records today including a stress test performed on 12/24/2015. This demonstrated an EF of 70% and was an exercise study with 7 minutes of exercise. There was ischemia noted in the LAD territory with normal LV function. It was described as a moderate medium sized, moderate intensity septal wall defect. This is based on review of the report, but direct image review was not available. An echocardiogram was performed as well demonstrated EF of 60%, mildly dilated left atrium, mild septal hypokinesis, mild LVH with grade 1 diastolic dysfunction, mild TR and pulmonary hypertension which is mild and trace to mild mitral regurgitation. His blood pressure was elevated and was recommended that additional blood pressure medicine be added. CT coronary angiogram was also recommended. [name] [name] reports that he did not see the physician and spoke with his family who recommended [facility] based on positive experience in the past for a second opinion. [name] [name] is a long time smoker. Although he does report some of his chest pain is worse with sneezing or coughing, he does also work in landscaping and says that he has more chest discomfort when unloading heavy objects from the back of a pickup truck or doing other lifting activities.  PMHx:  Past Medical  History  Diagnosis Date  . Arthritis   . Avascular necrosis (HCC)   . IFG (impaired fasting glucose)   . Hyperlipidemia   . Hypertension   . Depression   . Anxiety   . GERD (gastroesophageal reflux disease)   . Overweight   . History of alcohol abuse   . Alcohol abuse, in remission     Past Surgical History  Procedure Laterality Date  . Carpal tunnel release Bilateral     right x 2, left x 1  . Testicle torsion reduction    . Total hip arthroplasty Left 01/30/2014    Procedure: LEFT TOTAL HIP ARTHROPLASTY ANTERIOR APPROACH;  Surgeon: Matthew D Olin, MD;  Location: [facility];  Service: Orthopedics;  Laterality: Left;  . Vasectomy  1995  . Vasectomy reversal  1997    FAMHx:  Family History  Problem Relation Age of Onset  . Hypertension Mother   . Diabetes Mother   . Hypertension Father   . Stroke Father   . Cancer Neg Hx   . COPD Neg Hx   . Heart disease Neg Hx     SOCHx:   reports that he has been smoking Cigarettes.  He has a 30 pack-year smoking history. He has never used smokeless tobacco. He reports that he does not drink alcohol or use illicit drugs.  ALLERGIES:  Allergies  Allergen Reactions  . Ace Inhibitors Swelling  . Captopril Anaphylaxis  . Enalapril Anaphylaxis  . Fosinopril Anaphylaxis  . Lisinopril Anaphylaxis  . Ramipril Anaphylaxis    ROS: Pertinent items noted in HPI   and remainder of comprehensive ROS otherwise negative.  HOME MEDS: Current Outpatient Prescriptions  Medication Sig Dispense Refill  . amLODipine (NORVASC) 10 MG tablet Take 1 tablet (10 mg total) by mouth every morning. 30 tablet 6  . aspirin EC 81 MG tablet Take 81 mg by mouth daily.    . metoprolol succinate (TOPROL-XL) 100 MG 24 hr tablet Take 1 tablet (100 mg total) by mouth daily. Take with or immediately following a meal. 30 tablet 6  . omeprazole (PRILOSEC) 20 MG capsule Take 20 mg by mouth daily as needed.    . pantoprazole (PROTONIX) 40 MG tablet Take 1 tablet (40 mg  total) by mouth daily. 30 tablet 1  . sucralfate (CARAFATE) 1 g tablet Take 1 g by mouth. Take 1 tab by mouth 3 times a day with meals and 1 tab at bedtime    . venlafaxine XR (EFFEXOR-XR) 37.5 MG 24 hr capsule Take 1 capsule (37.5 mg total) by mouth daily with breakfast. 30 capsule 6   No current facility-administered medications for this visit.    LABS/IMAGING: No results found for this or any previous visit (from the past 48 hour(s)). No results found.  WEIGHTS: Wt Readings from Last 3 Encounters:  12/31/15 177 lb 6 oz (80.457 kg)  11/06/15 166 lb (75.297 kg)  05/17/15 175 lb 12.8 oz (79.742 kg)    VITALS: BP 126/90 mmHg  Pulse 58  Ht 5' 9" (1.753 m)  Wt 177 lb 6 oz (80.457 kg)  BMI 26.18 kg/m2  EXAM: General appearance: alert and no distress Neck: no carotid bruit and no JVD Lungs: clear to auscultation bilaterally Heart: regular rate and rhythm, S1, S2 normal, no murmur, click, rub or gallop Abdomen: soft, non-tender; bowel sounds normal; no masses,  no organomegaly Extremities: extremities normal, atraumatic, no cyanosis or edema Pulses: 2+ and symmetric Skin: Skin color, texture, turgor normal. No rashes or lesions Neurologic: Grossly normal Psych: Pleasant  EKG: Sinus bradycardia 58  ASSESSMENT: 1. Chest pain with typical and atypical features 2. Abnormal nuclear stress test with EF 70% and moderate sized and intensity reversible septal defect 3. Hypertensive heart disease with mildly dilated LA, mild LVH and grade 1 diastolic dysfunction, EF 68% by echo 4. Long-standing tobacco abuse 5. History of alcohol abuse-intermittent 6. GERD 7. Fatigue/nonrestorative sleep 8. Anxiety  PLAN: 1.   [name] [name] is describing chest pain with some typical and atypical features. There is a certain pleuritic component which may be related to smoking and possible pleurisy. There is also a lifting component which is worse with exertion and relieved by rest. His stress test is  abnormal suggestive of a moderate size reversible defect. His echo also demonstrated septal hypokinesis in the area of abnormality. While this could be artifactual, given his ongoing symptoms, he needs a coronary artery evaluation. It was recommended that he have a coronary CT, however, this is not always a definitive study. Coronary angiography would provide definitive evaluation of coronaries and the ability to intervene if necessary. I'm recommending a left heart catheterization. I did discuss risks and benefits of the procedure with him in the office at length today as well as alternatives. We discussed timing of the study and he wishes to have it as soon as possible. I advised that although I do perform these procedures, my schedule will not allow it this week and he is more interested in getting his catheterization done as soon as possible. We will arrange for cardiac catheterization with one of   my partners this week. I will plan to see him back after that procedure and work with him on smoking cessation. His primary care provider and also recommended a sleep study which I agree with as he does have poor sleep at night and feels fatigued throughout the day. This is not been arranged but we could consider arranging this after his procedure.  Eduar Kumpf C. Kieryn Burtis, MD, FACC Attending Cardiologist [facility]  Tanith Dagostino C Romualdo Prosise 12/31/2015, 9:49 AM  

## 2016-01-02 NOTE — Research (Signed)
CADLAD Informed Consent   Subject Name: [name] [name]  Subject met inclusion and exclusion criteria.  The informed consent form, study requirements and expectations were reviewed with the subject and questions and concerns were addressed prior to the signing of the consent form.  The subject verbalized understanding of the trail requirements.  The subject agreed to participate in the CADLAD trial and signed the informed consent.  The informed consent was obtained prior to performance of any protocol-specific procedures for the subject.  A copy of the signed informed consent was given to the subject and a copy was placed in the subject's medical record.  Jake Bathe Jr. 01/02/2016, 4730 AM

## 2016-01-02 NOTE — Interval H&P Note (Signed)
History and Physical Interval Note:  01/02/2016 7:13 AM  [name] [name]  has presented today for surgery, with the diagnosis of cp - ? Class II-II angina with Abnormal Nuclear Stress Test.   The various methods of treatment have been discussed with the patient and family. After consideration of risks, benefits and other options for treatment, the patient has consented to  Procedure(s): Left Heart Cath and Coronary Angiography (N/A) with possible Percutaneous Coronary Intervention as a surgical intervention .  The patient's history has been reviewed, patient examined, no change in status, stable for surgery.  I have reviewed the patient's chart and labs.  Questions were answered to the patient's satisfaction.    Cath Lab Visit (complete for each Cath Lab visit)  Clinical Evaluation Leading to the Procedure:   ACS: No.  Non-ACS:    Anginal Classification: CCS II  Anti-ischemic medical therapy: Maximal Therapy (2 or more classes of medications)  Non-Invasive Test Results: High-risk stress test findings: cardiac mortality >3%/year  Prior CABG: No previous CABG    Ischemic Symptoms? CCS II (Slight limitation of ordinary activity) Anti-ischemic Medical Therapy? Maximal Medical Therapy (2 or more classes of medications) Non-invasive Test Results? High-risk stress test findings: cardiac mortality >3%/yr Prior CABG? No Previous CABG   Patient Information:   1-2V CAD, no prox LAD  A (8)  Indication: 19; Score: 8   Patient Information:   CTO of 1 vessel, no other CAD  A (7)  Indication: 29; Score: 7   Patient Information:   1V CAD with prox LAD  A (9)  Indication: 35; Score: 9   Patient Information:   2V-CAD with prox LAD  A (9)  Indication: 41; Score: 9   Patient Information:   3V-CAD without LMCA  A (9)  Indication: 47; Score: 9   Patient Information:   3V-CAD without LMCA With Abnormal LV systolic function  A (9)  Indication: 48; Score:  9   Patient Information:   LMCA-CAD  A (9)  Indication: 49; Score: 9   Patient Information:   2V-CAD with prox LAD PCI  A (7)  Indication: 62; Score: 7   Patient Information:   2V-CAD with prox LAD CABG  A (8)  Indication: 62; Score: 8   Patient Information:   3V-CAD without LMCA With Low CAD burden(i.e., 3 focal stenoses, low SYNTAX score) PCI  A (7)  Indication: 63; Score: 7   Patient Information:   3V-CAD without LMCA With Low CAD burden(i.e., 3 focal stenoses, low SYNTAX score) CABG  A (9)  Indication: 63; Score: 9   Patient Information:   3V-CAD without LMCA E06c - Intermediate-high CAD burden (i.e., multiple diffuse lesions, presence of CTO, or high SYNTAX score) PCI  U (4)  Indication: 64; Score: 4   Patient Information:   3V-CAD without LMCA E06c - Intermediate-high CAD burden (i.e., multiple diffuse lesions, presence of CTO, or high SYNTAX score) CABG  A (9)  Indication: 64; Score: 9   Patient Information:   LMCA-CAD With Isolated LMCA stenosis  PCI  U (6)  Indication: 65; Score: 6   Patient Information:   LMCA-CAD With Isolated LMCA stenosis  CABG  A (9)  Indication: 65; Score: 9   Patient Information:   LMCA-CAD Additional CAD, low CAD burden (i.e., 1- to 2-vessel additional involvement, low SYNTAX score) PCI  U (5)  Indication: 66; Score: 5   Patient Information:   LMCA-CAD Additional CAD, low CAD burden (i.e., 1- to 2-vessel additional involvement,  low SYNTAX score) CABG  A (9)  Indication: 66; Score: 9   Patient Information:   LMCA-CAD Additional CAD, intermediate-high CAD burden (i.e., 3-vessel involvement, presence of CTO, or high SYNTAX score) PCI  I (3)  Indication: 67; Score: 3   Patient Information:   LMCA-CAD Additional CAD, intermediate-high CAD burden (i.e., 3-vessel involvement, presence of CTO, or high SYNTAX score) CABG  A (9)  Indication: 67; Score: 9    [name], [name]  W

## 2016-01-10 ENCOUNTER — Other Ambulatory Visit: Payer: Self-pay | Admitting: *Deleted

## 2016-01-10 DIAGNOSIS — R9389 Abnormal findings on diagnostic imaging of other specified body structures: Secondary | ICD-10-CM

## 2016-01-22 ENCOUNTER — Telehealth: Payer: Self-pay | Admitting: Internal Medicine

## 2016-01-23 NOTE — Telephone Encounter (Signed)
Close encounters °

## 2016-01-24 ENCOUNTER — Ambulatory Visit: Payer: Managed Care, Other (non HMO) | Admitting: Internal Medicine

## 2016-02-19 ENCOUNTER — Inpatient Hospital Stay
Admission: EM | Admit: 2016-02-19 | Discharge: 2016-02-20 | DRG: 683 | Disposition: A | Discharge status: Home / Self Care | Payer: Self-pay | Attending: Internal Medicine | Admitting: Internal Medicine

## 2016-02-19 DIAGNOSIS — Z96642 Presence of left artificial hip joint: Secondary | ICD-10-CM | POA: Diagnosis present

## 2016-02-19 DIAGNOSIS — N289 Disorder of kidney and ureter, unspecified: Secondary | ICD-10-CM

## 2016-02-19 DIAGNOSIS — M1990 Unspecified osteoarthritis, unspecified site: Secondary | ICD-10-CM | POA: Diagnosis present

## 2016-02-19 DIAGNOSIS — E785 Hyperlipidemia, unspecified: Secondary | ICD-10-CM | POA: Diagnosis present

## 2016-02-19 DIAGNOSIS — Z7982 Long term (current) use of aspirin: Secondary | ICD-10-CM

## 2016-02-19 DIAGNOSIS — Z888 Allergy status to other drugs, medicaments and biological substances status: Secondary | ICD-10-CM

## 2016-02-19 DIAGNOSIS — F329 Major depressive disorder, single episode, unspecified: Secondary | ICD-10-CM | POA: Diagnosis present

## 2016-02-19 DIAGNOSIS — E876 Hypokalemia: Secondary | ICD-10-CM | POA: Diagnosis present

## 2016-02-19 DIAGNOSIS — E871 Hypo-osmolality and hyponatremia: Secondary | ICD-10-CM | POA: Diagnosis present

## 2016-02-19 DIAGNOSIS — K219 Gastro-esophageal reflux disease without esophagitis: Secondary | ICD-10-CM | POA: Diagnosis present

## 2016-02-19 DIAGNOSIS — N179 Acute kidney failure, unspecified: Principal | ICD-10-CM | POA: Diagnosis present

## 2016-02-19 DIAGNOSIS — F17210 Nicotine dependence, cigarettes, uncomplicated: Secondary | ICD-10-CM | POA: Diagnosis present

## 2016-02-19 DIAGNOSIS — M6282 Rhabdomyolysis: Secondary | ICD-10-CM | POA: Diagnosis present

## 2016-02-19 DIAGNOSIS — E860 Dehydration: Secondary | ICD-10-CM | POA: Diagnosis present

## 2016-02-19 DIAGNOSIS — R252 Cramp and spasm: Secondary | ICD-10-CM

## 2016-02-19 DIAGNOSIS — I10 Essential (primary) hypertension: Secondary | ICD-10-CM | POA: Diagnosis present

## 2016-02-19 DIAGNOSIS — Z79899 Other long term (current) drug therapy: Secondary | ICD-10-CM

## 2016-02-19 DIAGNOSIS — F419 Anxiety disorder, unspecified: Secondary | ICD-10-CM | POA: Diagnosis present

## 2016-02-19 LAB — URINALYSIS COMPLETE WITH MICROSCOPIC (ARMC ONLY)
Bilirubin Urine: NEGATIVE
Glucose, UA: NEGATIVE mg/dL
Ketones, ur: NEGATIVE mg/dL
Leukocytes, UA: NEGATIVE
Nitrite: NEGATIVE
Protein, ur: 100 mg/dL — AB
Specific Gravity, Urine: 1.005 (ref 1.005–1.030)
Squamous Epithelial / LPF: NONE SEEN
pH: 5 (ref 5.0–8.0)

## 2016-02-19 LAB — CBC
HCT: 57.3 % — ABNORMAL HIGH (ref 40.0–52.0)
Hemoglobin: 20.1 g/dL — ABNORMAL HIGH (ref 13.0–18.0)
MCH: 32.3 pg (ref 26.0–34.0)
MCHC: 35.1 g/dL (ref 32.0–36.0)
MCV: 92 fL (ref 80.0–100.0)
Platelets: 298 10*3/uL (ref 150–440)
RBC: 6.23 MIL/uL — ABNORMAL HIGH (ref 4.40–5.90)
RDW: 13.9 % (ref 11.5–14.5)
WBC: 9.6 10*3/uL (ref 3.8–10.6)

## 2016-02-19 LAB — BASIC METABOLIC PANEL
Anion gap: 21 — ABNORMAL HIGH (ref 5–15)
BUN: 24 mg/dL — ABNORMAL HIGH (ref 6–20)
CO2: 22 mmol/L (ref 22–32)
Calcium: 11.4 mg/dL — ABNORMAL HIGH (ref 8.9–10.3)
Chloride: 85 mmol/L — ABNORMAL LOW (ref 101–111)
Creatinine, Ser: 2.92 mg/dL — ABNORMAL HIGH (ref 0.61–1.24)
GFR calc Af Amer: 28 mL/min — ABNORMAL LOW (ref >60)
GFR calc non Af Amer: 24 mL/min — ABNORMAL LOW (ref >60)
Glucose, Bld: 107 mg/dL — ABNORMAL HIGH (ref 65–99)
Potassium: 4 mmol/L (ref 3.5–5.1)
Sodium: 128 mmol/L — ABNORMAL LOW (ref 135–145)

## 2016-02-19 LAB — CREATININE, URINE, RANDOM: Creatinine, Urine: 62 mg/dL

## 2016-02-19 LAB — CK: Total CK: 1856 U/L — ABNORMAL HIGH (ref 49–397)

## 2016-02-19 LAB — SODIUM, URINE, RANDOM: Sodium, Ur: <10 mmol/L

## 2016-02-19 MED ORDER — HEPARIN SODIUM (PORCINE) 5000 UNIT/ML IJ SOLN
INTRAMUSCULAR | Status: AC
Start: 2016-02-19 — End: 2016-02-20
  Filled 2016-02-19: qty 1

## 2016-02-19 MED ORDER — ACETAMINOPHEN 325 MG PO TABS: 650.0000 mg | ORAL_TABLET | Freq: Four times a day (QID) | ORAL | Status: DC | PRN

## 2016-02-19 MED ORDER — HEPARIN SODIUM (PORCINE) 5000 UNIT/ML IJ SOLN
5000.0000 [IU] | Freq: Three times a day (TID) | INTRAMUSCULAR | Status: DC
  Administered 2016-02-19 – 2016-02-20 (×3): 5000 [IU] via SUBCUTANEOUS
  Filled 2016-02-19 (×2): qty 1

## 2016-02-19 MED ORDER — HYDRALAZINE HCL 20 MG/ML IJ SOLN
10.0000 mg | Freq: Four times a day (QID) | INTRAMUSCULAR | Status: DC | PRN
  Administered 2016-02-19: 10 mg via INTRAVENOUS
  Filled 2016-02-19: qty 1

## 2016-02-19 MED ORDER — PANTOPRAZOLE SODIUM 40 MG PO TBEC
40.0000 mg | DELAYED_RELEASE_TABLET | Freq: Every day | ORAL | Status: DC
  Administered 2016-02-20: 40 mg via ORAL
  Filled 2016-02-19: qty 1

## 2016-02-19 MED ORDER — ASPIRIN EC 81 MG PO TBEC
81.0000 mg | DELAYED_RELEASE_TABLET | Freq: Every day | ORAL | Status: DC
  Administered 2016-02-20: 81 mg via ORAL
  Filled 2016-02-19: qty 1

## 2016-02-19 MED ORDER — METOPROLOL SUCCINATE ER 50 MG PO TB24
100.0000 mg | ORAL_TABLET | Freq: Every day | ORAL | Status: DC
  Administered 2016-02-20: 100 mg via ORAL
  Filled 2016-02-19: qty 2

## 2016-02-19 MED ORDER — AMLODIPINE BESYLATE 10 MG PO TABS
10.0000 mg | ORAL_TABLET | Freq: Every morning | ORAL | Status: DC
  Administered 2016-02-20: 10 mg via ORAL
  Filled 2016-02-19: qty 1

## 2016-02-19 MED ORDER — ONDANSETRON HCL 4 MG/2ML IJ SOLN: 4.0000 mg | Freq: Four times a day (QID) | INTRAMUSCULAR | Status: DC | PRN

## 2016-02-19 MED ORDER — ACETAMINOPHEN 650 MG RE SUPP: 650.0000 mg | Freq: Four times a day (QID) | RECTAL | Status: DC | PRN

## 2016-02-19 MED ORDER — SODIUM CHLORIDE 0.9 % IV SOLN
INTRAVENOUS | Status: DC
  Administered 2016-02-19 – 2016-02-20 (×2): via INTRAVENOUS

## 2016-02-19 MED ORDER — ONDANSETRON HCL 4 MG PO TABS: 4.0000 mg | ORAL_TABLET | Freq: Four times a day (QID) | ORAL | Status: DC | PRN

## 2016-02-19 MED ORDER — DIAZEPAM 5 MG/ML IJ SOLN
5.0000 mg | Freq: Once | INTRAMUSCULAR | Status: AC
  Administered 2016-02-19: 5 mg via INTRAVENOUS
  Filled 2016-02-19: qty 2

## 2016-02-19 MED ORDER — SODIUM CHLORIDE 0.9 % IV BOLUS (SEPSIS)
1000.0000 mL | Freq: Once | INTRAVENOUS | Status: AC
  Administered 2016-02-19: 1000 mL via INTRAVENOUS

## 2016-02-19 MED ORDER — OXYCODONE HCL 5 MG PO TABS: 5.0000 mg | ORAL_TABLET | ORAL | Status: DC | PRN

## 2016-02-19 MED ORDER — MORPHINE SULFATE (PF) 2 MG/ML IV SOLN: 2.0000 mg | INTRAVENOUS | Status: DC | PRN

## 2016-02-19 MED ORDER — SODIUM CHLORIDE 0.9% FLUSH
3.0000 mL | Freq: Two times a day (BID) | INTRAVENOUS | Status: DC
  Administered 2016-02-19: 3 mL via INTRAVENOUS

## 2016-02-19 MED ORDER — VENLAFAXINE HCL ER 37.5 MG PO CP24
37.5000 mg | ORAL_CAPSULE | Freq: Every day | ORAL | Status: DC
  Administered 2016-02-20: 37.5 mg via ORAL
  Filled 2016-02-19: qty 1

## 2016-02-19 MED ORDER — DIAZEPAM 5 MG/ML IJ SOLN
INTRAMUSCULAR | Status: AC
  Filled 2016-02-19: qty 2

## 2016-02-19 NOTE — H&P (Signed)
**Note [name]-Identified via Obfuscation** [facility] [place] at [facility]   PATIENT NAME: [name] White    MR#:  [id number]  DATE OF BIRTH:  [birth year]   DATE OF ADMISSION:  02/19/2016  PRIMARY CARE PHYSICIAN: Baruch Gouty, MD   REQUESTING/REFERRING PHYSICIAN: Sharma Covert  CHIEF COMPLAINT:   Chief Complaint  Patient presents with  . Spasms    HISTORY OF PRESENT ILLNESS:  [name] White  is a 45 y.o. male with a known history ofAvascular necrosis of the hip status post replacement, essential hypertension presenting with leg cramping. Patient describes 2 day duration of leg cramping states has a history of hypokalemia. Extra potassium supplementation with initial resolution of symptoms however then had worsening today to admission. On presentation to the hospital, be in acute renal failure. Of note patient does work outside Physical activity he has been hot and sweating more than usual but he did not feel this was an abnormality for him. He describes his leg cramping sensation nonradiating over the calves and thighs 8/10 in intensity no worsening or relieving factors PAST MEDICAL HISTORY:   Past Medical History  Diagnosis Date  . Arthritis   . Avascular necrosis (HCC)   . IFG (impaired fasting glucose)   . Hyperlipidemia   . Hypertension   . Depression   . Anxiety   . GERD (gastroesophageal reflux disease)   . Overweight   . History of alcohol abuse   . Alcohol abuse, in remission     PAST SURGICAL HISTORY:   Past Surgical History  Procedure Laterality Date  . Carpal tunnel release Bilateral     right x 2, left x 1  . Testicle torsion reduction    . Total hip arthroplasty Left 01/30/2014    Procedure: LEFT TOTAL HIP ARTHROPLASTY ANTERIOR APPROACH;  Surgeon: Shelda Pal, MD;  Location: [facility];  Service: Orthopedics;  Laterality: Left;  Marland Kitchen Vasectomy  1995  . Vasectomy reversal  1997  . Cardiac catheterization N/A 01/02/2016    Procedure: Left Heart Cath and Coronary Angiography;  Surgeon:  Marykay Lex, MD;  Location: [facility] INVASIVE CV LAB;  Service: Cardiovascular;  Laterality: N/A;    SOCIAL HISTORY:   Social History  Substance Use Topics  . Smoking status: Current Some Day Smoker -- 1.00 packs/day for 30 years    Types: Cigarettes  . Smokeless tobacco: Never Used  . Alcohol Use: No    FAMILY HISTORY:   Family History  Problem Relation Age of Onset  . Hypertension Mother   . Diabetes Mother   . Hypertension Father   . Stroke Father   . Cancer Neg Hx   . COPD Neg Hx   . Heart disease Neg Hx     DRUG ALLERGIES:   Allergies  Allergen Reactions  . Ace Inhibitors Anaphylaxis  . Captopril Anaphylaxis  . Enalapril Anaphylaxis  . Fosinopril Anaphylaxis  . Lisinopril Anaphylaxis  . Ramipril Anaphylaxis    REVIEW OF SYSTEMS:  REVIEW OF SYSTEMS:  CONSTITUTIONAL: Denies fevers, chills, fatigue, weakness.  EYES: Denies blurred vision, double vision, or eye pain.  EARS, NOSE, THROAT: Denies tinnitus, ear pain, hearing loss.  RESPIRATORY: denies cough, shortness of breath, wheezing  CARDIOVASCULAR: Denies chest pain, palpitations, edema.  GASTROINTESTINAL: Denies nausea, vomiting, diarrhea, abdominal pain.  GENITOURINARY: Denies dysuria, hematuria.  ENDOCRINE: Denies nocturia or thyroid problems. HEMATOLOGIC AND LYMPHATIC: Denies easy bruising or bleeding.  SKIN: Denies rash or lesions.  MUSCULOSKELETAL: Denies pain in neck, back, shoulder, knees, hips, or further arthritic symptoms.  NEUROLOGIC: Denies paralysis, paresthesias.  PSYCHIATRIC: Denies anxiety or depressive symptoms. Otherwise full review of systems performed by me is negative.   MEDICATIONS AT HOME:   Prior to Admission medications   Medication Sig Start Date End Date Taking? Authorizing Provider  amLODipine (NORVASC) 10 MG tablet Take 1 tablet (10 mg total) by mouth every morning. 11/06/15  Yes Kerman PasseyMelinda P [name], MD  aspirin EC 81 MG tablet Take 81 mg by mouth daily.   Yes Historical Provider,  MD  metoprolol succinate (TOPROL-XL) 100 MG 24 hr tablet Take 1 tablet (100 mg total) by mouth daily. Take with or immediately following a meal. 11/06/15  Yes Kerman PasseyMelinda P [name], MD  pantoprazole (PROTONIX) 40 MG tablet Take 1 tablet (40 mg total) by mouth daily. 12/05/15  Yes Kerman PasseyMelinda P [name], MD  potassium chloride SA (K-DUR,KLOR-CON) 20 MEQ tablet Take 20 mEq by mouth 2 (two) times daily.   Yes Historical Provider, MD  venlafaxine XR (EFFEXOR-XR) 37.5 MG 24 hr capsule Take 1 capsule (37.5 mg total) by mouth daily with breakfast. 11/06/15  Yes Kerman PasseyMelinda P [name], MD      VITAL SIGNS:  Blood pressure 112/86, pulse 92, temperature 98.1 F (36.7 C), temperature source Oral, resp. rate 18, height 5\' 9"  (1.753 m), weight 174 lb (78.926 kg), SpO2 99 %.  PHYSICAL EXAMINATION:  VITAL SIGNS: Filed Vitals:   02/19/16 1447  BP: 112/86  Pulse: 92  Temp: 98.1 F (36.7 C)  Resp: 18   GENERAL:45 y.o.male currently in no acute distress.  HEAD: Normocephalic, atraumatic.  EYES: Pupils equal, round, reactive to light. Extraocular muscles intact. No scleral icterus.  MOUTH: Moist mucosal membrane. Dentition intact. No abscess noted.  EAR, NOSE, THROAT: Clear without exudates. No external lesions.  NECK: Supple. No thyromegaly. No nodules. No JVD.  PULMONARY: Clear to ascultation, without wheeze rails or rhonci. No use of accessory muscles, Good respiratory effort. good air entry bilaterally CHEST: Nontender to palpation.  CARDIOVASCULAR: S1 and S2. Regular rate and rhythm. No murmurs, rubs, or gallops. No edema. Pedal pulses 2+ bilaterally.  GASTROINTESTINAL: Soft, nontender, nondistended. No masses. Positive bowel sounds. No hepatosplenomegaly.  MUSCULOSKELETAL: No swelling, clubbing, or edema. Range of motion full in all extremities.  NEUROLOGIC: Cranial nerves II through XII are intact. No gross focal neurological deficits. Sensation intact. Reflexes intact.  SKIN: No ulceration, lesions, rashes, or cyanosis.  Skin warm and dry. Turgor intact.  PSYCHIATRIC: Mood, affect within normal limits. The patient is awake, alert and oriented x 3. Insight, judgment intact.    LABORATORY PANEL:   CBC  Recent Labs Lab 02/19/16 1456  WBC 9.6  HGB 20.1*  HCT 57.3*  PLT 298   ------------------------------------------------------------------------------------------------------------------  Chemistries   Recent Labs Lab 02/19/16 1456  NA 128*  K 4.0  CL 85*  CO2 22  GLUCOSE 107*  BUN 24*  CREATININE 2.92*  CALCIUM 11.4*   ------------------------------------------------------------------------------------------------------------------  Cardiac Enzymes No results for input(s): TROPONINI in the last 168 hours. ------------------------------------------------------------------------------------------------------------------  RADIOLOGY:  No results found.  EKG:   Orders placed or performed during the hospital encounter of 02/19/16  . EKG 12-Lead  . EKG 12-Lead  . ED EKG  . ED EKG    IMPRESSION AND PLAN:   45 year old Caucasian gentleman history of essential hypertension found to be in acute renal failure  1. Acute renal failure secondary to rhabdomyolysis: Aggressive IV fluid hydration follow CK level urine output renal function 2. Essential hypertension: Continue continue metoprolol and Norvasc 3. GERD without  esophagitis PPI therapy 4. Venous thromboembolism prophylactic: Lovenox    All the records are reviewed and case discussed with ED provider. Management plans discussed with the patient, family and they are in agreement.  CODE STATUS: Full  TOTAL TIME TAKING CARE OF THIS PATIENT: 33 minutes.    Hower,  Mardi [place]land.D on 02/19/2016 at 4:44 PM  Between 7am to 6pm - Pager - [phone]  After 6pm: House Pager: - [phone]  [facility] [place] [facility]  Office  [phone]  CC: Primary care physician; Baruch Gouty, MD

## 2016-02-19 NOTE — ED Provider Notes (Signed)
[facility] Emergency Department Provider Note  ____________________________________________  Time seen: Approximately 3:35 PM  I have reviewed the triage vital signs and the nursing notes.   HISTORY  Chief Complaint Spasms    HPI [name] White is a 45 y.o. male history of alcoholism, HTN, HL, hypokalemia presenting with muscle cramps. 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 He has been trying to replete his fluid loss with lots of water and Gatorade. Since yesterday, he has developed severe intermittent cramping of the bilateral calves, hamstrings, bilateral thumbs and index fingers, and abdominal wall muscles. He denies any trauma or fever. He takes daily potassium supplementation, 20 mEq twice daily, and has not missed any doses.   Past Medical History  Diagnosis Date  . Arthritis   . Avascular necrosis (HCC)   . IFG (impaired fasting glucose)   . Hyperlipidemia   . Hypertension   . Depression   . Anxiety   . GERD (gastroesophageal reflux disease)   . Overweight   . History of alcohol abuse   . Alcohol abuse, in remission     Patient Active Problem List   Diagnosis Date Noted  . Chest pain, unspecified   . Angina, class II (HCC) 01/01/2016  . Chest pain 12/31/2015  . Abnormal nuclear stress test 12/31/2015  . Acute epigastric pain 11/06/2015  . Snoring 11/06/2015  . Elevated liver function tests 05/19/2015  . Fatty liver 05/19/2015  . Elevated serum GGT level 05/17/2015  . IFG (impaired fasting glucose)   . Hyperlipidemia   . Essential hypertension   . Depression   . Anxiety   . GERD (gastroesophageal reflux disease)   . Overweight   . Alcohol abuse, episodic   . 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 Carpal tunnel release Bilateral     right x 2, left x 1  . Testicle torsion reduction    .  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 Vasectomy  1995  . Vasectomy reversal  1997  . Cardiac catheterization N/A 01/02/2016    Procedure: Left Heart Cath and Coronary Angiography;  Surgeon: Marykay Lexavid W Harding, MD;  [facility];  Service: Cardiovascular;  Laterality: N/A;    Current Outpatient Rx  Name  Route  Sig  Dispense  Refill  . amLODipine (NORVASC) 10 MG tablet   Oral   Take 1 tablet (10 mg total) by mouth every morning.   30 tablet   6   . aspirin EC 81 MG tablet   Oral   Take 81 mg by mouth daily.         Marland Kitchen. azithromycin (ZITHROMAX) 250 MG tablet      Take 2 tablets (500mg ) by mouth on day one, then 1 tablet daily til finished.   6 each   0   . metoprolol succinate (TOPROL-XL) 100 MG 24 hr tablet   Oral   Take 1 tablet (100 mg total) by mouth daily. Take with or immediately following a meal.   30 tablet   6   . omeprazole (PRILOSEC) 20 MG capsule   Oral   Take 20 mg by mouth daily as needed.         .Marland Kitchen  pantoprazole (PROTONIX) 40 MG tablet   Oral   Take 1 tablet (40 mg total) by mouth daily.   30 tablet   1   . potassium chloride SA (K-DUR,KLOR-CON) 20 MEQ tablet   Oral   Take 2 tablets (40 mEq total) by mouth daily.   60 tablet   5   . sucralfate (CARAFATE) 1 g tablet   Oral   Take 1 g by mouth. Take 1 tab by mouth 3 times a day with meals and 1 tab at bedtime         . venlafaxine XR (EFFEXOR-XR) 37.5 MG 24 hr capsule   Oral   Take 1 capsule (37.5 mg total) by mouth daily with breakfast.   30 capsule   6     Allergies Ace inhibitors; Captopril; Enalapril; Fosinopril; Lisinopril; and Ramipril  Family History  Problem Relation Age of Onset  . Hypertension Mother   . Diabetes Mother   . Hypertension Father   . Stroke Father   . Cancer Neg Hx   . COPD Neg Hx   . Heart  disease Neg Hx     Social History Social History  Substance Use Topics  . Smoking status: Current Some Day Smoker -- 1.00 packs/day for 30 years    Types: Cigarettes  . Smokeless tobacco: Never Used  . Alcohol Use: No    Review of Systems Constitutional: No fever/chills. Eyes: No visual changes. ENT: No sore throat. No congestion or rhinorrhea. Cardiovascular: Denies chest pain. Denies palpitations. Respiratory: Denies shortness of breath.  No cough. Gastrointestinal: No abdominal pain.  No nausea, no vomiting.  No diarrhea.  No constipation. Genitourinary: Negative for dysuria. Musculoskeletal: Negative for back pain. Positive muscle cramps. Skin: Negative for rash. Neurological: Negative for headaches. No focal numbness, tingling or weakness.   10-point ROS otherwise negative.  ____________________________________________   PHYSICAL EXAM:  VITAL SIGNS: ED Triage Vitals  Enc Vitals Group     BP 02/19/16 1447 112/86 mmHg     Pulse Rate 02/19/16 1447 92     Resp 02/19/16 1447 18     Temp 02/19/16 1447 98.1 F (36.7 C)     Temp Source 02/19/16 1447 Oral     SpO2 02/19/16 1447 99 %     Weight 02/19/16 1445 174 lb (78.926 kg)     Height 02/19/16 1445 5\' 9"  (1.753 m)     Head Cir --      Peak Flow --      Pain Score 02/19/16 1445 5     Pain Loc --      Pain Edu? --      Excl. in GC? --     Constitutional:Patient is alert and oriented and answering questions appropriately. Intermittently, he has severe cramping, and I witnessed cramps in the right calf as well as the abdominal wall which last for 30-45 seconds.  Eyes: Conjunctivae are normal.  EOMI. No scleral icterus. Head: Atraumatic. Nose: No congestion/rhinnorhea. Mouth/Throat: Mucous membranes are moist.  Neck: No stridor.  Supple.  No JVD. Cardiovascular: Normal rate, regular rhythm. No murmurs, rubs or gallops.  Respiratory: Normal respiratory effort.  No accessory muscle use or retractions. Lungs CTAB.  No  wheezes, rales or ronchi. Gastrointestinal: Soft, nontender and nondistended.  No guarding or rebound.  No peritoneal signs. Musculoskeletal: No LE edema. See above for cramping even now. Neurologic:  A&Ox3.  Speech is clear.  Face and smile are symmetric.  EOMI.  Moves all extremities well. Skin:  Skin is warm, dry and intact. No rash noted. Psychiatric: Mood and affect are normal. Speech and behavior are normal.  Normal judgement.  ____________________________________________   LABS (all labs ordered are listed, but only abnormal results are displayed)  Labs Reviewed  BASIC METABOLIC PANEL - Abnormal; Notable for the following:    Sodium 128 (*)    Chloride 85 (*)    Glucose, Bld 107 (*)    BUN 24 (*)    Creatinine, Ser 2.92 (*)    Calcium 11.4 (*)    GFR calc non Af Amer 24 (*)    GFR calc Af Amer 28 (*)    Anion gap 21 (*)    All other components within normal limits  CBC - Abnormal; Notable for the following:    RBC 6.23 (*)    Hemoglobin 20.1 (*)    HCT 57.3 (*)    All other components within normal limits  URINALYSIS COMPLETEWITH MICROSCOPIC ([facility] ONLY)  CK   ____________________________________________  EKG  ED ECG REPORT I, Rockne Menghini, the attending physician, personally viewed and interpreted this ECG.   Date: 02/19/2016  EKG Time: 1448  Rate: 92  Rhythm: normal sinus rhythm  Axis: normal  Intervals:none  ST&T Change: No STEMI  ____________________________________________  RADIOLOGY  No results found.  ____________________________________________   PROCEDURES  Procedure(s) performed: None  Critical Care performed: No ____________________________________________   INITIAL IMPRESSION / ASSESSMENT AND PLAN / ED COURSE  Pertinent labs & imaging results that were available during my care of the patient were reviewed by me and considered in my medical decision making (see chart for details).  45 y.o. male with a history of HTN, HL,  hypokalemia onset potassium supplementation presenting with severe muscle cramps in multiple muscles.  Given that the patient works in the heat, I'm concerned about repeat hypokalemia he has not been replacing his losses properly. I will also plan to treat the patient with some Valium to try to decrease the frequency and severity of his cramping.  ----------------------------------------- 3:38 PM on 02/19/2016 ----------------------------------------- The patient does have a normal potassium today at 4.0. However he is markedly dehydrated with a creatinine of 2.92 and a BUN of 24. He is hyponatremic as well and his CBC appears hemoconcentrated with a hematocrit of 57.3 and a white blood cell count which is higher than his usual. I'll plan to admit the patient to the hospital for further treatment and evaluation.   ____________________________________________  FINAL CLINICAL IMPRESSION(S) / ED DIAGNOSES  Final diagnoses:  Hyponatremia  Acute renal insufficiency  Dehydration  Muscle cramping      NEW MEDICATIONS STARTED DURING THIS VISIT:  New Prescriptions   No medications on file     Rockne Menghini, MD 02/19/16 1541

## 2016-02-19 NOTE — ED Notes (Signed)
Pt c/o generalized muscle cramping since yesterday.. States he has a hx of hypokalemia..Marland Kitchen

## 2016-02-20 LAB — BASIC METABOLIC PANEL
Anion gap: 9 (ref 5–15)
BUN: 22 mg/dL — ABNORMAL HIGH (ref 6–20)
CO2: 24 mmol/L (ref 22–32)
Calcium: 8.9 mg/dL (ref 8.9–10.3)
Chloride: 100 mmol/L — ABNORMAL LOW (ref 101–111)
Creatinine, Ser: 1.17 mg/dL (ref 0.61–1.24)
GFR calc Af Amer: >60 mL/min (ref >60)
GFR calc non Af Amer: >60 mL/min (ref >60)
Glucose, Bld: 130 mg/dL — ABNORMAL HIGH (ref 65–99)
Potassium: 3.1 mmol/L — ABNORMAL LOW (ref 3.5–5.1)
Sodium: 133 mmol/L — ABNORMAL LOW (ref 135–145)

## 2016-02-20 LAB — CBC
HCT: 42.3 % (ref 40.0–52.0)
Hemoglobin: 15.1 g/dL (ref 13.0–18.0)
MCH: 33.5 pg (ref 26.0–34.0)
MCHC: 35.8 g/dL (ref 32.0–36.0)
MCV: 93.5 fL (ref 80.0–100.0)
Platelets: 178 10*3/uL (ref 150–440)
RBC: 4.52 MIL/uL (ref 4.40–5.90)
RDW: 13.5 % (ref 11.5–14.5)
WBC: 5.1 10*3/uL (ref 3.8–10.6)

## 2016-02-20 LAB — CK: Total CK: 1083 U/L — ABNORMAL HIGH (ref 49–397)

## 2016-02-20 MED ORDER — POTASSIUM CHLORIDE CRYS ER 20 MEQ PO TBCR
40.0000 meq | EXTENDED_RELEASE_TABLET | ORAL | Status: AC
  Administered 2016-02-20 (×2): 40 meq via ORAL
  Filled 2016-02-20 (×2): qty 2

## 2016-02-20 NOTE — Progress Notes (Signed)
[facility] [place] [facility]        [name] White was admitted to the Hospital on 02/19/2016 and Discharged  02/20/2016 and should be excused from work/school   for 5  days starting 02/19/2016 , may return to work/school without any restrictions.  Call [name] with questions.  [name] on 02/20/2016,at 9:29 AM  [facility] [place] at [facility]    Office  [phone]

## 2016-02-20 NOTE — Progress Notes (Signed)
Patient discharge teaching given, including activity, diet, follow-up appoints, and medications. Patient verbalized understanding of all discharge instructions. IV access and heart monitoring was d/c'd. Vitals are stable. Skin is intact except as charted in most recent assessments. Pt to be escorted out by wife, to be driven home by family.  [name] White

## 2016-02-20 NOTE — Care Management (Signed)
Self pay patient. Employed. [facility].  No new medications at discharge.  Medication Management and [facility] application provided at discharge.

## 2016-02-20 NOTE — Discharge Summary (Signed)
[facility] [place] at [facility]   PATIENT NAME: [name] White    MR#:  [id number]  DATE OF BIRTH:  03/16/1971  DATE OF ADMISSION:  02/19/2016 ADMITTING PHYSICIAN: Wyatt Haste, MD  DATE OF DISCHARGE: 02/20/2016  PRIMARY CARE PHYSICIAN: Baruch Gouty, MD    ADMISSION DIAGNOSIS:  Dehydration [E86.0] Hyponatremia [E87.1] Acute renal insufficiency [N28.9] Muscle cramping [R25.2]  DISCHARGE DIAGNOSIS:  Active Problems:   Acute renal failure (ARF) (HCC)   SECONDARY DIAGNOSIS:   Past Medical History  Diagnosis Date  . Arthritis   . Avascular necrosis (HCC)   . IFG (impaired fasting glucose)   . Hyperlipidemia   . Hypertension   . Depression   . Anxiety   . GERD (gastroesophageal reflux disease)   . Overweight   . History of alcohol abuse   . Alcohol abuse, in remission     HOSPITAL COURSE:   45 year old male with history of avascular necrosis and essential hypertension who presented with cramps and found to have acute kidney injury and rhabdomyolysis.  1. Acute kidney injury: This is secondary to dehydration and partly and rhabdomyolysis. Creatinine is improved with IV fluids.  2. Rhabdomyolysis: This is due to dehydration from being outdoors. Creatinine kinase has improved.  3. Essential hypertension: Continue metoprolol and Norvasc. Patient will need outpatient follow-up.  4. GERD: Continue Protonix.  DISCHARGE CONDITIONS AND DIET:   Discharge home in stable condition on a heart healthy diet  CONSULTS OBTAINED:  Treatment Team:  Wyatt Haste, MD  DRUG ALLERGIES:   Allergies  Allergen Reactions  . Ace Inhibitors Anaphylaxis  . Captopril Anaphylaxis  . Enalapril Anaphylaxis  . Fosinopril Anaphylaxis  . Lisinopril Anaphylaxis  . Ramipril Anaphylaxis    DISCHARGE MEDICATIONS:   Current Discharge Medication List    CONTINUE these medications which have NOT CHANGED   Details  amLODipine (NORVASC) 10 MG tablet Take 1 tablet (10  mg total) by mouth every morning. Qty: 30 tablet, Refills: 6    aspirin EC 81 MG tablet Take 81 mg by mouth daily.    metoprolol succinate (TOPROL-XL) 100 MG 24 hr tablet Take 1 tablet (100 mg total) by mouth daily. Take with or immediately following a meal. Qty: 30 tablet, Refills: 6    pantoprazole (PROTONIX) 40 MG tablet Take 1 tablet (40 mg total) by mouth daily. Qty: 30 tablet, Refills: 1    potassium chloride SA (K-DUR,KLOR-CON) 20 MEQ tablet Take 20 mEq by mouth 2 (two) times daily.    venlafaxine XR (EFFEXOR-XR) 37.5 MG 24 hr capsule Take 1 capsule (37.5 mg total) by mouth daily with breakfast. Qty: 30 capsule, Refills: 6              Today   CHIEF COMPLAINT:   Patient doing well this point. No cramps. Urinating well.   VITAL SIGNS:  Blood pressure 117/64, pulse 76, temperature 98.2 F (36.8 C), temperature source Oral, resp. rate 18, height  (1.753 m), weight 78.926 kg (174 lb), SpO2 99 %.   REVIEW OF SYSTEMS:  Review of Systems  Constitutional: Negative for fever, chills and malaise/fatigue.  HENT: Negative for ear discharge, ear pain, hearing loss, nosebleeds and sore throat.   Eyes: Negative for blurred vision and pain.  Respiratory: Negative for cough, hemoptysis, shortness of breath and wheezing.   Cardiovascular: Negative for chest pain, palpitations and leg swelling.  Gastrointestinal: Negative for nausea, vomiting, abdominal pain, diarrhea and blood in stool.  Genitourinary: Negative for dysuria.  Musculoskeletal:  Negative for back pain.  Neurological: Negative for dizziness, tremors, speech change, focal weakness, seizures and headaches.  Endo/Heme/Allergies: Does not bruise/bleed easily.  Psychiatric/Behavioral: Negative for depression, suicidal ideas and hallucinations.     PHYSICAL EXAMINATION:  GENERAL:  45 y.o.-year-old patient lying in the bed with no acute distress.  NECK:  Supple, no jugular venous distention. No thyroid  enlargement, no tenderness.  LUNGS: Normal breath sounds bilaterally, no wheezing, rales,rhonchi  No use of accessory muscles of respiration.  CARDIOVASCULAR: S1, S2 normal. No murmurs, rubs, or gallops.  ABDOMEN: Soft, non-tender, non-distended. Bowel sounds present. No organomegaly or mass.  EXTREMITIES: No pedal edema, cyanosis, or clubbing.  PSYCHIATRIC: The patient is alert and oriented x 3.  SKIN: No obvious rash, lesion, or ulcer.   DATA REVIEW:   CBC  Recent Labs Lab 02/20/16 0512  WBC 5.1  HGB 15.1  HCT 42.3  PLT 178    Chemistries   Recent Labs Lab 02/20/16 0512  NA 133*  K 3.1*  CL 100*  CO2 24  GLUCOSE 130*  BUN 22*  CREATININE 1.17  CALCIUM 8.9    Cardiac Enzymes No results for input(s): TROPONINI in the last 168 hours.  Microbiology Results  @MICRORSLT48 @  RADIOLOGY:  No results found.    Management plans discussed with the patient and he is in agreement. Stable for discharge home  Patient should follow up with PCP in1  week  CODE STATUS:     Code Status Orders        Start     Ordered   02/19/16 1622  Full code   Continuous     02/19/16 1622    Code Status History    Date Active Date Inactive Code Status Order ID Comments User Context   01/30/2014 12:02 PM 01/31/2014 12:53 PM Full Code [id number]  Shelda PalMatthew D Olin, MD Inpatient      TOTAL TIME TAKING CARE OF THIS PATIENT: 36 minutes.    Note: This dictation was prepared with Dragon dictation along with smaller phrase technology. Any transcriptional errors that result from this process are unintentional.  Ellina Sivertsen M.D on 02/20/2016 at 11:36 AM  Between 7am to 6pm - Pager - [phone] After 6pm go to [email or web address] - Social research officer, [email or web address] [email or web address]  [facility] [place] [facility]  Office  [phone]  CC: Primary care physician; Baruch GoutyMelinda [name], MD

## 2016-02-26 ENCOUNTER — Ambulatory Visit: Payer: 59 | Admitting: Family Medicine

## 2016-07-20 ENCOUNTER — Other Ambulatory Visit: Payer: Self-pay | Admitting: Family Medicine

## 2016-07-20 NOTE — Telephone Encounter (Signed)
Please find out [facility]; if he's coming here, he'll need an appt please (I can approve limited Rx); [facility], forward request to provider there please

## 2016-07-20 NOTE — Telephone Encounter (Signed)
Could you try his mother?

## 2016-07-20 NOTE — Telephone Encounter (Signed)
D/C

## 2016-07-22 ENCOUNTER — Telehealth: Payer: Self-pay | Admitting: Family Medicine

## 2016-07-22 NOTE — Telephone Encounter (Signed)
Pt took his last Metoprolol today. Pt states his wife lost her benefits and he will have insurance again on 08/09/2016. Pt requesting enough BP meds until he can get in. [facility] in [place]. Pt understands Dr Sherie DonLada is out of the office and wants a call back.

## 2016-07-23 MED ORDER — METOPROLOL SUCCINATE ER 100 MG PO TB24: 100.0000 mg | ORAL_TABLET | Freq: Every day | ORAL | 1 refills | Status: DC

## 2016-07-23 NOTE — Telephone Encounter (Signed)
I tried to call patient back, went straight to voicemail which was full.

## 2016-07-23 NOTE — Telephone Encounter (Signed)
I sent in refills I tried to return patient's call, but voice mailbox has not been set up yet Please let him know Rx was sent [facility] and we look forward to seeing him soon

## 2016-07-24 NOTE — Telephone Encounter (Signed)
Called Pt on work phone; Pt other number is no longer in service. Ask the pt is he wanted to update his contact info and he refused. PT notified that his RX was sent [facility].

## 2016-08-16 ENCOUNTER — Other Ambulatory Visit: Payer: Self-pay | Admitting: Family Medicine

## 2016-08-17 NOTE — Telephone Encounter (Signed)
Patient has not been seen at [facility] Please contact him; find out if staying at [facility], and if so, please forward Rx request there If he plans to see me here, he'll need an appt please, first available, and back to me for Rx; thank you

## 2016-08-17 NOTE — Telephone Encounter (Signed)
lmom to infom

## 2016-08-26 NOTE — Telephone Encounter (Signed)
completed

## 2016-09-04 NOTE — Telephone Encounter (Signed)
completed

## 2016-09-14 ENCOUNTER — Other Ambulatory Visit: Payer: Self-pay | Admitting: Family Medicine

## 2016-09-14 NOTE — Telephone Encounter (Signed)
Please see previous phone note from November; I allowed 30 days to get him in for an appointment; he does not yet have an upcoming appt I have not seen the patient since February and he has had two "no shows" in a row since our last visit I really must ask for an appointment if he plans to f/u here I'll send in 7 days to get his attention to call the office if you can't reach him, as he should not run out of this medicine abruptly Once he makes an appt, I'll fill for 30 days If he plans to stay at [facility], that's fine and we wish him well, but future refills will need to come from them Thank you

## 2016-09-14 NOTE — Telephone Encounter (Signed)
#   has been disconnted

## 2016-09-15 ENCOUNTER — Emergency Department
Admission: EM | Admit: 2016-09-15 | Discharge: 2016-09-15 | Disposition: A | Discharge status: Home / Self Care | Payer: Managed Care, Other (non HMO) | Attending: Emergency Medicine | Admitting: Emergency Medicine

## 2016-09-15 ENCOUNTER — Encounter: Payer: Self-pay | Admitting: Emergency Medicine

## 2016-09-15 DIAGNOSIS — F17210 Nicotine dependence, cigarettes, uncomplicated: Secondary | ICD-10-CM | POA: Diagnosis not present

## 2016-09-15 DIAGNOSIS — Z79899 Other long term (current) drug therapy: Secondary | ICD-10-CM | POA: Insufficient documentation

## 2016-09-15 DIAGNOSIS — Z7982 Long term (current) use of aspirin: Secondary | ICD-10-CM | POA: Diagnosis not present

## 2016-09-15 DIAGNOSIS — F1010 Alcohol abuse, uncomplicated: Secondary | ICD-10-CM | POA: Diagnosis present

## 2016-09-15 DIAGNOSIS — I10 Essential (primary) hypertension: Secondary | ICD-10-CM | POA: Insufficient documentation

## 2016-09-15 LAB — COMPREHENSIVE METABOLIC PANEL
ALT: 40 U/L (ref 17–63)
AST: 62 U/L — ABNORMAL HIGH (ref 15–41)
Albumin: 4.3 g/dL (ref 3.5–5.0)
Alkaline Phosphatase: 94 U/L (ref 38–126)
Anion gap: 16 — ABNORMAL HIGH (ref 5–15)
BUN: 8 mg/dL (ref 6–20)
CO2: 23 mmol/L (ref 22–32)
Calcium: 9.3 mg/dL (ref 8.9–10.3)
Chloride: 89 mmol/L — ABNORMAL LOW (ref 101–111)
Creatinine, Ser: 0.81 mg/dL (ref 0.61–1.24)
GFR calc Af Amer: >60 mL/min (ref >60)
GFR calc non Af Amer: >60 mL/min (ref >60)
Glucose, Bld: 126 mg/dL — ABNORMAL HIGH (ref 65–99)
Potassium: 3 mmol/L — ABNORMAL LOW (ref 3.5–5.1)
Sodium: 128 mmol/L — ABNORMAL LOW (ref 135–145)
Total Bilirubin: 1.4 mg/dL — ABNORMAL HIGH (ref 0.3–1.2)
Total Protein: 7.7 g/dL (ref 6.5–8.1)

## 2016-09-15 LAB — LIPASE, BLOOD: Lipase: 63 U/L — ABNORMAL HIGH (ref 11–51)

## 2016-09-15 LAB — CBC WITH DIFFERENTIAL/PLATELET
Basophils Absolute: 0 10*3/uL (ref 0–0.1)
Basophils Relative: 0 %
Eosinophils Absolute: 0 10*3/uL (ref 0–0.7)
Eosinophils Relative: 0 %
HCT: 51.1 % (ref 40.0–52.0)
Hemoglobin: 18.4 g/dL — ABNORMAL HIGH (ref 13.0–18.0)
Lymphocytes Relative: 15 %
Lymphs Abs: 1 10*3/uL (ref 1.0–3.6)
MCH: 33.1 pg (ref 26.0–34.0)
MCHC: 36 g/dL (ref 32.0–36.0)
MCV: 91.8 fL (ref 80.0–100.0)
Monocytes Absolute: 0.5 10*3/uL (ref 0.2–1.0)
Monocytes Relative: 7 %
Neutro Abs: 5.1 10*3/uL (ref 1.4–6.5)
Neutrophils Relative %: 78 %
Platelets: 203 10*3/uL (ref 150–440)
RBC: 5.57 MIL/uL (ref 4.40–5.90)
RDW: 15.6 % — ABNORMAL HIGH (ref 11.5–14.5)
WBC: 6.6 10*3/uL (ref 3.8–10.6)

## 2016-09-15 MED ORDER — LORAZEPAM 1 MG PO TABS: 1.0000 mg | ORAL_TABLET | Freq: Two times a day (BID) | ORAL | 0 refills | Status: DC | PRN

## 2016-09-15 MED ORDER — LORAZEPAM 2 MG/ML IJ SOLN
1.0000 mg | Freq: Once | INTRAMUSCULAR | Status: AC
  Administered 2016-09-15: 1 mg via INTRAVENOUS
  Filled 2016-09-15: qty 1

## 2016-09-15 MED ORDER — ONDANSETRON HCL 4 MG/2ML IJ SOLN
4.0000 mg | Freq: Once | INTRAMUSCULAR | Status: AC
  Administered 2016-09-15: 4 mg via INTRAVENOUS
  Filled 2016-09-15: qty 2

## 2016-09-15 MED ORDER — SODIUM CHLORIDE 0.9 % IV BOLUS (SEPSIS)
1000.0000 mL | Freq: Once | INTRAVENOUS | Status: AC
  Administered 2016-09-15: 1000 mL via INTRAVENOUS

## 2016-09-15 NOTE — BH Assessment (Signed)
Assessment Note  [name] White is an 45 y.o. male who presents to the ER seeking assistance with alcohol detox. Patient reports, for the last month he has drank, on a daily basis a "fifth and half of vodka." Prior to that, he was drinking only on the weekends and it was beers. His current symptoms of withdrawal are; shakes tremors, some nausea, cold chills, sweat, and headaches. In the past, he has had a seizure and it was approximately two years ago.  He states he have a "support system" and does not want to be inpatient or stay overnight at a detox facility. He was last inpatient approximately two years ago, with [facility]. Since then, he has done well until this month.  Patient denies SI/HI and AV/H. His wife was present during the interview, per patient requested. She reports of having no safety concerns and wanted him to return home as well.  Patient denies the use of any other mind-altering substances, no involvement with the legal system and none with DSS. During the interview, the patient was calm, cooperative and pleasant. It was very noticeable the patient was in active withdrawals and was shaking.  Writer offer the patient information for both inpatient detox facilities and for outpatient providers but patient declined the information.  Diagnosis: Alcohol Use Disorder, Severe  Past Medical History:  Past Medical History:  Diagnosis Date  . Alcohol abuse, in remission   . Anxiety   . Arthritis   . Avascular necrosis (HCC)   . Depression   . GERD (gastroesophageal reflux disease)   . History of alcohol abuse   . Hyperlipidemia   . Hypertension   . IFG (impaired fasting glucose)   . Overweight     Past Surgical History:  Procedure Laterality Date  . CARDIAC CATHETERIZATION N/A 01/02/2016   Procedure: Left Heart Cath and Coronary Angiography;  Surgeon: Marykay Lexavid W Harding, MD;  [facility];  Service: Cardiovascular;  Laterality: N/A;  . CARPAL TUNNEL  RELEASE Bilateral    right x 2, left x 1  . TESTICLE TORSION REDUCTION    . TOTAL HIP ARTHROPLASTY Left 01/30/2014   Procedure: LEFT TOTAL HIP ARTHROPLASTY ANTERIOR APPROACH;  Surgeon: Shelda PalMatthew D Olin, MD;  Location: [facility] OR[facility];  Service: Orthopedics;  Laterality: Left;  Marland Kitchen. VASECTOMY  1995  . VASECTOMY REVERSAL  1997    Family History:  Family History  Problem Relation Age of Onset  . Hypertension Mother   . Diabetes Mother   . Hypertension Father   . Stroke Father   . Cancer Neg Hx   . COPD Neg Hx   . Heart disease Neg Hx     Social History:  reports that he has been smoking Cigarettes.  He has a 30.00 pack-year smoking history. He has never used smokeless tobacco. He reports that he drinks alcohol. He reports that he does not use drugs.  Additional Social History:  Alcohol / Drug Use Pain Medications: See PTA Prescriptions: See PTA Over the Counter: See PTA History of alcohol / drug use?: Yes Longest period of sobriety (when/how long): Unable to quantify Negative Consequences of Use: Personal relationships, Work / Programmer, multimediachool, Surveyor, quantityinancial Withdrawal Symptoms: Nausea / Vomiting, Tremors, Fever / Chills, Sweats Substance #1 Name of Substance 1: Alcohol 1 - Age of First Use: 13 1 - Amount (size/oz): "fifth and half of vodka" 1 - Frequency: Daily 1 - Duration: This amount for a month. 1 - Last Use / Amount: 09/15/2016  CIWA:  CIWA-Ar BP: (!) 141/83 Pulse Rate: 90 Nausea and Vomiting: no nausea and no vomiting Tactile Disturbances: none Tremor: six Auditory Disturbances: not present Paroxysmal Sweats: no sweat visible Visual Disturbances: not present Anxiety: mildly anxious Headache, Fullness in Head: very mild Agitation: normal activity Orientation and Clouding of Sensorium: oriented and can do serial additions CIWA-Ar Total: 8 COWS:    Allergies:  Allergies  Allergen Reactions  . Ace Inhibitors Anaphylaxis  . Captopril Anaphylaxis  . Enalapril Anaphylaxis  . Fosinopril  Anaphylaxis  . Lisinopril Anaphylaxis  . Ramipril Anaphylaxis    Home Medications:  (Not in a hospital admission)  OB/GYN Status:  No LMP for male patient.  General Assessment Data Location of Assessment: [facility] ED TTS Assessment: In system Is this a Tele or Face-to-Face Assessment?: Face-to-Face Is this an Initial Assessment or a Re-assessment for this encounter?: Initial Assessment Marital status: Married [place] name: n/a Is patient pregnant?: No Pregnancy Status: No Living Arrangements: Spouse/significant other, Children Can pt return to current living arrangement?: Yes Admission Status: Voluntary Is patient capable of signing voluntary admission?: Yes Referral Source: Self/Family/Friend Insurance type: Product/process development scientist Exam [facility] Walk-in ONLY) Medical Exam completed: Yes  Crisis Care Plan Living Arrangements: Spouse/significant other, Children Legal Guardian: Other: (Self) Name of Psychiatrist: Reports of none Name of Therapist: Reports of none  Education Status Is patient currently in school?: No Current Grade: n/a Highest grade of school patient has completed: High School Diploma Name of school: n/a Contact person: n/a  Risk to self with the past 6 months Suicidal Ideation: No Has patient been a risk to self within the past 6 months prior to admission? : No Suicidal Intent: No Has patient had any suicidal intent within the past 6 months prior to admission? : No Is patient at risk for suicide?: No Suicidal Plan?: No Has patient had any suicidal plan within the past 6 months prior to admission? : No Access to Means: No What has been your use of drugs/alcohol within the last 12 months?: Alcohol Previous Attempts/Gestures: No Other Self Harm Risks: Active Addiction Triggers for Past Attempts: None known Intentional Self Injurious Behavior: None Family Suicide History: No Recent stressful life event(s): Other (Comment) (Active Addiction) Persecutory  voices/beliefs?: No Depression: No Depression Symptoms: Feeling worthless/self pity Substance abuse history and/or treatment for substance abuse?: Yes Suicide prevention information given to non-admitted patients: Not applicable  Risk to Others within the past 6 months Homicidal Ideation: No Does patient have any lifetime risk of violence toward others beyond the six months prior to admission? : No Thoughts of Harm to Others: No Current Homicidal Intent: No Current Homicidal Plan: No Access to Homicidal Means: No Identified Victim: Reports of none History of harm to others?: No Assessment of Violence: None Noted Violent Behavior Description: Reports of none Does patient have access to weapons?: No Criminal Charges Pending?: No Does patient have a court date: No Is patient on probation?: No  Psychosis Hallucinations: None noted Delusions: None noted  Mental Status Report Appearance/Hygiene: In scrubs, Unremarkable Eye Contact: Fair Motor Activity: Freedom of movement, Unremarkable Speech: Logical/coherent, Slow, Unremarkable Level of Consciousness: Alert Mood: Depressed, Pleasant Affect: Appropriate to circumstance, Sad Anxiety Level: Minimal Thought Processes: Coherent, Relevant Judgement: Partial Orientation: Person, Place, Time, Situation, Appropriate for developmental age Obsessive Compulsive Thoughts/Behaviors: Minimal  Cognitive Functioning Concentration: Normal Memory: Recent Intact, Remote Intact IQ: Average Insight: Fair Impulse Control: Poor Appetite: Fair Weight Loss: 0 Weight Gain: 0 Sleep: Decreased (Have to drink alcohol to go  to sleep.) Total Hours of Sleep: 5 Vegetative Symptoms: None  ADLScreening [facility] Assessment Services) Patient's cognitive ability adequate to safely complete daily activities?: Yes Patient able to express need for assistance with ADLs?: Yes Independently performs ADLs?: Yes (appropriate for developmental age)  Prior  Inpatient Therapy Prior Inpatient Therapy: Yes Prior Therapy Dates: 2015 Prior [facility]ilty[facility] Reason for Treatment: Substance Abuse Treatment (Alcohol)  Prior Outpatient Therapy Prior Outpatient Therapy: No Prior Therapy Dates: Reports of none Prior Therapy Facilty/Provider(s): Reports of none Reason for Treatment: Reports of none Does patient have an ACCT team?: No Does patient have Intensive In-House Services?  : No Does patient have [facility] services? : No Does patient have P4CC services?: No  ADL Screening (condition at time of admission) Patient's cognitive ability adequate to safely complete daily activities?: Yes Is the patient deaf or have difficulty hearing?: No Does the patient have difficulty seeing, even when wearing glasses/contacts?: No Does the patient have difficulty concentrating, remembering, or making decisions?: No Patient able to express need for assistance with ADLs?: Yes Does the patient have difficulty dressing or bathing?: No Independently performs ADLs?: Yes (appropriate for developmental age) Does the patient have difficulty walking or climbing stairs?: No Weakness of Legs: None Weakness of Arms/Hands: None  Home Assistive Devices/Equipment Home Assistive Devices/Equipment: None  Therapy Consults (therapy consults require a physician order) PT Evaluation Needed: No OT Evalulation Needed: No SLP Evaluation Needed: No Abuse/Neglect Assessment (Assessment to be complete while patient is alone) Physical Abuse: Denies Verbal Abuse: Denies Sexual Abuse: Denies Exploitation of patient/patient's resources: Denies Self-Neglect: Denies Values / Beliefs Cultural Requests During Hospitalization: None Spiritual Requests During Hospitalization: None Consults Spiritual Care Consult Needed: No Social Work Consult Needed: No      Additional Information 1:1 In Past 12 Months?: No CIRT Risk: No Elopement Risk: No Does  patient have medical clearance?: Yes  Child/Adolescent Assessment Running Away Risk: Denies (Patient is an adult)  Disposition:  Disposition Initial Assessment Completed for this Encounter: Yes Disposition of Patient: Outpatient treatment Type of outpatient treatment: Chemical Dependence - Intensive Outpatient  On Site Evaluation by:   Reviewed with Physician:    Lilyan Gilfordalvin J. Samanthajo Payano MS, LCAS, LPC, NCC, CCSI Therapeutic Triage Specialist 09/15/2016 7:15 PM

## 2016-09-15 NOTE — ED Triage Notes (Addendum)
Pt to ed with c/o wanting detox.  Pt states last dose was at 430am today.  Pt states he drinks about 1/2 gallon of alcohol per day.  Pt vomiting at triage. Pt denies SI, denies HI.

## 2016-09-15 NOTE — Discharge Instructions (Signed)
Please return immediately if condition worsens. Please contact her primary physician or the physician you were given for referral. If you have any specialist physicians involved in her treatment and plan please also contact them. Thank you for using [facility] regional emergency Department. ° °

## 2016-09-15 NOTE — ED Provider Notes (Signed)
Time Seen: Approximately *0851  I have reviewed the triage notes  Chief Complaint: Alcohol Problem   History of Present Illness: [name] White is a 45 y.o. male *who presents with a long history of alcohol abuse and states he drinks approximately half a gallon of alcohol per day. Patient states the last time he drank was approximately 4:30 this morning. He denies any illicit drug usage and is here for alcohol treatment program as he feels "" jittery"" and "" shaky "". He denies any hallucinations. He denies any fever or chills chest or abdominal pain.   Past Medical History:  Diagnosis Date  . Alcohol abuse, in remission   . Anxiety   . Arthritis   . Avascular necrosis (HCC)   . Depression   . GERD (gastroesophageal reflux disease)   . History of alcohol abuse   . Hyperlipidemia   . Hypertension   . IFG (impaired fasting glucose)   . Overweight     Patient Active Problem List   Diagnosis Date Noted  . Acute renal failure (ARF) (HCC) 02/19/2016  . Chest pain, unspecified   . Angina, class II (HCC) 01/01/2016  . Chest pain 12/31/2015  . Abnormal nuclear stress test 12/31/2015  . Acute epigastric pain 11/06/2015  . Snoring 11/06/2015  . Elevated liver function tests 05/19/2015  . Fatty liver 05/19/2015  . Elevated serum GGT level 05/17/2015  . IFG (impaired fasting glucose)   . Hyperlipidemia   . Essential hypertension   . Depression   . Anxiety   . GERD (gastroesophageal reflux disease)   . Overweight   . Alcohol abuse, episodic   . 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 CARDIAC CATHETERIZATION N/A 01/02/2016   Procedure: Left Heart Cath and Coronary Angiography;  Surgeon: Marykay Lexavid W Harding, MD;  [facility];  Service: Cardiovascular;  Laterality: N/A;  . CARPAL TUNNEL RELEASE Bilateral    right x 2, left x 1  . TESTICLE TORSION REDUCTION    . TOTAL HIP ARTHROPLASTY Left  01/30/2014   Procedure: LEFT TOTAL HIP ARTHROPLASTY ANTERIOR APPROACH;  Surgeon: Shelda PalMatthew D Olin, MD;  Location: [facility]S;  Service: Orthopedics;  Laterality: Left;  Marland Kitchen. VASECTOMY  1995  . VASECTOMY REVERSAL  1997    Past Surgical History:  Procedure Laterality Date  . CARDIAC CATHETERIZATION N/A 01/02/2016   Procedure: Left Heart Cath and Coronary Angiography;  Surgeon: Marykay Lexavid W Harding, MD;  [facility];  Service: Cardiovascular;  Laterality: N/A;  . CARPAL TUNNEL RELEASE Bilateral    right x 2, left x 1  . TESTICLE TORSION REDUCTION    . TOTAL HIP ARTHROPLASTY Left 01/30/2014   Procedure: LEFT TOTAL HIP ARTHROPLASTY ANTERIOR APPROACH;  Surgeon: Shelda PalMatthew D Olin, MD;  Location: [facility];  Service: Orthopedics;  Laterality: Left;  Marland Kitchen. VASECTOMY  1995  . VASECTOMY REVERSAL  1997    Current Outpatient Rx  . Order #: [id number] Class: Normal  . Order #: [id number] Class: Historical Med  . Order #: [id number] Class: Print  . Order #: [id number] Class: Normal  . Order #: [id number] Class: Normal  . Order #: [id number] Class: Historical Med  . Order #: [id number] Class: Normal    Allergies:  Ace inhibitors; Captopril; Enalapril; Fosinopril; Lisinopril; and Ramipril  Family History: Family History  Problem Relation Age of Onset  . Hypertension Mother   . Diabetes Mother   . Hypertension Father   . Stroke Father   .  Cancer Neg Hx   . COPD Neg Hx   . Heart disease Neg Hx     Social History: Social History  Substance Use Topics  . Smoking status: Current Some Day Smoker    Packs/day: 1.00    Years: 30.00    Types: Cigarettes  . Smokeless tobacco: Never Used  . Alcohol use Yes     Review of Systems:   10 point review of systems was performed and was otherwise negative:  Constitutional: No fever Eyes: No visual disturbances ENT: No sore throat, ear pain Cardiac: No chest pain Respiratory: No shortness of breath, wheezing, or stridor Abdomen: No abdominal pain, no vomiting,  No diarrhea Endocrine: No weight loss, No night sweats Extremities: No peripheral edema, cyanosis Skin: No rashes, easy bruising Neurologic: No focal weakness, trouble with speech or swollowing Urologic: No dysuria, Hematuria, or urinary frequency   Physical Exam:  ED Triage Vitals  Enc Vitals Group     BP 09/15/16 0811 (!) 133/92     Pulse Rate 09/15/16 0811 78     Resp 09/15/16 0811 20     Temp 09/15/16 0811 97.9 F (36.6 C)     Temp Source 09/15/16 0811 Oral     SpO2 09/15/16 0811 97 %     Weight 09/15/16 0812 174 lb (78.9 kg)     Height 09/15/16 0812 5\' 9"  (1.753 m)     Head Circumference --      Peak Flow --      Pain Score 09/15/16 0812 0     Pain Loc --      Pain Edu? --      Excl. in GC? --     General: Awake , Alert , and Oriented times 3; GCS 15 Head: Normal cephalic , atraumatic Eyes: Pupils equal , round, reactive to light Nose/Throat: No nasal drainage, patent upper airway without erythema or exudate.  Neck: Supple, Full range of motion, No anterior adenopathy or palpable thyroid masses Lungs: Clear to ascultation without wheezes , rhonchi, or rales Heart: Regular rate, regular rhythm without murmurs , gallops , or rubs Abdomen: Soft, non tender without rebound, guarding , or rigidity; bowel sounds positive and symmetric in all 4 quadrants. No organomegaly .        Extremities: 2 plus symmetric pulses. No edema, clubbing or cyanosis Neurologic: Occasional spasm-type movement without rhythmic seizure activity normal ambulation, Motor symmetric without deficits, sensory intact Skin: warm, dry, no rashes   Labs:   All laboratory work was reviewed including any pertinent negatives or positives listed below:  Labs Reviewed  COMPREHENSIVE METABOLIC PANEL - Abnormal; Notable for the following:       Result Value   Sodium 128 (*)    Potassium 3.0 (*)    Chloride 89 (*)    Glucose, Bld 126 (*)    AST 62 (*)    Total Bilirubin 1.4 (*)    Anion gap 16 (*)     All other components within normal limits  LIPASE, BLOOD - Abnormal; Notable for the following:    Lipase 63 (*)    All other components within normal limits  CBC WITH DIFFERENTIAL/PLATELET - Abnormal; Notable for the following:    Hemoglobin 18.4 (*)    RDW 15.6 (*)    All other components within normal limits  Electrolytes are somewhat abnormal. Patient was given IV fluids here along with a meal   ED Course:  Patient's stay here was uneventful and the patient remained  hemodynamically stable. He was seen by TTS and was given options for outpatient alcohol treatment program. Patient's shaking resolved and he was given a prescription for Ativan. I felt the patient's low potassium of correct itself upon advancement of his diet. Clinical Course      Assessment: * Alcohol abuse   Final Clinical Impression: *  Final diagnoses:  Alcohol abuse     Plan:  Outpatient " Discharge Medication List as of 09/15/2016  1:30 PM    START taking these medications   Details  LORazepam (ATIVAN) 1 MG tablet Take 1 tablet (1 mg total) by mouth 2 (two) times daily as needed for anxiety., Starting Tue 09/15/2016, Print      " Patient was advised to return immediately if condition worsens. Patient was advised to follow up with their primary care physician or other specialized physicians involved in their outpatient care. The patient and/or family member/power of attorney had laboratory results reviewed at the bedside. All questions and concerns were addressed and appropriate discharge instructions were distributed by the nursing staff.             Jennye MoccasinBrian S Savanah Bayles, MD 09/15/16 [phone]

## 2016-09-15 NOTE — ED Notes (Signed)
Teleassessment services called to inquire about TTS consult; was told that they were only covering until 12 for us and that Jerilynn SomCalvin should be here.  Unable to get in touch with Calvin at this time.  Asked teleassessment services to complete consult given that the order was put in at 0844; Page w/ teleassessment states she will look into it and call me back.

## 2016-09-22 ENCOUNTER — Other Ambulatory Visit: Payer: Self-pay | Admitting: Family Medicine

## 2016-10-06 NOTE — Telephone Encounter (Signed)
Glitch in computer system; I was not getting refill requests; addressing today ------------------------ Please contact patient See previous phone notes (October, November) I really need to see the patient please; his last visit was Nov 06, 2015 Please ask him to schedule a visit to be seen in the next 30 days I'll approve this last Rx, but will need to see him for continued care (two no shows in a row since last seen) We really want to help him, but need to see him; thank you

## 2016-10-07 NOTE — Telephone Encounter (Signed)
LMOM to inform pt to call office to schedule an appt °

## 2017-01-14 ENCOUNTER — Encounter: Payer: Self-pay | Admitting: Family Medicine

## 2017-01-14 ENCOUNTER — Ambulatory Visit (INDEPENDENT_AMBULATORY_CARE_PROVIDER_SITE_OTHER): Payer: Managed Care, Other (non HMO) | Admitting: Family Medicine

## 2017-01-14 VITALS — BP 168/98 | HR 80 | Temp 98.1°F | Resp 16 | Wt 177.7 lb

## 2017-01-14 DIAGNOSIS — F419 Anxiety disorder, unspecified: Secondary | ICD-10-CM

## 2017-01-14 DIAGNOSIS — K219 Gastro-esophageal reflux disease without esophagitis: Secondary | ICD-10-CM | POA: Diagnosis not present

## 2017-01-14 DIAGNOSIS — K760 Fatty (change of) liver, not elsewhere classified: Secondary | ICD-10-CM | POA: Diagnosis not present

## 2017-01-14 DIAGNOSIS — I10 Essential (primary) hypertension: Secondary | ICD-10-CM

## 2017-01-14 DIAGNOSIS — Z5181 Encounter for therapeutic drug level monitoring: Secondary | ICD-10-CM

## 2017-01-14 DIAGNOSIS — R7301 Impaired fasting glucose: Secondary | ICD-10-CM

## 2017-01-14 DIAGNOSIS — R945 Abnormal results of liver function studies: Secondary | ICD-10-CM

## 2017-01-14 DIAGNOSIS — F3341 Major depressive disorder, recurrent, in partial remission: Secondary | ICD-10-CM

## 2017-01-14 DIAGNOSIS — E782 Mixed hyperlipidemia: Secondary | ICD-10-CM

## 2017-01-14 DIAGNOSIS — F1010 Alcohol abuse, uncomplicated: Secondary | ICD-10-CM

## 2017-01-14 DIAGNOSIS — R0683 Snoring: Secondary | ICD-10-CM

## 2017-01-14 DIAGNOSIS — H9202 Otalgia, left ear: Secondary | ICD-10-CM

## 2017-01-14 DIAGNOSIS — R7989 Other specified abnormal findings of blood chemistry: Secondary | ICD-10-CM

## 2017-01-14 LAB — CBC WITH DIFFERENTIAL/PLATELET
Basophils Absolute: 60 cells/uL (ref 0–200)
Basophils Relative: 1 %
Eosinophils Absolute: 180 cells/uL (ref 15–500)
Eosinophils Relative: 3 %
HCT: 45.9 % (ref 38.5–50.0)
Hemoglobin: 15.9 g/dL (ref 13.2–17.1)
Lymphocytes Relative: 27 %
Lymphs Abs: 1620 cells/uL (ref 850–3900)
MCH: 31.4 pg (ref 27.0–33.0)
MCHC: 34.6 g/dL (ref 32.0–36.0)
MCV: 90.7 fL (ref 80.0–100.0)
MPV: 10.1 fL (ref 7.5–12.5)
Monocytes Absolute: 360 cells/uL (ref 200–950)
Monocytes Relative: 6 %
Neutro Abs: 3780 cells/uL (ref 1500–7800)
Neutrophils Relative %: 63 %
Platelets: 178 10*3/uL (ref 140–400)
RBC: 5.06 MIL/uL (ref 4.20–5.80)
RDW: 15.5 % — ABNORMAL HIGH (ref 11.0–15.0)
WBC: 6 10*3/uL (ref 3.8–10.8)

## 2017-01-14 LAB — COMPLETE METABOLIC PANEL WITH GFR
ALT: 21 U/L (ref 9–46)
AST: 27 U/L (ref 10–40)
Albumin: 4.6 g/dL (ref 3.6–5.1)
Alkaline Phosphatase: 87 U/L (ref 40–115)
BUN: 10 mg/dL (ref 7–25)
CO2: 24 mmol/L (ref 20–31)
Calcium: 9.5 mg/dL (ref 8.6–10.3)
Chloride: 104 mmol/L (ref 98–110)
Creat: 0.73 mg/dL (ref 0.60–1.35)
GFR, Est African American: >89 mL/min (ref >=60)
GFR, Est Non African American: >89 mL/min (ref >=60)
Glucose, Bld: 89 mg/dL (ref 65–99)
Potassium: 3.9 mmol/L (ref 3.5–5.3)
Sodium: 140 mmol/L (ref 135–146)
Total Bilirubin: 0.4 mg/dL (ref 0.2–1.2)
Total Protein: 7.2 g/dL (ref 6.1–8.1)

## 2017-01-14 LAB — LIPID PANEL
Cholesterol: 219 mg/dL — ABNORMAL HIGH (ref <200)
HDL: 71 mg/dL (ref >40)
LDL Cholesterol: 106 mg/dL — ABNORMAL HIGH (ref <100)
Total CHOL/HDL Ratio: 3.1 Ratio (ref <5.0)
Triglycerides: 210 mg/dL — ABNORMAL HIGH (ref <150)
VLDL: 42 mg/dL — ABNORMAL HIGH (ref <30)

## 2017-01-14 MED ORDER — VENLAFAXINE HCL ER 37.5 MG PO CP24: 37.5000 mg | ORAL_CAPSULE | Freq: Every day | ORAL | 6 refills | Status: DC

## 2017-01-14 MED ORDER — AMLODIPINE BESYLATE 10 MG PO TABS: 10.0000 mg | ORAL_TABLET | Freq: Every morning | ORAL | 6 refills | Status: DC

## 2017-01-14 MED ORDER — METOPROLOL SUCCINATE ER 100 MG PO TB24: 100.0000 mg | ORAL_TABLET | Freq: Every day | ORAL | 6 refills | Status: DC

## 2017-01-14 MED ORDER — PANTOPRAZOLE SODIUM 40 MG PO TBEC: 40.0000 mg | DELAYED_RELEASE_TABLET | Freq: Every day | ORAL | 6 refills | Status: DC

## 2017-01-14 NOTE — Assessment & Plan Note (Signed)
Not controlled; patient has been off of CCB, has been under significant stress with death of ex-wife, and has been taking decongestants and NSAIDs; will start back on the CCB, encouraged stress reduction, and STOP decongestants and NSAIDs; try DASH guidelines; pt to monitor his BP and notify me if SBP not under 140; close f/u encouraged

## 2017-01-14 NOTE — Assessment & Plan Note (Signed)
PPI, avoidance of NSAIDs, alcohol

## 2017-01-14 NOTE — Assessment & Plan Note (Signed)
Patient binge drinks occasionally; will check LFTs

## 2017-01-14 NOTE — Patient Instructions (Addendum)
Your goal blood pressure is less than 140 mmHg on top. Try to follow the DASH guidelines (DASH stands for Dietary Approaches to Stop Hypertension) Try to limit the sodium in your diet.  Ideally, consume less than 1.5 grams (less than 1,500mg ) per day. Do not add salt when cooking or at the table.  Check the sodium amount on labels when shopping, and choose items lower in sodium when given a choice. Avoid or limit foods that already contain a lot of sodium. Eat a diet rich in fruits and vegetables and whole grains. Continue the metoprolol and add 10 mg of amlodipine a day Contact me if top number not at goal in 5 days Try to use PLAIN allergy medicine without the decongestant Avoid: phenylephrine, phenylpropanolamine, and pseudoephredine Plain benadryl is safe for sleep for blood pressure If you need something for aches or pains, try to use Tylenol (acetaminophen) instead of non-steroidals (which include Aleve, ibuprofen, Advil, Motrin, and naproxen); non-steroidals can cause long-term kidney damage  DASH Eating Plan DASH stands for "Dietary Approaches to Stop Hypertension." The DASH eating plan is a healthy eating plan that has been shown to reduce high blood pressure (hypertension). It may also reduce your risk for type 2 diabetes, heart disease, and stroke. The DASH eating plan may also help with weight loss. What are tips for following this plan? General guidelines   Avoid eating more than 2,300 mg (milligrams) of salt (sodium) a day. If you have hypertension, you may need to reduce your sodium intake to 1,500 mg a day.  Limit alcohol intake to no more than 1 drink a day for nonpregnant women and 2 drinks a day for men. One drink equals 12 oz of beer, 5 oz of wine, or 1 oz of hard liquor.  Work with your health care provider to maintain a healthy body weight or to lose weight. Ask what an ideal weight is for you.  Get at least 30 minutes of exercise that causes your heart to beat faster  (aerobic exercise) most days of the week. Activities may include walking, swimming, or biking.  Work with your health care provider or diet and nutrition specialist (dietitian) to adjust your eating plan to your individual calorie needs. Reading food labels   Check food labels for the amount of sodium per serving. Choose foods with less than 5 percent of the Daily Value of sodium. Generally, foods with less than 300 mg of sodium per serving fit into this eating plan.  To find whole grains, look for the word "whole" as the first word in the ingredient list. Shopping   Buy products labeled as "low-sodium" or "no salt added."  Buy fresh foods. Avoid canned foods and premade or frozen meals. Cooking   Avoid adding salt when cooking. Use salt-free seasonings or herbs instead of table salt or sea salt. Check with your health care provider or pharmacist before using salt substitutes.  Do not fry foods. Cook foods using healthy methods such as baking, boiling, grilling, and broiling instead.  Cook with heart-healthy oils, such as olive, canola, soybean, or sunflower oil. Meal planning    Eat a balanced diet that includes:  5 or more servings of fruits and vegetables each day. At each meal, try to fill half of your plate with fruits and vegetables.  Up to 6-8 servings of whole grains each day.  Less than 6 oz of lean meat, poultry, or fish each day. A 3-oz serving of meat is about the same  size as a deck of cards. One egg equals 1 oz.  2 servings of low-fat dairy each day.  A serving of nuts, seeds, or beans 5 times each week.  Heart-healthy fats. Healthy fats called Omega-3 fatty acids are found in foods such as flaxseeds and coldwater fish, like sardines, salmon, and mackerel.  Limit how much you eat of the following:  Canned or prepackaged foods.  Food that is high in trans fat, such as fried foods.  Food that is high in saturated fat, such as fatty meat.  Sweets, desserts,  sugary drinks, and other foods with added sugar.  Full-fat dairy products.  Do not salt foods before eating.  Try to eat at least 2 vegetarian meals each week.  Eat more home-cooked food and less restaurant, buffet, and fast food.  When eating at a restaurant, ask that your food be prepared with less salt or no salt, if possible. What foods are recommended? The items listed may not be a complete list. Talk with your dietitian about what dietary choices are best for you. Grains  Whole-grain or whole-wheat bread. Whole-grain or whole-wheat pasta. Brown rice. Orpah Cobb. Bulgur. Whole-grain and low-sodium cereals. Pita bread. Low-fat, low-sodium crackers. Whole-wheat flour tortillas. Vegetables  Fresh or frozen vegetables (raw, steamed, roasted, or grilled). Low-sodium or reduced-sodium tomato and vegetable juice. Low-sodium or reduced-sodium tomato sauce and tomato paste. Low-sodium or reduced-sodium canned vegetables. Fruits  All fresh, dried, or frozen fruit. Canned fruit in natural juice (without added sugar). Meat and other protein foods  Skinless chicken or [place]. Ground chicken or [place]. Pork with fat trimmed off. Fish and seafood. Egg whites. Dried beans, peas, or lentils. Unsalted nuts, nut butters, and seeds. Unsalted canned beans. Lean cuts of beef with fat trimmed off. Low-sodium, lean deli meat. Dairy  Low-fat (1%) or fat-free (skim) milk. Fat-free, low-fat, or reduced-fat cheeses. Nonfat, low-sodium ricotta or cottage cheese. Low-fat or nonfat yogurt. Low-fat, low-sodium cheese. Fats and oils  Soft margarine without trans fats. Vegetable oil. Low-fat, reduced-fat, or light mayonnaise and salad dressings (reduced-sodium). Canola, safflower, olive, soybean, and sunflower oils. Avocado. Seasoning and other foods  Herbs. Spices. Seasoning mixes without salt. Unsalted popcorn and pretzels. Fat-free sweets. What foods are not recommended? The items listed may not be a complete  list. Talk with your dietitian about what dietary choices are best for you. Grains  Baked goods made with fat, such as croissants, muffins, or some breads. Dry pasta or rice meal packs. Vegetables  Creamed or fried vegetables. Vegetables in a cheese sauce. Regular canned vegetables (not low-sodium or reduced-sodium). Regular canned tomato sauce and paste (not low-sodium or reduced-sodium). Regular tomato and vegetable juice (not low-sodium or reduced-sodium). Rosita Fire. Olives. Fruits  Canned fruit in a light or heavy syrup. Fried fruit. Fruit in cream or butter sauce. Meat and other protein foods  Fatty cuts of meat. Ribs. Fried meat. Tomasa Blase. Sausage. Bologna and other processed lunch meats. Salami. Fatback. Hotdogs. Bratwurst. Salted nuts and seeds. Canned beans with added salt. Canned or smoked fish. Whole eggs or egg yolks. Chicken or [place] with skin. Dairy  Whole or 2% milk, cream, and half-and-half. Whole or full-fat cream cheese. Whole-fat or sweetened yogurt. Full-fat cheese. Nondairy creamers. Whipped toppings. Processed cheese and cheese spreads. Fats and oils  Butter. Stick margarine. Lard. Shortening. Ghee. Bacon fat. Tropical oils, such as coconut, palm kernel, or palm oil. Seasoning and other foods  Salted popcorn and pretzels. Onion salt, garlic salt, seasoned salt, table salt, and  sea salt. Worcestershire sauce. Tartar sauce. Barbecue sauce. Teriyaki sauce. Soy sauce, including reduced-sodium. Steak sauce. Canned and packaged gravies. Fish sauce. Oyster sauce. Cocktail sauce. Horseradish that you find on the shelf. Ketchup. Mustard. Meat flavorings and tenderizers. Bouillon cubes. Hot sauce and Tabasco sauce. Premade or packaged marinades. Premade or packaged taco seasonings. Relishes. Regular salad dressings. Where to find more information:  National Heart, Lung, and Blood Institute: PopSteam.is  American Heart Association: www.heart.org Summary  The DASH eating plan is a  healthy eating plan that has been shown to reduce high blood pressure (hypertension). It may also reduce your risk for type 2 diabetes, heart disease, and stroke.  With the DASH eating plan, you should limit salt (sodium) intake to 2,300 mg a day. If you have hypertension, you may need to reduce your sodium intake to 1,500 mg a day.  When on the DASH eating plan, aim to eat more fresh fruits and vegetables, whole grains, lean proteins, low-fat dairy, and heart-healthy fats.  Work with your health care provider or diet and nutrition specialist (dietitian) to adjust your eating plan to your individual calorie needs. This information is not intended to replace advice given to you by your health care provider. Make sure you discuss any questions you have with your health care provider. Document Released: 09/10/2011 Document Revised: 09/14/2016 Document Reviewed: 09/14/2016 Elsevier Interactive Patient Education  2017 [employer].

## 2017-01-14 NOTE — Assessment & Plan Note (Addendum)
I explained that sleep apnea can exacerbate HTN and can lead to CHF; recommended sleep evalu, but patient politely declined my recommendation for sleep evaluation to r/o OSA, open invitation on the chart if he changes his mind

## 2017-01-14 NOTE — Assessment & Plan Note (Signed)
Hx of binge drinking; recheck labs

## 2017-01-14 NOTE — Assessment & Plan Note (Signed)
Check lipids today 

## 2017-01-14 NOTE — Assessment & Plan Note (Signed)
Check glucose and A1c 

## 2017-01-14 NOTE — Assessment & Plan Note (Signed)
Supportive listening with loss of ex-wife; patient wishes to continue SNRI at current dose; I don't think dose is high enough to impact his BP

## 2017-01-14 NOTE — Progress Notes (Signed)
BP (!) 168/98   Pulse 80   Temp 98.1 F (36.7 C) (Oral)   Resp 16   Wt 177 lb 11.2 oz (80.6 kg)   SpO2 97%   BMI 26.24 kg/m    Subjective:    Patient ID: [name] White, male    DOB: [birth year], 46 y.o.   MRN: [id number]  HPI: [name] White is a 46 y.o. male  Chief Complaint  Patient presents with  . Follow-up  . Medication Refill    Hypertension  This is a chronic problem. The current episode started more than 1 year ago. The problem is unchanged. The problem is uncontrolled. Associated symptoms include headaches. Pertinent negatives include no chest pain. Agents associated with hypertension include decongestants. There are no compliance problems.   (there are actually compliance issues; he is only taking the beta-blocker; quit taking the CCB; has not been able to make appointments)  Still drinking from time to time, binge drinking every few months  Hard time concentrating; taking vitamin B12 and C every day; taking potassium  GERD; uses protonix; uses nexium every once a while; that helps  Depression screen [facility] 2/9 01/14/2017 11/06/2015 05/17/2015  Decreased Interest 0 0 1  Down, Depressed, Hopeless 1 1 0  PHQ - 2 Score Relevant past medical, surgical, family and social history reviewed Past Medical History:  Diagnosis Date  . Alcohol abuse, in remission   . Anxiety   . Arthritis   . Avascular necrosis (HCC)   . Depression   . GERD (gastroesophageal reflux disease)   . History of alcohol abuse   . Hyperlipidemia   . Hypertension   . IFG (impaired fasting glucose)   . Overweight    Past Surgical History:  Procedure Laterality Date  . CARDIAC CATHETERIZATION N/A 01/02/2016   Procedure: Left Heart Cath and Coronary Angiography;  Surgeon: Marykay Lex, MD;  Location: [facility] INVASIVE CV LAB;  Service: Cardiovascular;  Laterality: N/A;  . CARPAL TUNNEL RELEASE Bilateral    right x 2, left x 1  . TESTICLE TORSION REDUCTION    . TOTAL HIP ARTHROPLASTY  Left 01/30/2014   Procedure: LEFT TOTAL HIP ARTHROPLASTY ANTERIOR APPROACH;  Surgeon: Shelda Pal, MD;  Location: [facility] ORS;  Service: Orthopedics;  Laterality: Left;  Marland Kitchen VASECTOMY  1995  . VASECTOMY REVERSAL  1997   Family History  Problem Relation Age of Onset  . Hypertension Mother   . Diabetes Mother   . Hypertension Father   . Stroke Father   . Cancer Neg Hx   . COPD Neg Hx   . Heart disease Neg Hx    Social History  Substance Use Topics  . Smoking status: Current Some Day Smoker    Packs/day: 1.00    Years: 30.00    Types: Cigarettes  . Smokeless tobacco: Never Used  . Alcohol use Yes   Interim medical history since last visit reviewed. Allergies and medications reviewed  Review of Systems  HENT: Positive for ear pain (LEFT).   Cardiovascular: Negative for chest pain.  Neurological: Positive for headaches.   Per HPI unless specifically indicated above     Objective:    BP (!) 168/98   Pulse 80   Temp 98.1 F (36.7 C) (Oral)   Resp 16   Wt 177 lb 11.2 oz (80.6 kg)   SpO2 97%   BMI 26.24 kg/m   Wt Readings from Last 3 Encounters:  01/14/17 177 lb 11.2  oz (80.6 kg)  09/15/16 174 lb (78.9 kg)  02/19/16 174 lb (78.9 kg)    Physical Exam  Constitutional: He appears well-developed and well-nourished.  HENT:  Head: Normocephalic and atraumatic.  Right Ear: Tympanic membrane, external ear and ear canal normal.  Left Ear: Tympanic membrane, external ear and ear canal normal.  Mouth/Throat: Oropharynx is clear and moist and mucous membranes are normal. No oropharyngeal exudate.  Eyes: No scleral icterus.  Neck: No JVD present.  Cardiovascular: Normal rate and regular rhythm.   Pulmonary/Chest: Effort normal and breath sounds normal.  Abdominal: He exhibits no distension.  Musculoskeletal: He exhibits no edema.  Neurological: He is alert.  Skin: Skin is warm. No pallor.  Psychiatric: He has a normal mood and affect.        Assessment & Plan:   Problem  List Items Addressed This Visit      Cardiovascular and Mediastinum   Essential hypertension - Primary (Chronic)    Not controlled; patient has been off of CCB, has been under significant stress with death of ex-wife, and has been taking decongestants and NSAIDs; will start back on the CCB, encouraged stress reduction, and STOP decongestants and NSAIDs; try DASH guidelines; pt to monitor his BP and notify me if SBP not under 140; close f/u encouraged      Relevant Medications   amLODipine (NORVASC) 10 MG tablet   metoprolol succinate (TOPROL-XL) 100 MG 24 hr tablet   Other Relevant Orders   TSH (Completed)     Digestive   GERD (gastroesophageal reflux disease)    PPI, avoidance of NSAIDs, alcohol      Relevant Medications   pantoprazole (PROTONIX) 40 MG tablet   Fatty liver    Patient binge drinks occasionally; will check LFTs      Relevant Orders   COMPLETE METABOLIC PANEL WITH GFR (Completed)     Endocrine   IFG (impaired fasting glucose)    Check glucose and A1c      Relevant Orders   Hemoglobin A1c (Completed)     Other   Snoring    I explained that sleep apnea can exacerbate HTN and can lead to CHF; recommended sleep evalu, but patient politely declined my recommendation for sleep evaluation to r/o OSA, open invitation on the chart if he changes his mind      Relevant Orders   Ambulatory referral to ENT   Medication monitoring encounter   Hyperlipidemia (Chronic)    Check lipids today      Relevant Medications   amLODipine (NORVASC) 10 MG tablet   metoprolol succinate (TOPROL-XL) 100 MG 24 hr tablet   Other Relevant Orders   Lipid panel (Completed)   Elevated liver function tests    Hx of binge drinking; recheck labs      Relevant Orders   CBC with Differential/Platelet (Completed)   COMPLETE METABOLIC PANEL WITH GFR (Completed)   Depression    Supportive listening with loss of ex-wife; patient wishes to continue SNRI at current dose; I don't think  dose is high enough to impact his BP      Relevant Medications   venlafaxine XR (EFFEXOR-XR) 37.5 MG 24 hr capsule   Anxiety   Relevant Medications   venlafaxine XR (EFFEXOR-XR) 37.5 MG 24 hr capsule   Alcohol abuse, episodic    Other Visit Diagnoses    Otalgia of left ear       Relevant Orders   Ambulatory referral to ENT  Follow up plan: Return in about 3 weeks (around 02/04/2017) for blood pressure with Dr. Sherie Don.  An after-visit summary was printed and given to the patient at check-out.  Please see the patient instructions which may contain other information and recommendations beyond what is mentioned above in the assessment and plan.  Meds ordered this encounter  Medications  . venlafaxine XR (EFFEXOR-XR) 37.5 MG 24 hr capsule    Sig: Take 1 capsule (37.5 mg total) by mouth daily with breakfast.    Dispense:  30 capsule    Refill:  6  . pantoprazole (PROTONIX) 40 MG tablet    Sig: Take 1 tablet (40 mg total) by mouth daily.    Dispense:  30 tablet    Refill:  6  . amLODipine (NORVASC) 10 MG tablet    Sig: Take 1 tablet (10 mg total) by mouth every morning.    Dispense:  30 tablet    Refill:  6  . metoprolol succinate (TOPROL-XL) 100 MG 24 hr tablet    Sig: Take 1 tablet (100 mg total) by mouth daily. Take with or immediately following a meal.    Dispense:  30 tablet    Refill:  6    Orders Placed This Encounter  Procedures  . CBC with Differential/Platelet  . COMPLETE METABOLIC PANEL WITH GFR  . Lipid panel  . Hemoglobin A1c  . TSH  . Ambulatory referral to ENT

## 2017-01-15 LAB — TSH: TSH: 1.14 mIU/L (ref 0.40–4.50)

## 2017-01-15 LAB — HEMOGLOBIN A1C
Hgb A1c MFr Bld: 5.1 % (ref <5.7)
Mean Plasma Glucose: 100 mg/dL

## 2017-04-23 IMAGING — CR DG CHEST 2V
2 series · 2 of 2 positions shown · non-contrast
Comparison: 12/22/2013

CLINICAL DATA: Chest pain

EXAM:
CHEST  2 VIEW

[chest pa]
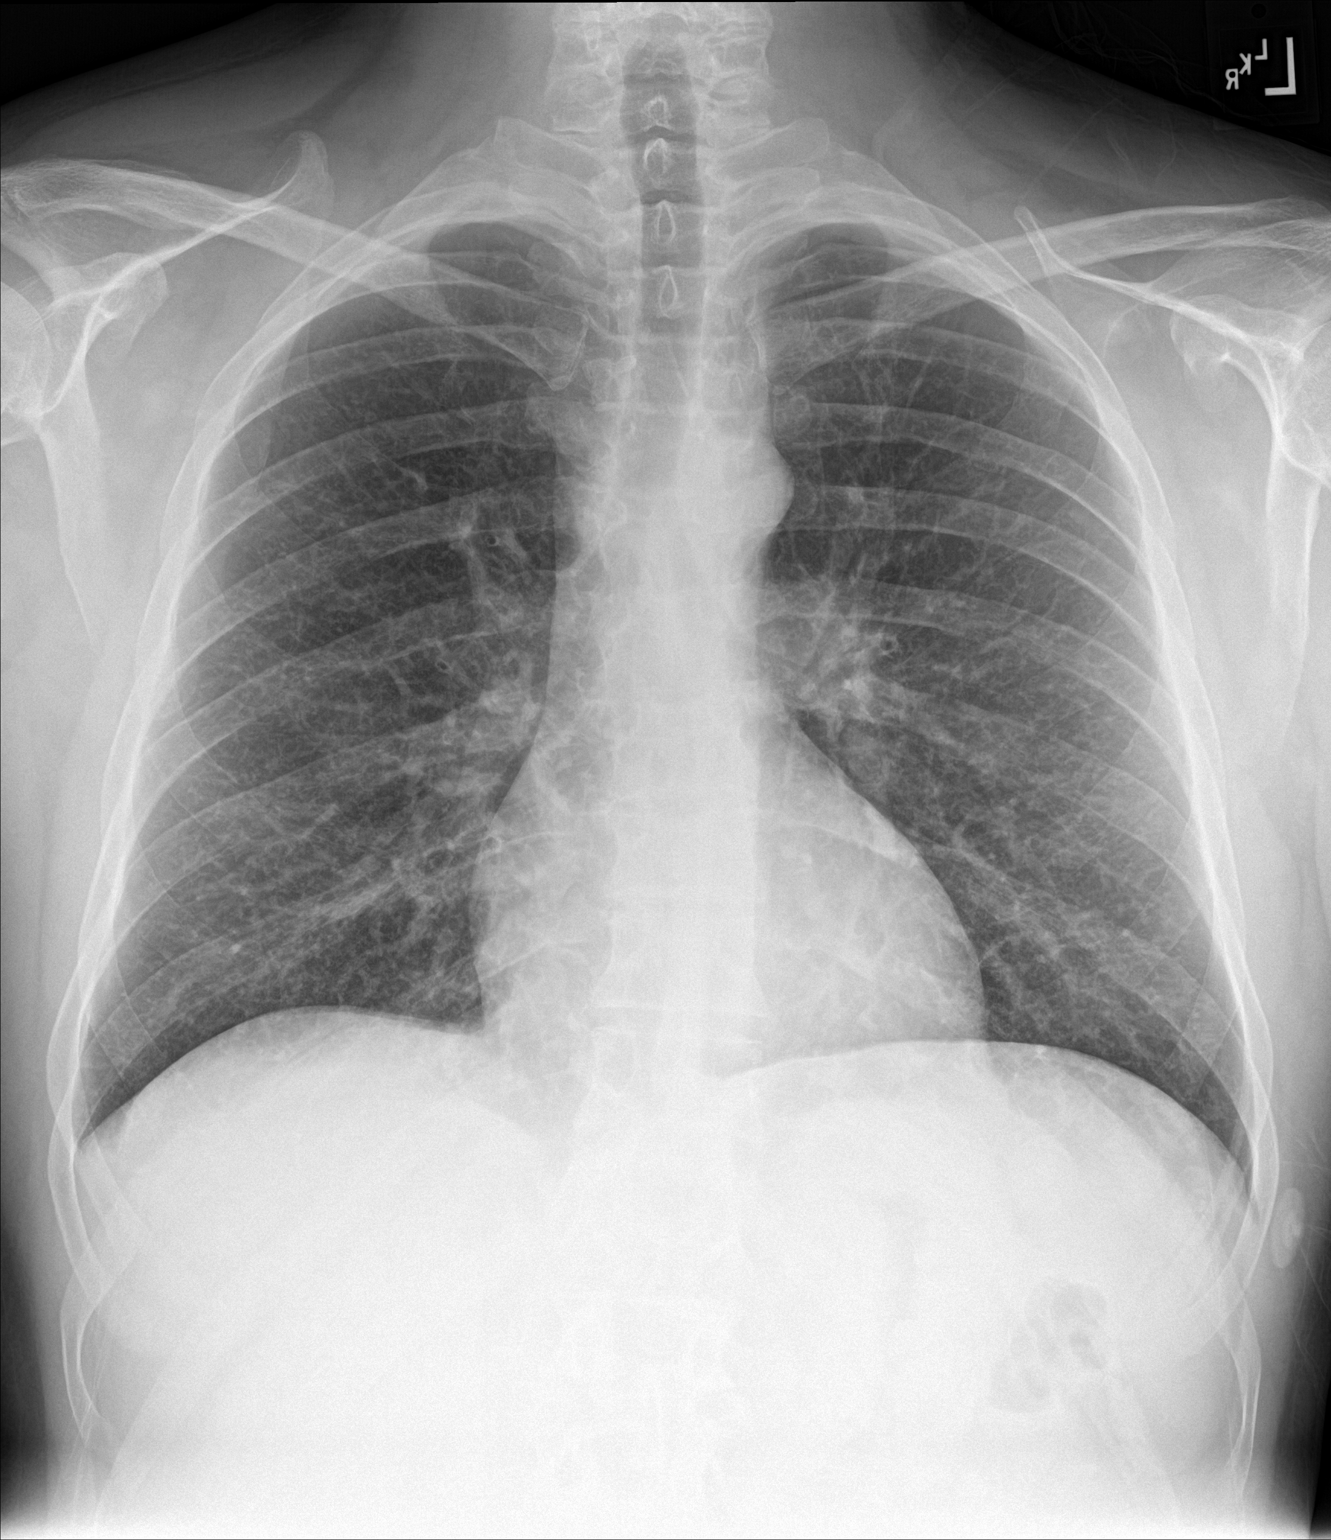

[chest lat]
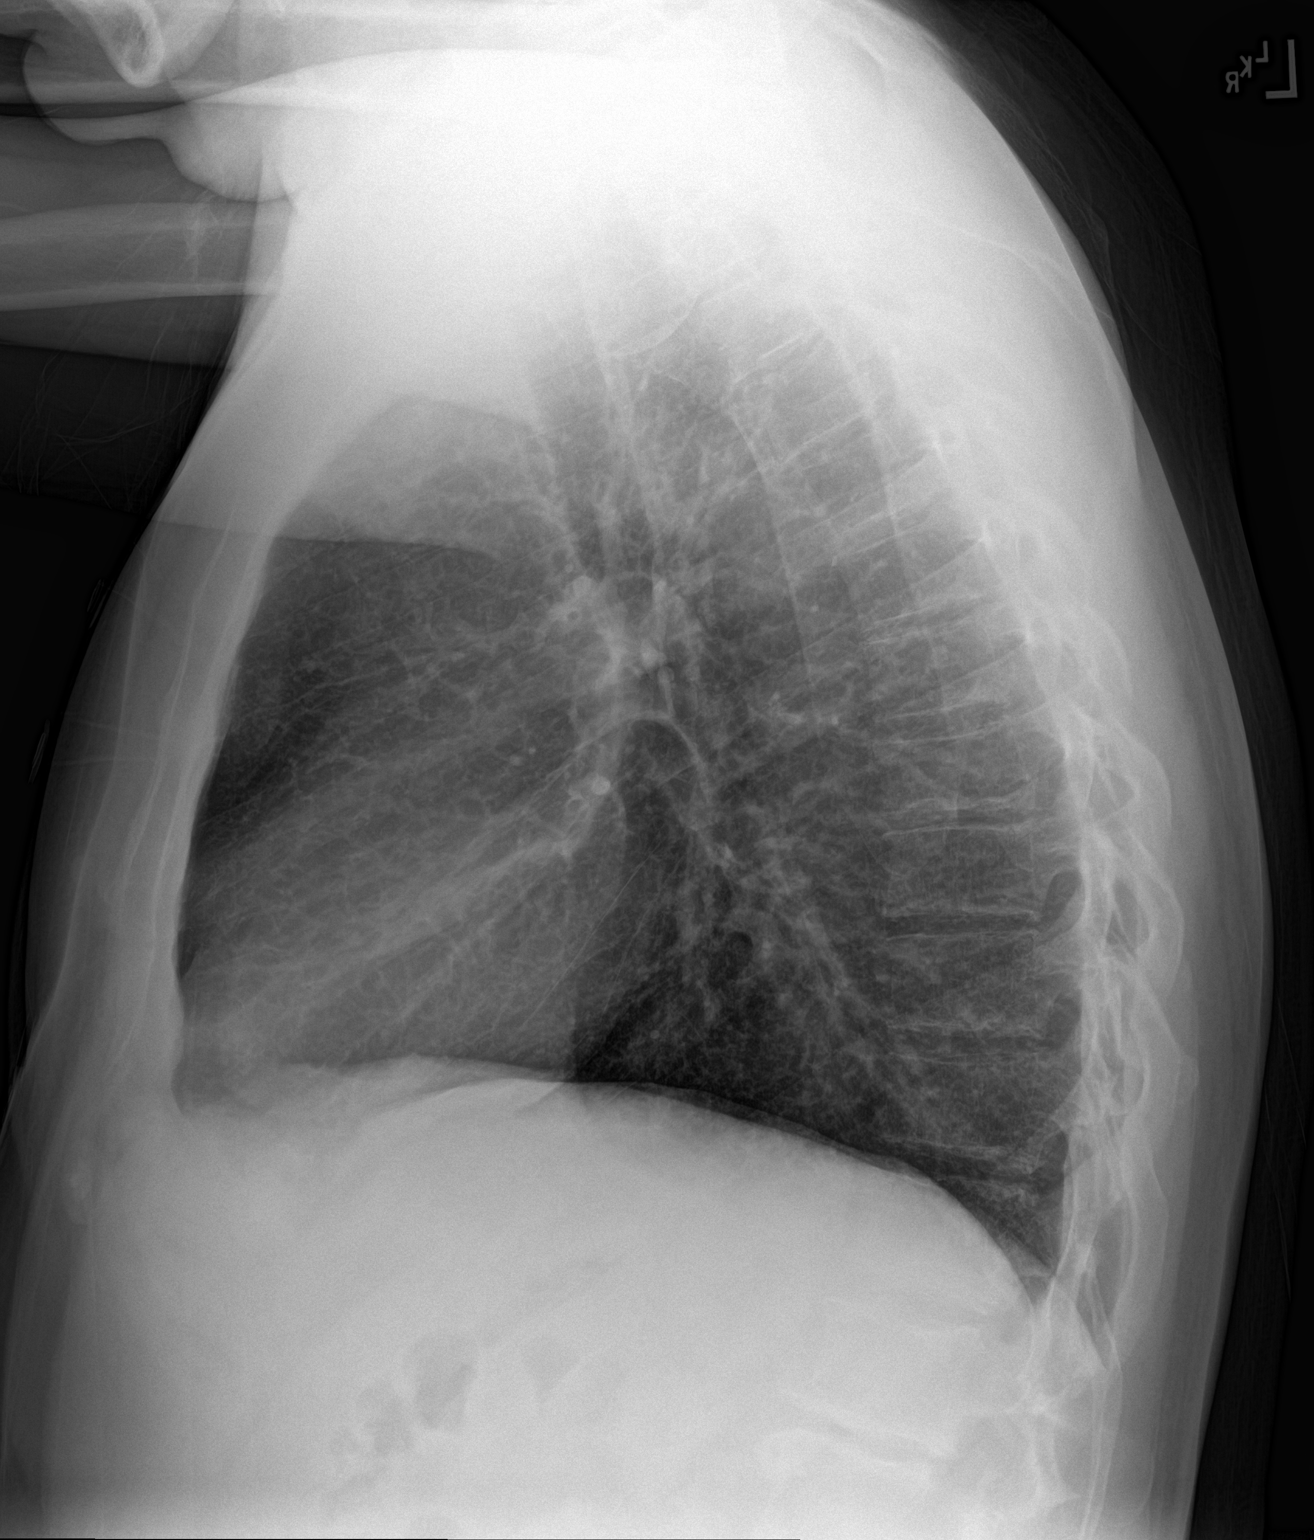

[2 of 2 positions shown; findings below may reference images not displayed]

FINDINGS: The heart size and mediastinal contours are within normal limits.
Both lungs are clear. The visualized skeletal structures are
unremarkable.
IMPRESSION: No active cardiopulmonary disease.

## 2017-07-12 ENCOUNTER — Emergency Department (HOSPITAL_COMMUNITY): Payer: Managed Care, Other (non HMO)

## 2017-07-12 ENCOUNTER — Encounter (HOSPITAL_COMMUNITY): Payer: Self-pay | Admitting: *Deleted

## 2017-07-12 ENCOUNTER — Inpatient Hospital Stay (HOSPITAL_COMMUNITY)
Admission: EM | Admit: 2017-07-12 | Discharge: 2017-07-12 | DRG: 894 | Discharge status: Against Medical Advice | Payer: Managed Care, Other (non HMO) | Attending: Internal Medicine | Admitting: Internal Medicine

## 2017-07-12 DIAGNOSIS — F10239 Alcohol dependence with withdrawal, unspecified: Principal | ICD-10-CM | POA: Diagnosis present

## 2017-07-12 DIAGNOSIS — Z833 Family history of diabetes mellitus: Secondary | ICD-10-CM | POA: Diagnosis not present

## 2017-07-12 DIAGNOSIS — Z823 Family history of stroke: Secondary | ICD-10-CM

## 2017-07-12 DIAGNOSIS — F419 Anxiety disorder, unspecified: Secondary | ICD-10-CM | POA: Diagnosis present

## 2017-07-12 DIAGNOSIS — Z8249 Family history of ischemic heart disease and other diseases of the circulatory system: Secondary | ICD-10-CM

## 2017-07-12 DIAGNOSIS — Z79899 Other long term (current) drug therapy: Secondary | ICD-10-CM | POA: Diagnosis not present

## 2017-07-12 DIAGNOSIS — Z96642 Presence of left artificial hip joint: Secondary | ICD-10-CM | POA: Diagnosis present

## 2017-07-12 DIAGNOSIS — E872 Acidosis: Secondary | ICD-10-CM

## 2017-07-12 DIAGNOSIS — D72829 Elevated white blood cell count, unspecified: Secondary | ICD-10-CM

## 2017-07-12 DIAGNOSIS — E785 Hyperlipidemia, unspecified: Secondary | ICD-10-CM | POA: Diagnosis present

## 2017-07-12 DIAGNOSIS — R4781 Slurred speech: Secondary | ICD-10-CM | POA: Diagnosis present

## 2017-07-12 DIAGNOSIS — K219 Gastro-esophageal reflux disease without esophagitis: Secondary | ICD-10-CM | POA: Diagnosis present

## 2017-07-12 DIAGNOSIS — E876 Hypokalemia: Secondary | ICD-10-CM | POA: Diagnosis present

## 2017-07-12 DIAGNOSIS — I10 Essential (primary) hypertension: Secondary | ICD-10-CM

## 2017-07-12 DIAGNOSIS — F17210 Nicotine dependence, cigarettes, uncomplicated: Secondary | ICD-10-CM

## 2017-07-12 DIAGNOSIS — Z888 Allergy status to other drugs, medicaments and biological substances status: Secondary | ICD-10-CM | POA: Diagnosis not present

## 2017-07-12 DIAGNOSIS — I160 Hypertensive urgency: Secondary | ICD-10-CM | POA: Diagnosis present

## 2017-07-12 DIAGNOSIS — Z7982 Long term (current) use of aspirin: Secondary | ICD-10-CM | POA: Diagnosis not present

## 2017-07-12 DIAGNOSIS — F8081 Childhood onset fluency disorder: Secondary | ICD-10-CM | POA: Diagnosis not present

## 2017-07-12 DIAGNOSIS — Z5321 Procedure and treatment not carried out due to patient leaving prior to being seen by health care provider: Secondary | ICD-10-CM

## 2017-07-12 DIAGNOSIS — F10939 Alcohol use, unspecified with withdrawal, unspecified: Secondary | ICD-10-CM | POA: Diagnosis present

## 2017-07-12 DIAGNOSIS — F329 Major depressive disorder, single episode, unspecified: Secondary | ICD-10-CM | POA: Diagnosis present

## 2017-07-12 LAB — PROTIME-INR
INR: 0.95
Prothrombin Time: 12.6 seconds (ref 11.4–15.2)

## 2017-07-12 LAB — I-STAT CHEM 8, ED
BUN: 14 mg/dL (ref 6–20)
Calcium, Ion: 1 mmol/L — ABNORMAL LOW (ref 1.15–1.40)
Chloride: 100 mmol/L — ABNORMAL LOW (ref 101–111)
Creatinine, Ser: 0.7 mg/dL (ref 0.61–1.24)
Glucose, Bld: 150 mg/dL — ABNORMAL HIGH (ref 65–99)
HCT: 47 % (ref 39.0–52.0)
Hemoglobin: 16 g/dL (ref 13.0–17.0)
Potassium: 3.3 mmol/L — ABNORMAL LOW (ref 3.5–5.1)
Sodium: 135 mmol/L (ref 135–145)
TCO2: 23 mmol/L (ref 22–32)

## 2017-07-12 LAB — COMPREHENSIVE METABOLIC PANEL
ALT: 26 U/L (ref 17–63)
AST: 36 U/L (ref 15–41)
Albumin: 4.5 g/dL (ref 3.5–5.0)
Alkaline Phosphatase: 107 U/L (ref 38–126)
Anion gap: 18 — ABNORMAL HIGH (ref 5–15)
BUN: 13 mg/dL (ref 6–20)
CO2: 20 mmol/L — ABNORMAL LOW (ref 22–32)
Calcium: 9.3 mg/dL (ref 8.9–10.3)
Chloride: 97 mmol/L — ABNORMAL LOW (ref 101–111)
Creatinine, Ser: 0.92 mg/dL (ref 0.61–1.24)
GFR calc Af Amer: >60 mL/min (ref >60)
GFR calc non Af Amer: >60 mL/min (ref >60)
Glucose, Bld: 148 mg/dL — ABNORMAL HIGH (ref 65–99)
Potassium: 3.4 mmol/L — ABNORMAL LOW (ref 3.5–5.1)
Sodium: 135 mmol/L (ref 135–145)
Total Bilirubin: 1.5 mg/dL — ABNORMAL HIGH (ref 0.3–1.2)
Total Protein: 7.4 g/dL (ref 6.5–8.1)

## 2017-07-12 LAB — CBC
HCT: 44.7 % (ref 39.0–52.0)
Hemoglobin: 16.3 g/dL (ref 13.0–17.0)
MCH: 30.3 pg (ref 26.0–34.0)
MCHC: 36.5 g/dL — ABNORMAL HIGH (ref 30.0–36.0)
MCV: 83.1 fL (ref 78.0–100.0)
Platelets: 237 10*3/uL (ref 150–400)
RBC: 5.38 MIL/uL (ref 4.22–5.81)
RDW: 13.8 % (ref 11.5–15.5)
WBC: 12.9 10*3/uL — ABNORMAL HIGH (ref 4.0–10.5)

## 2017-07-12 LAB — DIFFERENTIAL
Basophils Absolute: 0 10*3/uL (ref 0.0–0.1)
Basophils Relative: 0 %
Eosinophils Absolute: 0 10*3/uL (ref 0.0–0.7)
Eosinophils Relative: 0 %
Lymphocytes Relative: 11 %
Lymphs Abs: 1.4 10*3/uL (ref 0.7–4.0)
Monocytes Absolute: 0.5 10*3/uL (ref 0.1–1.0)
Monocytes Relative: 4 %
Neutro Abs: 11 10*3/uL — ABNORMAL HIGH (ref 1.7–7.7)
Neutrophils Relative %: 85 %

## 2017-07-12 LAB — MAGNESIUM: Magnesium: 1.6 mg/dL — ABNORMAL LOW (ref 1.7–2.4)

## 2017-07-12 LAB — URINALYSIS, ROUTINE W REFLEX MICROSCOPIC
Bilirubin Urine: NEGATIVE
Glucose, UA: 150 mg/dL — AB
Ketones, ur: 80 mg/dL — AB
Leukocytes, UA: NEGATIVE
Nitrite: NEGATIVE
Protein, ur: 100 mg/dL — AB
Specific Gravity, Urine: 1.024 (ref 1.005–1.030)
pH: 6 (ref 5.0–8.0)

## 2017-07-12 LAB — ETHANOL: Alcohol, Ethyl (B): <10 mg/dL (ref <10)

## 2017-07-12 LAB — RAPID URINE DRUG SCREEN, HOSP PERFORMED
Amphetamines: NOT DETECTED
Barbiturates: NOT DETECTED
Benzodiazepines: NOT DETECTED
Cocaine: NOT DETECTED
Opiates: NOT DETECTED
Tetrahydrocannabinol: NOT DETECTED

## 2017-07-12 LAB — CBG MONITORING, ED: Glucose-Capillary: 163 mg/dL — ABNORMAL HIGH (ref 65–99)

## 2017-07-12 LAB — MRSA PCR SCREENING: MRSA by PCR: NEGATIVE

## 2017-07-12 LAB — APTT: aPTT: 23 seconds — ABNORMAL LOW (ref 24–36)

## 2017-07-12 LAB — I-STAT TROPONIN, ED: Troponin i, poc: 0.01 ng/mL (ref 0.00–0.08)

## 2017-07-12 LAB — PHOSPHORUS: Phosphorus: 2.4 mg/dL — ABNORMAL LOW (ref 2.5–4.6)

## 2017-07-12 MED ORDER — SODIUM CHLORIDE 0.9 % IV SOLN
INTRAVENOUS | Status: DC
  Administered 2017-07-12: 17:00:00 via INTRAVENOUS

## 2017-07-12 MED ORDER — LORAZEPAM 2 MG/ML IJ SOLN
0.0000 mg | Freq: Four times a day (QID) | INTRAMUSCULAR | Status: DC
  Administered 2017-07-12 (×2): 2 mg via INTRAVENOUS
  Filled 2017-07-12 (×2): qty 1

## 2017-07-12 MED ORDER — ACETAMINOPHEN 325 MG PO TABS: 650.0000 mg | ORAL_TABLET | Freq: Four times a day (QID) | ORAL | Status: DC | PRN

## 2017-07-12 MED ORDER — VITAMIN B-1 100 MG PO TABS
100.0000 mg | ORAL_TABLET | Freq: Every day | ORAL | Status: DC
  Administered 2017-07-12: 100 mg via ORAL
  Filled 2017-07-12: qty 1

## 2017-07-12 MED ORDER — ACETAMINOPHEN 650 MG RE SUPP: 650.0000 mg | Freq: Four times a day (QID) | RECTAL | Status: DC | PRN

## 2017-07-12 MED ORDER — LORAZEPAM 1 MG PO TABS: 0.0000 mg | ORAL_TABLET | Freq: Four times a day (QID) | ORAL | Status: DC

## 2017-07-12 MED ORDER — ADULT MULTIVITAMIN W/MINERALS CH
1.0000 | ORAL_TABLET | Freq: Every day | ORAL | Status: DC
  Administered 2017-07-12: 1 via ORAL
  Filled 2017-07-12: qty 1

## 2017-07-12 MED ORDER — LORAZEPAM 2 MG/ML IJ SOLN
1.0000 mg | Freq: Once | INTRAMUSCULAR | Status: AC
  Administered 2017-07-12: 1 mg via INTRAVENOUS
  Filled 2017-07-12: qty 1

## 2017-07-12 MED ORDER — VENLAFAXINE HCL ER 37.5 MG PO CP24
37.5000 mg | ORAL_CAPSULE | Freq: Every day | ORAL | Status: DC
  Filled 2017-07-12: qty 1

## 2017-07-12 MED ORDER — ASPIRIN EC 81 MG PO TBEC: 81.0000 mg | DELAYED_RELEASE_TABLET | Freq: Every day | ORAL | Status: DC

## 2017-07-12 MED ORDER — LORAZEPAM 1 MG PO TABS: 0.0000 mg | ORAL_TABLET | Freq: Two times a day (BID) | ORAL | Status: DC

## 2017-07-12 MED ORDER — METOPROLOL SUCCINATE ER 100 MG PO TB24
100.0000 mg | ORAL_TABLET | Freq: Every day | ORAL | Status: DC
  Administered 2017-07-12: 100 mg via ORAL
  Filled 2017-07-12: qty 1

## 2017-07-12 MED ORDER — PANTOPRAZOLE SODIUM 40 MG PO TBEC
40.0000 mg | DELAYED_RELEASE_TABLET | Freq: Every day | ORAL | Status: DC
  Administered 2017-07-12: 40 mg via ORAL
  Filled 2017-07-12: qty 1

## 2017-07-12 MED ORDER — THIAMINE HCL 100 MG/ML IJ SOLN: 100.0000 mg | Freq: Every day | INTRAMUSCULAR | Status: DC

## 2017-07-12 MED ORDER — FOLIC ACID 1 MG PO TABS
1.0000 mg | ORAL_TABLET | Freq: Every day | ORAL | Status: DC
  Administered 2017-07-12: 1 mg via ORAL
  Filled 2017-07-12: qty 1

## 2017-07-12 MED ORDER — ENOXAPARIN SODIUM 40 MG/0.4ML ~~LOC~~ SOLN
40.0000 mg | SUBCUTANEOUS | Status: DC
  Administered 2017-07-12: 40 mg via SUBCUTANEOUS
  Filled 2017-07-12: qty 0.4

## 2017-07-12 MED ORDER — LORAZEPAM 2 MG/ML IJ SOLN
2.0000 mg | INTRAMUSCULAR | Status: DC | PRN
  Administered 2017-07-12 (×2): 2 mg via INTRAVENOUS
  Filled 2017-07-12 (×2): qty 1

## 2017-07-12 MED ORDER — POTASSIUM CHLORIDE CRYS ER 20 MEQ PO TBCR
40.0000 meq | EXTENDED_RELEASE_TABLET | Freq: Once | ORAL | Status: AC
  Administered 2017-07-12: 40 meq via ORAL
  Filled 2017-07-12: qty 2

## 2017-07-12 MED ORDER — NICOTINE 21 MG/24HR TD PT24
21.0000 mg | MEDICATED_PATCH | Freq: Every day | TRANSDERMAL | Status: DC
  Administered 2017-07-12: 21 mg via TRANSDERMAL
  Filled 2017-07-12: qty 1

## 2017-07-12 MED ORDER — AMLODIPINE BESYLATE 10 MG PO TABS
10.0000 mg | ORAL_TABLET | Freq: Every morning | ORAL | Status: DC
  Administered 2017-07-12: 10 mg via ORAL
  Filled 2017-07-12: qty 2

## 2017-07-12 MED ORDER — LORAZEPAM 2 MG/ML IJ SOLN: 0.0000 mg | Freq: Two times a day (BID) | INTRAMUSCULAR | Status: DC

## 2017-07-12 NOTE — ED Notes (Signed)
Patient transported to X-ray 

## 2017-07-12 NOTE — ED Notes (Signed)
[name] White -- [phone] wife

## 2017-07-12 NOTE — ED Notes (Signed)
Darel Hong -- wife-- [phone]

## 2017-07-12 NOTE — ED Notes (Signed)
Regular diet ordered.

## 2017-07-12 NOTE — H&P (Signed)
Date: 07/12/2017               Patient Name:  [name] White MRN: [id number]  DOB: Oct 03, 1971 Age / Sex: 46 y.o., male   PCP: Kerman Passey, MD         Medical Service: Internal Medicine Teaching Service         Attending Physician: Dr. Sandre Kitty Elwin Mocha, MD    First Contact: Dr. Saunders Revel Pager: [phone]  Second Contact: Dr. Mikey Bussing  Pager: [phone]       After Hours (After 5p/  First Contact Pager: [phone]  weekends / holidays): Second Contact Pager: [phone]   Chief Complaint: Lightheadedness, tremors, slurred speech  History of Present Illness: Patient is a 46 year old male with a past medical history of hypertension, hyperlipidemia, alcohol use disorder, depression, anxiety, GERD presenting to the hospital via EMS as a code stroke. Patient states he started experiencing lightheadedness at around 7 AM this morning when standing on his porch. Also reports experiencing a severe occipital headache, tremors in his arms and legs, and slurring of speech at that time. States his daughter called the EMS. Per patient, symptoms lasted approximately 40 minutes and then resolved. Denies any prior history of seizures. Denies having any tongue biting or incontinence. Denies noticing any change in his vision. States he was feeling well until this morning. Denies having any fevers or chills. Denies loss of consciousness or chest pain. Denies experiencing similar symptoms in the past. No other complaints.  Vitals on arrival showing temperature 98.6F, pulse 93, respiratory rate 14, blood pressure 160/98, and O2 sat 95% on room air. CT head, MRI and MRA of brain done in the ED were negative for acute infarct. Patient was noted to be tremulous and diaphoretic and his CIWA score was 13. Labs showing mild hypokalemia with potassium 3.3. White count 12.9. UA and chest x-ray not suggestive of infection. CBG in the 140s to 150s. UDS negative. EKG showing normal sinus rhythm.   Patient reports drinking  "fifth of vodka" on occasion. Per conversation ED provider, patient's wife reported he was drinking "fifth of vodka" daily for the past 3 days. Patient states his last drink was 10/6 evening. States he was went to rehabilitation 2-3 years ago after a hospital admission for alcohol withdrawal. He is interested in quitting.  Meds:  Current Meds  Medication Sig  . amLODipine (NORVASC) 10 MG tablet Take 1 tablet (10 mg total) by mouth every morning.  Marland Kitchen aspirin EC 81 MG tablet Take 81 mg by mouth daily.  . metoprolol succinate (TOPROL-XL) 100 MG 24 hr tablet Take 1 tablet (100 mg total) by mouth daily. Take with or immediately following a meal.  . Multiple Vitamin (MULTIVITAMIN WITH MINERALS) TABS tablet Take 1 tablet by mouth daily.  . pantoprazole (PROTONIX) 40 MG tablet Take 1 tablet (40 mg total) by mouth daily.  . potassium chloride SA (K-DUR,KLOR-CON) 20 MEQ tablet Take 40 mEq by mouth every morning.  . venlafaxine XR (EFFEXOR-XR) 37.5 MG 24 hr capsule Take 1 capsule (37.5 mg total) by mouth daily with breakfast.     Allergies: Allergies as of 07/12/2017 - Review Complete 07/12/2017  Allergen Reaction Noted  . Ace inhibitors Anaphylaxis 05/17/2015  . Captopril Anaphylaxis 12/19/2013  . Enalapril Anaphylaxis 12/19/2013  . Fosinopril Anaphylaxis 12/19/2013  . Lisinopril Anaphylaxis 12/19/2013  . Ramipril Anaphylaxis 12/19/2013   Past Medical History:  Diagnosis Date  . Alcohol abuse, in remission   . Anxiety   .  Arthritis   . Avascular necrosis (HCC)   . Depression   . GERD (gastroesophageal reflux disease)   . History of alcohol abuse   . Hyperlipidemia   . Hypertension   . IFG (impaired fasting glucose)   . Overweight     Family History: Mother had diabetes.  Social History: Smoking 1 pack of cigarettes per day since age of 71. Drinks vodka (see HPI). Denies illicit drug use.  Review of Systems: A complete ROS was negative except as per HPI.   Physical Exam: Blood  pressure (!) 151/87, pulse 97, temperature 98.8 F (37.1 C), temperature source Oral, resp. rate 16, height  (1.753 m), weight 176 lb 12.8 oz (80.2 kg), SpO2 95 %. Physical Exam  Constitutional: He is oriented to person, place, and time. He appears well-developed and well-nourished. No distress.  Resting comfortably in bed watching the television Mildly tremulous  HENT:  Head: Normocephalic and atraumatic.  Mouth/Throat: Oropharynx is clear and moist.  Eyes: Right eye exhibits no discharge. Left eye exhibits no discharge.  Neck: Neck supple. No tracheal deviation present.  Cardiovascular: Normal rate, regular rhythm and intact distal pulses.  Exam reveals no gallop and no friction rub.   No murmur heard. Pulmonary/Chest: Effort normal and breath sounds normal. No respiratory distress. He has no wheezes. He has no rales.  Abdominal: Soft. Bowel sounds are normal. He exhibits no distension. There is no tenderness.  Musculoskeletal: He exhibits no edema.  Neurological: He is alert and oriented to person, place, and time. No cranial nerve deficit.  Strength 5 out of 5 and sensation to light touch intact throughout.  Skin: Skin is warm. No rash noted.  Appears flushed Mildly diaphoretic  Psychiatric: His behavior is normal.    EKG: personally reviewed my interpretation is normal sinus rhythm (heart rate 99). QTC 480.  CXR: personally reviewed my interpretation is mild bronchitic changes.  Assessment & Plan by Problem: Active Problems:   Alcohol withdrawal (HCC)  46 year old male with a past medical history of hypertension, hyperlipidemia, alcohol use disorder, depression, anxiety, GERD presenting to the hospital via EMS as a code stroke.   Alcohol withdrawal: Patient has a history of alcohol abuse and been drinking vodka consistently; last drink 10/6. His lightheadedness and tremulousness could be explained by alcohol withdrawal as his CIWA score was 13 in the ED. Does have a  history of prior hospital admission for alcohol withdrawal and has been to rehabilitation as well. Unclear etiology of slurring of speech which resolved. CT head, MRI and MRA of brain not suggestive of an acute infarct. Patient was evaluated by neurology and they do not believe his symptoms are related to a stroke. Neurology is not recommending any further neurological testing. -Monitor in stepdown. His last drink was less than 48 hours ago and patient is high risk for severe withdrawal/ DT in the upcoming 24 hours. -CIWA monitoring -Ativan per CIWA -Thiamine, folate, multivitamin -Check mag and phosphorus levels -Patient is interested in detox. He will benefit from outpatient rehabilitation after this hospitalization.   Mild alcoholic ketoacidosis: Anion gap mildly increased at 18 and bicarbonate borderline low at 20. UA positive for ketones. Patient has good by mouth intake. CBG in 140s to 150s. -NS@150  cc/hr -Repeat BMP in a.m.  Mild leukocytosis: White blood cell 12.9. Chest x-ray and UA not suggestive of infection. Patient is afebrile. Mild leukocytosis likely due to stress. -Repeat CBC in a.m.  Mild hypokalemia: Potassium 3.3. Patient is not on a diuretic. -K-Dur  40 meq once -Repeat BMP in a.m.  Hypertension: Blood pressure 160/98 on arrival. Patient was not able to take his medications in the morning. -Continue home amlodipine 10 mg daily and metoprolol succinate 100 mg daily  Tobacco use: Current 1 pack per day smoker. -Nicotine patch  GERD: Stable. -Protonix 40 mg daily  Depression -Continue home venlafaxine 37.5 mg daily  Diet: Heart healthy DVT prophylaxis: Lovenox  Dispo: Admit patient to Inpatient with expected length of stay greater than 2 midnights.  Signed: John Giovanni, MD 07/12/2017, 4:06 PM  Pager: [phone]

## 2017-07-12 NOTE — Consult Note (Signed)
Neurology Consultation  Reason for Consult: acute code stroke Referring Physician: Dr Anitra Lauth  CC: Speech problems, RSW  History is obtained from: patient and EMS  HPI: [name] White is a 46 y.o. male with PMH of EtOH abuse, Anxiety, Depresion, HTN, HLD who woke up normally this AM and was then not able to talk normally. He had stuttering speech. EMS was called and they noted that he was hypertensive in the SBP 170s and was having some non-rhythmic jerky movements of all 4 ext intermittently. No LOC or alteration of awareness throughout. He endorses using alcohol over the weekend. Did not quantify how much. Denies drug use. He is supposed to be on antiHTN meds but unsure of compliance. He also had generalized weakness and was non focal. He had no facial weakness. No seizure activity noted en-route or reported by wherever EMS obtained their history from.  LKW: 0700 07/12/2017 tpa given?: no, not stroke, HTN urgency Premorbid modified Rankin scale (mRS):  0  ROS: A 14 point ROS was performed and is negative except as noted in the HPI.   Past Medical History:  Diagnosis Date  . Alcohol abuse, in remission   . Anxiety   . Arthritis   . Avascular necrosis (HCC)   . Depression   . GERD (gastroesophageal reflux disease)   . History of alcohol abuse   . Hyperlipidemia   . Hypertension   . IFG (impaired fasting glucose)   . Overweight     Family History  Problem Relation Age of Onset  . Hypertension Mother   . Diabetes Mother   . Hypertension Father   . Stroke Father   . Cancer Neg Hx   . COPD Neg Hx   . Heart disease Neg Hx     Social History:   reports that he has been smoking Cigarettes.  He has a 30.00 pack-year smoking history. He has never used smokeless tobacco. He reports that he drinks alcohol. He reports that he does not use drugs.  Medications  Current Facility-Administered Medications:  .  LORazepam (ATIVAN) injection 1 mg, 1 mg, Intravenous, Once, Milon Dikes, MD  Current Outpatient Prescriptions:  .  amLODipine (NORVASC) 10 MG tablet, Take 1 tablet (10 mg total) by mouth every morning., Disp: 30 tablet, Rfl: 6 .  LORazepam (ATIVAN) 1 MG tablet, Take 1 tablet (1 mg total) by mouth 2 (two) times daily as needed for anxiety. (Patient not taking: Reported on 01/14/2017), Disp: 10 tablet, Rfl: 0 .  metoprolol succinate (TOPROL-XL) 100 MG 24 hr tablet, Take 1 tablet (100 mg total) by mouth daily. Take with or immediately following a meal., Disp: 30 tablet, Rfl: 6 .  pantoprazole (PROTONIX) 40 MG tablet, Take 1 tablet (40 mg total) by mouth daily., Disp: 30 tablet, Rfl: 6 .  venlafaxine XR (EFFEXOR-XR) 37.5 MG 24 hr capsule, Take 1 capsule (37.5 mg total) by mouth daily with breakfast., Disp: 30 capsule, Rfl: 6  Exam: Current vital signs: BP (!) 160/98 (BP Location: Left Arm)   Pulse 93   Temp 98.5 F (36.9 C) (Oral)   Resp 14   SpO2 95%  Vital signs in last 24 hours: Temp:  [98.5 F (36.9 C)] 98.5 F (36.9 C) (10/08 0815) Pulse Rate:  [93] 93 (10/08 0815) Resp:  [14] 14 (10/08 0815) BP: (160)/(98) 160/98 (10/08 0815) SpO2:  [95 %] 95 % (10/08 0815) GENERAL: Awake, alert, very anxious looking HEENT: - Normocephalic and atraumatic, dry mm, no LN++, no Thyromegally, sclera  injected bilaterally LUNGS - Clear to auscultation bilaterally with no wheezes CV - S1S2 RRR, no m/r/g, equal pulses bilaterally. ABDOMEN - Soft, nontender, nondistended with normoactive BS Ext: warm, well perfused, intact peripheral pulses, no edema  NEURO:  Mental Status: AA&Ox3  Language: speech is stuttering.  Naming, repetition, fluency, and comprehension intact. Cranial Nerves: PERRL 68mm/brisk. EOMI, visual fields full, no facial asymmetry, facial sensation intact, hearing intact, tongue/uvula/soft palate midline, norma sternocleidomastoid and trapezius muscle strength. No evidence of tongue atrophy or fibrillations Motor: patient had weakness 4/5 in all four  extremities. He had effort dependent weakness on b/l LE (Hoover's +ve). Kept having non-rhythmic jerky movements, which seemed completely volitional Tone: is normal and bulk is normal Sensation- Intact to light touch bilaterally Coordination: FTN intact bilaterally, no ataxia in BLE. Gait- deferred  NIHSS - 1 (dysarthria - which is actually stuttering)  Labs I have reviewed labs in epic and the results pertinent to this consultation are:  CBC    Component Value Date/Time   WBC 12.9 (H) 07/12/2017 0745   RBC 5.38 07/12/2017 0745   HGB 16.0 07/12/2017 0805   HGB 16.0 11/06/2015 1419   HCT 47.0 07/12/2017 0805   HCT 43.9 11/06/2015 1419   PLT 237 07/12/2017 0745   PLT 169 11/06/2015 1419   MCV 83.1 07/12/2017 0745   MCV 91 11/06/2015 1419   MCH 30.3 07/12/2017 0745   MCHC 36.5 (H) 07/12/2017 0745   RDW 13.8 07/12/2017 0745   RDW 13.2 11/06/2015 1419   LYMPHSABS 1.4 07/12/2017 0745   LYMPHSABS 1.0 11/06/2015 1419   MONOABS 0.5 07/12/2017 0745   EOSABS 0.0 07/12/2017 0745   BASOSABS 0.0 07/12/2017 0745    CMP     Component Value Date/Time   NA 135 07/12/2017 0805   NA 139 11/06/2015 1419   K 3.3 (L) 07/12/2017 0805   CL 100 (L) 07/12/2017 0805   CO2 20 (L) 07/12/2017 0745   GLUCOSE 150 (H) 07/12/2017 0805   BUN 14 07/12/2017 0805   BUN 10 11/06/2015 1419   CREATININE 0.70 07/12/2017 0805   CREATININE 0.73 01/14/2017 1052   CALCIUM 9.3 07/12/2017 0745   PROT 7.4 07/12/2017 0745   PROT 6.8 11/06/2015 1419   ALBUMIN 4.5 07/12/2017 0745   ALBUMIN 4.4 11/06/2015 1419   AST 36 07/12/2017 0745   AST 70 (H) 11/06/2015 1419   ALT 26 07/12/2017 0745   ALT 45 11/06/2015 1419   ALKPHOS 107 07/12/2017 0745   BILITOT 1.5 (H) 07/12/2017 0745   BILITOT 1.1 11/06/2015 1419   GFRNONAA >60 07/12/2017 0745   GFRNONAA >89 01/14/2017 1052   GFRAA >60 07/12/2017 0745   GFRAA >89 01/14/2017 1052    Lipid Panel     Component Value Date/Time   CHOL 219 (H) 01/14/2017 1052    CHOL 224 (H) 11/06/2015 1419   TRIG 210 (H) 01/14/2017 1052   HDL 71 01/14/2017 1052   HDL 74 11/06/2015 1419   CHOLHDL 3.1 01/14/2017 1052   VLDL 42 (H) 01/14/2017 1052   LDLCALC 106 (H) 01/14/2017 1052   LDLCALC 108 (H) 11/06/2015 1419   Imaging I have reviewed the images obtained: CT-scan of the brain - noa cute changes, ASPECTS 10. MRI examination of the brain - stat MRI obtained - no stroke. MRA shows no vascular abnormalities in the cerebral vasculature including posterior circulation.  Assessment:  46/M with PMH as above, brought in as a code stroke for geenralized weakness and speech problem that started  at 0700 hrs. His exam showed stuttering speech and inconsistent  exam with some functional overlay in terms of the speech pattern and the weakness that he exhibited. Weakness also was non-focal, sparing the face. He did appear extremely anxious and was hypertensive on arrival. I obtained a stat MRI (stroke limited sequences) once the [facility] was negative for acute process. No stroke identified on MRI. MRA head was also obtained and unremarkable.  Not a candidate for stroke due to exam inconsistent with stroke. Not a candidate for EVT due to exam not c/w LVO.  Likely hypertensive urgency v possible etoh withdrawal v conversion disorder.  Recommendations: Treat HTN and anxiety UDS Management of EtoH withdrawal as deemed appropriate by ER. I would not recommend any further neurological testing and I do not think he needs admission from a purely neurological standpoint. Please call neurology as needed.  -- Milon Dikes, MD [facility] [phone]  If 7pm to 7am, please call on call as listed on AMION.   Critical care attestation This patient is critically ill and at significant risk of neurological worsening, death and care requires constant monitoring of vital signs, hemodynamics,respiratory and cardiac monitoring, neurological assessment, discussion with family,  other specialists and medical decision making of high complexity. I spent 60  minutes of neurocritical care time  in the care of  this patient.

## 2017-07-12 NOTE — ED Notes (Signed)
[place] given to pt // Admitting MD at bedside

## 2017-07-12 NOTE — Progress Notes (Addendum)
Was paged by nurse that patient wanted to leave AMA. Went to evaluate patient at bedside. He stated that he wanted to go home to be with his wife and children. He was oriented to person and time.  He was speaking clearly with linear and logical thinking.  I explained the risks of leaving and he made an informed decision to leave the hospital.  He stated he would follow up with his PCP.

## 2017-07-12 NOTE — Progress Notes (Signed)
Patient wanting to leave AMA. Notified IMTS. MD on floor and informed of situation.This nurse/MD Educated patient on reasons to stay in hospital. Patient states "I am going home". Patient pulled out IV's. No signs of bleeding noted. Patient left AMA.

## 2017-07-12 NOTE — Discharge Summary (Signed)
Name: [name] White MRN: [id number] DOB: [birth year] 46 y.o. PCP: Kerman Passey, MD  Date of Admission: 07/12/2017  7:57 AM Date of Discharge: 07/12/2017 Attending Physician: Dr. Jessy Oto, MD  Discharge Diagnosis:  Active Problems:   Alcohol withdrawal [facility])  Discharge Medications:  UNABLE TO  COMPLETE DISCHARGE MEDICATION RECONCILIATION AS PATIENT LEFT AGAINST MEDICAL ADVICE  Disposition and follow-up:   [name] White was discharged from [facility] in Fair condition AGAINST MEDICAL ADVICE.  At the hospital follow up visit please address:  1.  Patient was admitted with symptoms concerning for stroke, including intermittent dizziness, tremors of arms/legs, and severe onset headache. A code stroke was initiated on admission and evaluation was reassuring that patient did not have a stroke. Patient showed signs of alcohol withdrawal on admission (CIWA score 13) and patient requested admission for alcohol detoxification. Patient left AMA upon arrival to step-down unit. Patient unable to be redirected when primary team evaluated patient prior to elopement and risks of leaving during acute alcohol withdrawal were addressed with patient prior to discharge. Patient left on his own when primary team had to leave room to gather more information from staff regarding patient's symptoms of alcohol withdrawal upon arrival to floor.  2.  Labs / imaging needed at time of follow-up: None  3.  Pending labs/ test needing follow-up: None  Follow-up Appointments:  NONE SCHEDULED AS PATIENT LEFT AGAINST MEDICAL ADVICE  Hospital Course by problem list: Active Problems:   Alcohol withdrawal (HCC)   [name] White is a 46 yo with PMH HTN, HLD, alcohol use disorder, depression, anxiety, and GERD who presented to the hospital via EMS as a code stroke on 07/12/2017. The specific problems addressed during his admission were as follows:  Alcohol withdrawal: Patient  reported a history of alcohol abuse endorsed drinking vodka daily, with his last drink on 07/10/2017 about two days prior to presentations. His symptoms on admission included lightheadedness and tremulousness which could have been explained by alcohol withdrawal, however the patient had intermittent stuttering of his speech which was not consistent with withdrawal. A code stroke was initiated on admission to rule out a neurologic cause of his symptoms.A CT head, MRI and MRA of brain obtained on admission were not suggestive of an acute infarct. Neurology did not recommend further neurological testing as the patient's speech symptoms self-resolved while in ED. Patient wanted to be admitted for alcohol detoxification,per his request in ED, but left against medical advice upon arrival to stepdown unit (CIWA still 13 at time of discharge).   Mild alcoholic ketoacidosis: The patient's anion gap mildly increased at 18 and bicarbonate borderline was low at 20 on admission labs. Patient was given IVF during hospitalization to help resolve his metabolic acidosis. The patient pulled out his IV and self-discontinued fluids prior to leaving against medical advice.  Discharge Vitals:   BP (!) 147/95   Pulse 80   Temp 98.8 F (37.1 C) (Oral)   Resp 16   Ht  (1.753 m)   Wt 176 lb 12.8 oz (80.2 kg)   SpO2 95%   BMI 26.11 kg/m   Pertinent Labs, Studies, and Procedures:   CMP CMP Latest Ref Rng & Units 07/12/2017 07/12/2017 01/14/2017  Glucose 65 - 99 mg/dL 829(F) 621(H) 89  BUN 6 - 20 mg/dL Creatinine 0.61 - 1.24 mg/dL 0.86 5.78 4.69  Sodium 135 - 145 mmol/L 135 135 140  Potassium 3.5 - 5.1 mmol/L 3.3(L) 3.4(L)  3.9  Chloride 101 - 111 mmol/L 100(L) 97(L) 104  CO2 22 - 32 mmol/L - 20(L) 24  Calcium 8.9 - 10.3 mg/dL - 9.3 9.5  Total Protein 6.5 - 8.1 g/dL - 7.4 7.2  Total Bilirubin 0.3 - 1.2 mg/dL - 1.5(H) 0.4  Alkaline Phos 38 - 126 U/L - 107 87  AST 15 - 41 U/L - 36 27  ALT 17 - 63 U/L -  26 21   CBC CBC Latest Ref Rng & Units 07/12/2017 07/12/2017 01/14/2017  WBC 4.0 - 10.5 K/uL - 12.9(H) 6.0  Hemoglobin 13.0 - 17.0 g/dL 40.9 81.1 91.4  Hematocrit 39.0 - 52.0 % 47.0 44.7 45.9  Platelets 150 - 400 K/uL - 237 178   Ethanol <10  Urinalysis    Component Value Date/Time   COLORURINE YELLOW 07/12/2017 1138   APPEARANCEUR HAZY (A) 07/12/2017 1138   APPEARANCEUR Cloudy 10/10/2011 0554   LABSPEC 1.024 07/12/2017 1138   LABSPEC 1.020 10/10/2011 0554   PHURINE 6.0 07/12/2017 1138   GLUCOSEU 150 (A) 07/12/2017 1138   GLUCOSEU Negative 10/10/2011 0554   HGBUR SMALL (A) 07/12/2017 1138   BILIRUBINUR NEGATIVE 07/12/2017 1138   BILIRUBINUR Negative 10/10/2011 0554   KETONESUR 80 (A) 07/12/2017 1138   PROTEINUR 100 (A) 07/12/2017 1138   UROBILINOGEN 0.2 01/23/2014 1304   NITRITE NEGATIVE 07/12/2017 1138   LEUKOCYTESUR NEGATIVE 07/12/2017 1138   LEUKOCYTESUR Negative 10/10/2011 0554   Drugs of Abuse     Component Value Date/Time   LABOPIA NONE DETECTED 07/12/2017 1138   COCAINSCRNUR NONE DETECTED 07/12/2017 1138   LABBENZ NONE DETECTED 07/12/2017 1138   AMPHETMU NONE DETECTED 07/12/2017 1138   THCU NONE DETECTED 07/12/2017 1138   LABBARB NONE DETECTED 07/12/2017 1138    CT Head: IMPRESSION: 1. Normal noncontrast CT appearance of the brain. 2. ASPECTS is 10. 3. The above was relayed via text pager to Dr. Wilford Corner on 07/12/2017 at 08:14 .  MR Brain with MRA Head: IMPRESSION: 1. No evidence of acute infarct.  Limited diffusion-only Brain MRI. 2.  Negative intracranial MRA.  Discharge Instructions: PATIENT LEFT AGAINST MEDICAL ADVICE. PATIENT WAS INFORMED OF RISKS OF LEAVING AGAINST MEDICAL ADVICE DURING ACUTE ALCOHOL WITHDRAWAL (CIWA 13 ON ADMISSION AND UPON ELOPEMENT).  SignedRozann Lesches, MD 07/12/2017, 9:11 PM   Pager: [phone]

## 2017-07-12 NOTE — ED Notes (Signed)
Pt admits to drinking at least a fifth of liquor a day "for the past few days" -- wife states that it has been longer.

## 2017-07-12 NOTE — ED Provider Notes (Signed)
[facility] DEPT Provider Note   CSN: [id number] Arrival date & time: 07/12/17  1914     History   Chief Complaint No chief complaint on file.   HPI [name] White is a 46 y.o. male.  Patient is a 46 year old male with a history of prior alcohol abuse, hypertension, hyperlipidemia, depression, anxiety presenting by EMS as a code stroke.  It was reported that patient had sudden onset of slurred speech and generalized weakness. Patient is having intermittent itching but denies any pain. He is complaining of his right arm feeling very heavy as well as his legs.EMS found patient to be hypertensive. Further evaluation with the patient he states he woke up this morning was feeling his normal self and went out onto the back porch to have a cigarette. He took his morning medications and then started having difficulty speaking, a headache and vomiting. He felt generalized weakness and twitching throughout his body. He has a prior history of alcohol abuse but states he went to rehabilitation several years ago and occasionally will drink but does not binge drink anymore. He typically will have a drink on Thursday or Saturday when he is not working but does not drink regularly. He states he has gone through withdrawal before but never felt anything like this. He denies any other substance uses. He was on Ativan at one time but when asking patient he doesn't think he's been taking this medication for a while.   The history is provided by the patient and the EMS personnel.    Past Medical History:  Diagnosis Date  . Alcohol abuse, in remission   . Anxiety   . Arthritis   . Avascular necrosis (HCC)   . Depression   . GERD (gastroesophageal reflux disease)   . History of alcohol abuse   . Hyperlipidemia   . Hypertension   . IFG (impaired fasting glucose)   . Overweight     Patient Active Problem List   Diagnosis Date Noted  . Medication monitoring encounter 01/14/2017  . Abnormal  nuclear stress test 12/31/2015  . Acute epigastric pain 11/06/2015  . Snoring 11/06/2015  . Elevated liver function tests 05/19/2015  . Fatty liver 05/19/2015  . Elevated serum GGT level 05/17/2015  . IFG (impaired fasting glucose)   . Hyperlipidemia   . Essential hypertension   . Depression   . Anxiety   . GERD (gastroesophageal reflux disease)   . Overweight   . Alcohol abuse, episodic   . 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 CARDIAC CATHETERIZATION N/A 01/02/2016   Procedure: Left Heart Cath and Coronary Angiography;  Surgeon: Marykay Lex, MD;  Location: [facility] IN[facility]AS[facility] CV LAB;  Service: Cardiovascular;  Laterality: N/A;  . CARPAL TUNNEL RELEASE Bilateral    right x 2, left x 1  . TESTICLE TORSION REDUCTION    . TOTAL HIP ARTHROPLASTY Left 01/30/2014   Procedure: LEFT TOTAL HIP ARTHROPLASTY ANTERIOR APPROACH;  Surgeon: Shelda Pal, MD;  Location: [facility] ORS;  Service: Orthopedics;  Laterality: Left;  Marland Kitchen VASECTOMY  1995  . VASECTOMY REVERSAL  1997       Home Medications    Prior to Admission medications   Medication Sig Start Date End Date Taking? Authorizing Provider  amLODipine (NORVASC) 10 MG tablet Take 1 tablet (10 mg total) by mouth every morning. 01/14/17   [name], Janit Bern, MD  LORazepam (ATIVAN) 1 MG tablet Take 1 tablet (  1 mg total) by mouth 2 (two) times daily as needed for anxiety. Patient not taking: Reported on 01/14/2017 09/15/16   Jennye Moccasin, MD  metoprolol succinate (TOPROL-XL) 100 MG 24 hr tablet Take 1 tablet (100 mg total) by mouth daily. Take with or immediately following a meal. 01/14/17   [name], Janit Bern, MD  pantoprazole (PROTONIX) 40 MG tablet Take 1 tablet (40 mg total) by mouth daily. 01/14/17   Kerman Passey, MD  venlafaxine XR (EFFEXOR-XR) 37.5 MG 24 hr capsule Take 1 capsule (37.5 mg total) by mouth daily with breakfast. 01/14/17   [name], Janit Bern, MD    Family  History Family History  Problem Relation Age of Onset  . Hypertension Mother   . Diabetes Mother   . Hypertension Father   . Stroke Father   . Cancer Neg Hx   . COPD Neg Hx   . Heart disease Neg Hx     Social History Social History  Substance Use Topics  . Smoking status: Current Some Day Smoker    Packs/day: 1.00    Years: 30.00    Types: Cigarettes  . Smokeless tobacco: Never Used  . Alcohol use Yes     Allergies   Ace inhibitors; Captopril; Enalapril; Fosinopril; Lisinopril; and Ramipril   Review of Systems Review of Systems  All other systems reviewed and are negative.    Physical Exam Updated Vital Signs There were no vitals taken for this visit.  Physical Exam  Constitutional: He is oriented to person, place, and time. He appears well-developed and well-nourished.  Appears slightly uncomfortable with intermittent grimacing and jerking  HENT:  Head: Normocephalic and atraumatic.  Mouth/Throat: Oropharynx is clear and moist.  Eyes: Pupils are equal, round, and reactive to light. Conjunctivae and EOM are normal.  Neck: Normal range of motion. Neck supple.  Cardiovascular: Normal rate, regular rhythm and intact distal pulses.   No murmur heard. Pulmonary/Chest: Effort normal and breath sounds normal. No respiratory distress. He has no wheezes. He has no rales.  Abdominal: Soft. He exhibits no distension. There is no tenderness. There is no rebound and no guarding.  Musculoskeletal: Normal range of motion. He exhibits no edema or tenderness.  Neurological: He is alert and oriented to person, place, and time.  5 strength in the right upper extremity and bilateral lower extremities. Patient can only hold both legs up at the same time per 2-3 seconds. He is having intermittent twitching every 30 seconds. He is intermittently grimacing but does not have discernible slurred speech or aphasia.  No sensory deficits.  No facial droop.    Skin: Skin is warm and dry.  Capillary refill takes less than 2 seconds. No rash noted. No erythema.  Psychiatric: He has a normal mood and affect. His behavior is normal.  Nursing note and vitals reviewed.    ED Treatments / Results  Labs (all labs ordered are listed, but only abnormal results are displayed) Labs Reviewed  APTT - Abnormal; Notable for the following:       Result Value   aPTT 23 (*)    All other components within normal limits  CBC - Abnormal; Notable for the following:    WBC 12.9 (*)    MCHC 36.5 (*)    All other components within normal limits  DIFFERENTIAL - Abnormal; Notable for the following:    Neutro Abs 11.0 (*)    All other components within normal limits  COMPREHENSIVE METABOLIC PANEL - Abnormal; Notable for  the following:    Potassium 3.4 (*)    Chloride 97 (*)    CO2 20 (*)    Glucose, Bld 148 (*)    Total Bilirubin 1.5 (*)    Anion gap 18 (*)    All other components within normal limits  I-STAT CHEM 8, ED - Abnormal; Notable for the following:    Potassium 3.3 (*)    Chloride 100 (*)    Glucose, Bld 150 (*)    Calcium, Ion 1.00 (*)    All other components within normal limits  CBG MONITORING, ED - Abnormal; Notable for the following:    Glucose-Capillary 163 (*)    All other components within normal limits  ETHANOL  PROTIME-INR  RAPID URINE DRUG SCREEN, HOSP PERFORMED  URINALYSIS, ROUTINE W REFLEX MICROSCOPIC  I-STAT TROPONIN, ED    EKG  EKG Interpretation  Date/Time:  Monday July 12 2017 08:13:46 EDT Ventricular Rate:  99 PR Interval:    QRS Duration: 104 QT Interval:  374 QTC Calculation: 480 R Axis:   72 Text Interpretation:  Sinus rhythm Borderline prolonged QT interval No significant change since last tracing Confirmed by Gwyneth Sprout ([id number]) on 07/12/2017 9:12:46 AM       Radiology Mr Maxine Glenn Head Wo Contrast  Result Date: 07/12/2017 CLINICAL DATA:  47 y/o male code stroke presentation. Slurred speech. Last seen normal 0700 hours. EXAM:  LIMITED MRI HEAD WITHOUT CONTRAST MRA HEAD WITHOUT CONTRAST TECHNIQUE: Multiplanar, multiecho pulse sequences of the brain and surrounding structures were obtained without intravenous contrast. Angiographic images of the head were obtained using MRA technique without contrast. COMPARISON:  Head CT today without contrast 0806 hours. FINDINGS: MRI HEAD FINDINGS Limited diffusion only brain MRI performed per Neurology request. No restricted diffusion or evidence of acute infarction. No ventriculomegaly, midline shift, or intracranial mass effect. MRA HEAD FINDINGS Antegrade flow in the posterior circulation with codominant distal vertebral arteries. No distal vertebral or basilar stenosis. Normal SCA and PCA origins. Tortuous left P1 segment. Both posterior communicating arteries are present. Normal bilateral PCA branches. Antegrade flow in both ICA siphons. Tortuous distal cervical right ICA. No siphon stenosis. Normal ophthalmic and posterior communicating artery origins. Patent carotid termini. Normal MCA and ACA origins. Fenestrated anterior communicating arteries suspected (normal variant). Proximal ACA is appear within normal limits. Bilateral MCA M1 segments are normal. Both MCA bifurcations are patent. Visible MCA branches are within normal limits. IMPRESSION: 1. No evidence of acute infarct.  Limited diffusion-only Brain MRI. 2.  Negative intracranial MRA. Electronically Signed   By: Odessa Fleming M.D.   On: 07/12/2017 09:08   Mr Brain Limited Wo Contrast  Result Date: 07/12/2017 CLINICAL DATA:  46 y/o male code stroke presentation. Slurred speech. Last seen normal 0700 hours. EXAM: LIMITED MRI HEAD WITHOUT CONTRAST MRA HEAD WITHOUT CONTRAST TECHNIQUE: Multiplanar, multiecho pulse sequences of the brain and surrounding structures were obtained without intravenous contrast. Angiographic images of the head were obtained using MRA technique without contrast. COMPARISON:  Head CT today without contrast 0806 hours.  FINDINGS: MRI HEAD FINDINGS Limited diffusion only brain MRI performed per Neurology request. No restricted diffusion or evidence of acute infarction. No ventriculomegaly, midline shift, or intracranial mass effect. MRA HEAD FINDINGS Antegrade flow in the posterior circulation with codominant distal vertebral arteries. No distal vertebral or basilar stenosis. Normal SCA and PCA origins. Tortuous left P1 segment. Both posterior communicating arteries are present. Normal bilateral PCA branches. Antegrade flow in both ICA siphons. Tortuous distal cervical right ICA.  No siphon stenosis. Normal ophthalmic and posterior communicating artery origins. Patent carotid termini. Normal MCA and ACA origins. Fenestrated anterior communicating arteries suspected (normal variant). Proximal ACA is appear within normal limits. Bilateral MCA M1 segments are normal. Both MCA bifurcations are patent. Visible MCA branches are within normal limits. IMPRESSION: 1. No evidence of acute infarct.  Limited diffusion-only Brain MRI. 2.  Negative intracranial MRA. Electronically Signed   By: Odessa Fleming M.D.   On: 07/12/2017 09:08   Ct Head Code Stroke Wo Contrast  Result Date: 07/12/2017 CLINICAL DATA:  Code stroke. 46 year old male with slurred speech. Last seen normal 0700 hours. EXAM: CT HEAD WITHOUT CONTRAST TECHNIQUE: Contiguous axial images were obtained from the base of the skull through the vertex without intravenous contrast. COMPARISON:  None. FINDINGS: Brain: Mild motion artifact. Cerebral volume is normal. No midline shift, ventriculomegaly, mass effect, evidence of mass lesion, intracranial hemorrhage or evidence of cortically based acute infarction. Gray-white matter differentiation is within normal limits throughout the brain. No suspicious intracranial vascular hyperdensity. Vascular: Mild Calcified atherosclerosis at the skull base. Skull: No acute osseous abnormality identified. Sinuses/Orbits: Well pneumatized overall. Mild  ethmoid mucosal thickening. Other: No acute orbit or scalp soft tissue findings. ASPECTS [facility] Stroke Program Early CT Score) - Ganglionic level infarction (caudate, lentiform nuclei, internal capsule, insula, M1-M3 cortex): 7 - Supraganglionic infarction (M4-M6 cortex): 3 Total score (0-10 with 10 being normal): 10 IMPRESSION: 1. Normal noncontrast CT appearance of the brain. 2. ASPECTS is 10. 3. The above was relayed via text pager to Dr. Wilford Corner on 07/12/2017 at 08:14 . Electronically Signed   By: Odessa Fleming M.D.   On: 07/12/2017 08:14    Procedures Procedures (including critical care time)  Medications Ordered in ED Medications - No data to display   Initial Impression / Assessment and Plan / ED Course  I have reviewed the triage vital signs and the nursing notes.  Pertinent labs & imaging results that were available during my care of the patient were reviewed by me and considered in my medical decision making (see chart for details).     Patient with a history of hypertension, former alcohol abuse and anxiety and depression presenting as a code stroke. Patient was reported as having slurred speech and generalized weakness. He is complaining of his right arm feeling very heavy but is able to lift both legs equally. He denies any pain but was found to be extremely hypertensive upon EMS arrival. Patient is supposed to be on a calcium channel blocker however will need to further evaluate her he is still taking that. Neurology was present at arrival and patient was sent to the CT scanner to evaluate for acute stroke. However patient's symptoms do not follow a specific stroke pattern.  Symptoms could be related to hypertensive urgency or if patient has started consuming alcohol again could be from hyponatremia and generalized malnutrition.Patient does not appear intoxicated currently he is awake and alert.  8:10 AM Labs today show normal Na and only mildly decreased K at 3.3.  Normal renal function,  hb and blood sugar.  9:51 AM Patient's CT and MR/MRA are negative for acute stroke or bleed or other acute pathology. On reexamination patient has tremor in bilateral upper extremities, hypertension and symptoms concerning for withdrawal. Discussed with the patient he is not drinking excessively per his report he was on a benzodiazepine at one time but unclear when he stopped taking that. Patient's symptoms did improve with Ativan. We'll keep on CIWA  here. Given home medications. We will admit for further observation and care.  Rest of labs including EtOH wnl.  10:59 AM Patient's wife returns and states that patient has been drinking a fifth of alcohol daily for the last 3 days. Patient is most likely an alcohol withdrawal. Symptoms do improve with Ativan. Will admit for further care.  Pt required more ativan for CIWA of 13.  Final Clinical Impressions(s) / ED Diagnoses   Final diagnoses:  Hypertension, unspecified type  Alcohol withdrawal syndrome with complication [facility])    New Prescriptions New Prescriptions   No medications on file     Gwyneth Sprout, MD 07/12/17 1104

## 2017-07-12 NOTE — ED Notes (Signed)
Report given to 3M RN. 

## 2017-07-12 NOTE — Code Documentation (Signed)
Patient last known well at 0700 this am.  Code stroke called in route at 0736.  Patient with "stuttering" speech, right side weakness.  Stat head CT and labs done.  Dr Wilford Corner at bedside to assess patient.  MRI done (-)  NIHSS 0 Code stroke canceled at [facility]

## 2017-07-13 LAB — HIV ANTIBODY (ROUTINE TESTING W REFLEX): HIV Screen 4th Generation wRfx: NONREACTIVE

## 2017-07-23 ENCOUNTER — Encounter: Payer: Self-pay | Admitting: *Deleted

## 2017-07-23 ENCOUNTER — Other Ambulatory Visit: Payer: Self-pay | Admitting: *Deleted

## 2017-07-23 NOTE — Patient Outreach (Signed)
[facility] Care Management  07/23/2017  [name] White 09/17/1971 [id number]   Subjective: Telephone call to patient's home number, spoke with patient, and HIPAA verified.  Discussed [facility] [employer] Transition of care follow up, patient voiced understanding, and is in agreement to follow up.   Patient states he is doing well and will call primary MD to set up follow up appointment. Patient voices understanding of medical diagnosis and treatment plan. States he does not have hyperlipidemia.    States he is accessing his [name] White benefits as needed. Patient states he does not have any education material, transition of care, care coordination, disease management, disease monitoring, transportation, community resource, or pharmacy needs at this time. States she is very appreciative of the follow up and is in agreement to receive [facility] information.     Objective: Per KPN (Knowledge Performance Now, point of care tool), [employer] iCollaborate, and chart review, patient hospitalized 07/12/17 -07/12/17 for alcohol withdrawal, and left against medical advice.  Patient also has a history of hypertension and hyperlipidemia.    Assessment: Received [employer] Transition of care referral on 07/20/17.  Transition of care follow up completed, no care management needs, and will proceed with case closure.     Plan: RNCM will send patient successful outreach letter, [facility] pamph[facility], and magnet. RNCM will send case closure due to follow up completed / no care management needs request to Iverson AlaminLaura Greeson [facility].   Cade Olberding H. Gardiner Barefootooper RN, BSN, CCM [facility] Care [facility] Telephonic CM Phone: [phone] Fax: [phone]

## 2017-08-28 ENCOUNTER — Other Ambulatory Visit: Payer: Self-pay | Admitting: Family Medicine

## 2017-08-29 NOTE — Telephone Encounter (Signed)
Please contact patient I saw him last more than 6 months ago; he was due for a return visit in May Please ask him to come in to see me in the next week or two I'll refill this time; thank you

## 2017-08-30 ENCOUNTER — Other Ambulatory Visit: Payer: Self-pay

## 2017-08-30 MED ORDER — METOPROLOL SUCCINATE ER 100 MG PO TB24: 100.0000 mg | ORAL_TABLET | Freq: Every day | ORAL | 0 refills | Status: DC

## 2017-08-30 MED ORDER — VENLAFAXINE HCL ER 37.5 MG PO CP24: 37.5000 mg | ORAL_CAPSULE | Freq: Every day | ORAL | 0 refills | Status: DC

## 2017-08-31 NOTE — Telephone Encounter (Signed)
LVM again for pt to call and schedule an appt

## 2017-09-29 ENCOUNTER — Telehealth: Payer: Self-pay | Admitting: Family Medicine

## 2017-09-29 MED ORDER — VENLAFAXINE HCL ER 37.5 MG PO CP24: 37.5000 mg | ORAL_CAPSULE | Freq: Every day | ORAL | 0 refills | Status: DC

## 2017-09-29 MED ORDER — METOPROLOL SUCCINATE ER 100 MG PO TB24: 100.0000 mg | ORAL_TABLET | Freq: Every day | ORAL | 0 refills | Status: DC

## 2017-09-29 MED ORDER — PANTOPRAZOLE SODIUM 40 MG PO TBEC: 40.0000 mg | DELAYED_RELEASE_TABLET | Freq: Every day | ORAL | 0 refills | Status: DC

## 2017-09-29 MED ORDER — AMLODIPINE BESYLATE 10 MG PO TABS: 10.0000 mg | ORAL_TABLET | Freq: Every morning | ORAL | 0 refills | Status: DC

## 2017-09-29 NOTE — Telephone Encounter (Signed)
Phone note still open Patient has requested more medicine Appt now scheduled for January Note on Rx that appt is needed for further refills I'm closing this phone note

## 2017-09-29 NOTE — Telephone Encounter (Signed)
Copied from CRM [id number]. Topic: Quick Communication - See Telephone Encounter >> Sep 29, 2017  1:43 PM Eston Mouldavis, Recia Sons B wrote: CRM for notification. See Telephone encounter for:  Refill  toprol, amlodipine, venlafaxine and protonix    pt made a med refill apt  for 1/10 09/29/17.[facility] [place] [place] - [address]

## 2017-09-29 NOTE — Telephone Encounter (Signed)
Copied from CRM [id number]. Topic: Inquiry >> Sep 29, 2017  1:47 PM Eston Mouldavis, [name] wrote: Reason for CRM: pt would like Dr Sherie DonLada to consider taking his wife as a pt.  [name]   [birth year]  [phone].   Ok to schedule pt as she is a relative? Please advise. Thanks

## 2017-09-29 NOTE — Telephone Encounter (Signed)
I sent a message to the front staff in November that patient needed an appointment It appears he was never actually reached I will provide one more refill, but will add "needs appt for further medicine" on prescription He has an appt for Jan 10th Note sent to staff about request

## 2017-10-14 ENCOUNTER — Ambulatory Visit: Payer: Managed Care, Other (non HMO) | Admitting: Family Medicine

## 2017-11-04 ENCOUNTER — Encounter: Payer: Self-pay | Admitting: Family Medicine

## 2017-11-04 ENCOUNTER — Ambulatory Visit (INDEPENDENT_AMBULATORY_CARE_PROVIDER_SITE_OTHER): Payer: Managed Care, Other (non HMO) | Admitting: Family Medicine

## 2017-11-04 VITALS — BP 126/78 | HR 71 | Temp 98.5°F | Resp 16 | Wt 190.9 lb

## 2017-11-04 DIAGNOSIS — K219 Gastro-esophageal reflux disease without esophagitis: Secondary | ICD-10-CM | POA: Diagnosis not present

## 2017-11-04 DIAGNOSIS — K760 Fatty (change of) liver, not elsewhere classified: Secondary | ICD-10-CM | POA: Diagnosis not present

## 2017-11-04 DIAGNOSIS — Z5181 Encounter for therapeutic drug level monitoring: Secondary | ICD-10-CM

## 2017-11-04 DIAGNOSIS — R5383 Other fatigue: Secondary | ICD-10-CM

## 2017-11-04 DIAGNOSIS — R7301 Impaired fasting glucose: Secondary | ICD-10-CM

## 2017-11-04 DIAGNOSIS — E612 Magnesium deficiency: Secondary | ICD-10-CM

## 2017-11-04 DIAGNOSIS — I10 Essential (primary) hypertension: Secondary | ICD-10-CM

## 2017-11-04 DIAGNOSIS — E782 Mixed hyperlipidemia: Secondary | ICD-10-CM

## 2017-11-04 MED ORDER — POTASSIUM CHLORIDE CRYS ER 20 MEQ PO TBCR: 40.0000 meq | EXTENDED_RELEASE_TABLET | Freq: Every morning | ORAL | 0 refills | Status: DC

## 2017-11-04 MED ORDER — METOPROLOL SUCCINATE ER 100 MG PO TB24: 100.0000 mg | ORAL_TABLET | Freq: Every day | ORAL | 11 refills | Status: DC

## 2017-11-04 MED ORDER — DEXLANSOPRAZOLE 30 MG PO CPDR: 30.0000 mg | DELAYED_RELEASE_CAPSULE | Freq: Every day | ORAL | 1 refills | Status: DC

## 2017-11-04 MED ORDER — POTASSIUM CHLORIDE CRYS ER 20 MEQ PO TBCR: 20.0000 meq | EXTENDED_RELEASE_TABLET | Freq: Every day | ORAL | 0 refills | Status: DC

## 2017-11-04 MED ORDER — VENLAFAXINE HCL ER 37.5 MG PO CP24: 37.5000 mg | ORAL_CAPSULE | Freq: Every day | ORAL | 11 refills | Status: DC

## 2017-11-04 MED ORDER — AMLODIPINE BESYLATE 10 MG PO TABS: 10.0000 mg | ORAL_TABLET | Freq: Every morning | ORAL | 11 refills | Status: DC

## 2017-11-04 NOTE — Assessment & Plan Note (Signed)
Well-controlled today; glad the stress is gone; refills provided

## 2017-11-04 NOTE — Assessment & Plan Note (Signed)
Stop the current med

## 2017-11-04 NOTE — Assessment & Plan Note (Signed)
Check lipids 

## 2017-11-04 NOTE — Assessment & Plan Note (Signed)
Expect this to have improved; check A1c

## 2017-11-04 NOTE — Patient Instructions (Addendum)
We'll get labs today If you have not heard anything from my staff in a week about any orders/referrals/studies from today, please contact us here to follow-up [phone] I do encourage you to quit smoking Call [phone] to sign up for smoking cessation classes You can call 1-800-QUIT-NOW to talk with a smoking cessation coach Try to follow the DASH guidelines (DASH stands for Dietary Approaches to Stop Hypertension). Try to limit the sodium in your diet to no more than 1,500mg  of sodium per day. Certainly try to not exceed 2,000 mg per day at the very most. Do not add salt when cooking or at the table.  Check the sodium amount on labels when shopping, and choose items lower in sodium when given a choice. Avoid or limit foods that already contain a lot of sodium. Eat a diet rich in fruits and vegetables and whole grains, and try to lose weight if overweight or obese

## 2017-11-04 NOTE — Progress Notes (Signed)
BP 126/78   Pulse 71   Temp 98.5 F (36.9 C) (Oral)   Resp 16   Wt 190 lb 14.4 oz (86.6 kg)   SpO2 98%   BMI 28.19 kg/m    Subjective:    Patient ID: [name] White, male    DOB: 01/06/1971, 47 y.o.   MRN: [id number]  HPI: [name] EvenerHerbert L White is a 47 y.o. male  Chief Complaint  Patient presents with  . 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 Alcohol abuse, in remission   . Anxiety   . Arthritis   . Avascular necrosis (HCC)   . Depression   . GERD (gastroesophageal reflux disease)   . History of alcohol abuse   . Hyperlipidemia   . Hypertension   . IFG (impaired fasting glucose)   . Overweight    Past Surgical History:  Procedure Laterality Date  . CARDIAC CATHETERIZATION N/A 01/02/2016   Procedure: Left Heart Cath and Coronary Angiography;  Surgeon: Marykay Lexavid W Harding, MD;  [facility];  Service: Cardiovascular;  Laterality: N/A;  . CARPAL TUNNEL RELEASE Bilateral    right x 2, left x 1  . TESTICLE TORSION REDUCTION    . TOTAL HIP ARTHROPLASTY Left 01/30/2014   Procedure: LEFT TOTAL HIP ARTHROPLASTY ANTERIOR APPROACH;   Surgeon: Shelda PalMatthew D Olin, MD;  Location: [facility];  Service: Orthopedics;  Laterality: Left;  Marland Kitchen. VASECTOMY  1995  . VASECTOMY REVERSAL  1997   Family History  Problem Relation Age of Onset  . Hypertension Mother   . Diabetes Mother   . Hypertension Father   . Stroke Father   . Cancer Neg Hx   . COPD Neg Hx   . Heart disease Neg Hx    Social History   Tobacco Use  . Smoking status: Current Every Day Smoker    Packs/day: 1.00    Years: 30.00    Pack years: 30.00    Types: Cigarettes  . Smokeless tobacco: Never Used  Substance Use Topics  . Alcohol use: No    Frequency: Never  . Drug use: No    Interim medical history since last visit reviewed. Allergies and medications reviewed  Review of Systems Per HPI unless specifically indicated above     Objective:    BP 126/78   Pulse 71   Temp 98.5 F (36.9 C) (Oral)   Resp 16   Wt 190 lb 14.4 oz (86.6 kg)   SpO2 98%   BMI 28.19 kg/m  Wt Readings from Last 3 Encounters:  11/04/17 190 lb 14.4 oz (86.6 kg)  07/12/17 176 lb 12.8 oz (80.2 kg)  01/14/17 177 lb 11.2 oz (80.6 kg)    Physical Exam  Constitutional: He appears well-developed and well-nourished.  HENT:  Head: Normocephalic and atraumatic.  Right Ear: Tympanic membrane, external ear and ear canal normal.  Left Ear: Tympanic membrane, external ear and ear canal normal.  Mouth/Throat: Oropharynx is clear and moist and mucous membranes are normal. No oropharyngeal exudate.  Eyes: No scleral icterus.  Neck: No JVD present.  Cardiovascular: Normal rate and regular rhythm.  Pulmonary/Chest: Effort normal and breath sounds normal.  Abdominal: He exhibits no distension.  Musculoskeletal: He exhibits no edema.  Neurological: He is alert.  Skin: Skin is warm. No pallor.  Psychiatric: He has a normal mood and affect.       Assessment & Plan:   Problem List Items Addressed This Visit      Cardiovascular and Mediastinum   Essential hypertension - Primary  (Chronic)    Well-controlled today; glad the stress is gone; refills provided      Relevant Medications   amLODipine (NORVASC) 10 MG tablet   metoprolol succinate (TOPROL-XL) 100 MG 24 hr tablet     Digestive   GERD (gastroesophageal reflux disease)    Stop the current med      Relevant Medications   Dexlansoprazole (DEXILANT) 30 MG capsule   Fatty liver    Check liver enzymes      Relevant Orders   COMPLETE METABOLIC PANEL WITH GFR (Completed)     Endocrine   IFG (impaired fasting glucose)    Expect this to have improved; check A1c      Relevant Orders   Hemoglobin A1c     Other   Medication monitoring encounter    Check labs      Relevant Orders   CBC with Differential/Platelet (Completed)   Hyperlipidemia (Chronic)    Check lipids      Relevant Medications   amLODipine (NORVASC) 10 MG tablet   metoprolol succinate (TOPROL-XL) 100 MG 24 hr tablet   Other Relevant Orders   Lipid panel (Completed)    Other Visit Diagnoses    Magnesium deficiency       Relevant Orders   Magnesium (Completed)   Hypophosphatemia       Relevant Orders   Phosphorus (Completed)   Other fatigue       Relevant Orders   CBC with Differential/Platelet (Completed)   B12       Follow up plan: Return in about 6 months (around 05/04/2018) for follow-up visit with Dr. Sherie Don.  An after-visit summary was printed and given to the patient at check-out.  Please see the patient instructions which may contain other information and recommendations beyond what is mentioned above in the assessment and plan.  Meds ordered this encounter  Medications  . amLODipine (NORVASC) 10 MG tablet    Sig: Take 1 tablet (10 mg total) by mouth every morning. appt needed for further refills    Dispense:  30 tablet    Refill:  11  . metoprolol succinate (TOPROL-XL) 100 MG 24 hr tablet    Sig: Take 1 tablet (100 mg total) by mouth daily. Take with or immediately following a meal. appt needed for further  refills    Dispense:  30 tablet    Refill:  11  . DISCONTD: potassium chloride SA (K-DUR,KLOR-CON) 20 MEQ tablet    Sig: Take  2 tablets (40 mEq total) by mouth every morning.    Dispense:  60 tablet    Refill:  0  . venlafaxine XR (EFFEXOR-XR) 37.5 MG 24 hr capsule    Sig: Take 1 capsule (37.5 mg total) by mouth daily with breakfast. appt needed for further refills    Dispense:  30 capsule    Refill:  11  . Dexlansoprazole (DEXILANT) 30 MG capsule    Sig: Take 1 capsule (30 mg total) by mouth daily.    Dispense:  30 capsule    Refill:  1  . potassium chloride SA (K-DUR,KLOR-CON) 20 MEQ tablet    Sig: Take 1 tablet (20 mEq total) by mouth daily.    Dispense:  30 tablet    Refill:  0    We'll use this instead    Orders Placed This Encounter  Procedures  . COMPLETE METABOLIC PANEL WITH GFR  . Hemoglobin A1c  . Lipid panel  . CBC with Differential/Platelet  . Magnesium  . Phosphorus  . B12

## 2017-11-04 NOTE — Assessment & Plan Note (Signed)
Check labs 

## 2017-11-04 NOTE — Assessment & Plan Note (Signed)
Check liver enzymes 

## 2017-11-05 ENCOUNTER — Other Ambulatory Visit: Payer: Self-pay

## 2017-11-05 ENCOUNTER — Other Ambulatory Visit: Payer: Self-pay | Admitting: Family Medicine

## 2017-11-05 DIAGNOSIS — E782 Mixed hyperlipidemia: Secondary | ICD-10-CM

## 2017-11-05 DIAGNOSIS — Z5181 Encounter for therapeutic drug level monitoring: Secondary | ICD-10-CM

## 2017-11-05 LAB — CBC WITH DIFFERENTIAL/PLATELET
Basophils Absolute: 51 cells/uL (ref 0–200)
Basophils Relative: 0.9 %
Eosinophils Absolute: 160 cells/uL (ref 15–500)
Eosinophils Relative: 2.8 %
HCT: 43.1 % (ref 38.5–50.0)
Hemoglobin: 14.8 g/dL (ref 13.2–17.1)
Lymphs Abs: 2348 cells/uL (ref 850–3900)
MCH: 29.4 pg (ref 27.0–33.0)
MCHC: 34.3 g/dL (ref 32.0–36.0)
MCV: 85.5 fL (ref 80.0–100.0)
MPV: 11.7 fL (ref 7.5–12.5)
Monocytes Relative: 8.8 %
Neutro Abs: 2639 cells/uL (ref 1500–7800)
Neutrophils Relative %: 46.3 %
Platelets: 245 10*3/uL (ref 140–400)
RBC: 5.04 10*6/uL (ref 4.20–5.80)
RDW: 13.7 % (ref 11.0–15.0)
Total Lymphocyte: 41.2 %
WBC mixed population: 502 cells/uL (ref 200–950)
WBC: 5.7 10*3/uL (ref 3.8–10.8)

## 2017-11-05 LAB — COMPLETE METABOLIC PANEL WITH GFR
AG Ratio: 1.8 (calc) (ref 1.0–2.5)
ALT: 16 U/L (ref 9–46)
AST: 16 U/L (ref 10–40)
Albumin: 4.5 g/dL (ref 3.6–5.1)
Alkaline phosphatase (APISO): 69 U/L (ref 40–115)
BUN: 19 mg/dL (ref 7–25)
CO2: 26 mmol/L (ref 20–32)
Calcium: 10 mg/dL (ref 8.6–10.3)
Chloride: 105 mmol/L (ref 98–110)
Creat: 0.82 mg/dL (ref 0.60–1.35)
GFR, Est African American: 121 mL/min/{1.73_m2} (ref >=60)
GFR, Est Non African American: 105 mL/min/{1.73_m2} (ref >=60)
Globulin: 2.5 g/dL (calc) (ref 1.9–3.7)
Glucose, Bld: 89 mg/dL (ref 65–99)
Potassium: 4.3 mmol/L (ref 3.5–5.3)
Sodium: 138 mmol/L (ref 135–146)
Total Bilirubin: 0.5 mg/dL (ref 0.2–1.2)
Total Protein: 7 g/dL (ref 6.1–8.1)

## 2017-11-05 LAB — HEMOGLOBIN A1C
Hgb A1c MFr Bld: 5.6 % of total Hgb (ref <5.7)
Mean Plasma Glucose: 114 (calc)
eAG (mmol/L): 6.3 (calc)

## 2017-11-05 LAB — LIPID PANEL
Cholesterol: 222 mg/dL — ABNORMAL HIGH (ref <200)
HDL: 41 mg/dL (ref >40)
LDL Cholesterol (Calc): 155 mg/dL (calc) — ABNORMAL HIGH
Non-HDL Cholesterol (Calc): 181 mg/dL (calc) — ABNORMAL HIGH (ref <130)
Total CHOL/HDL Ratio: 5.4 (calc) — ABNORMAL HIGH (ref <5.0)
Triglycerides: 139 mg/dL (ref <150)

## 2017-11-05 LAB — VITAMIN B12: Vitamin B-12: >2000 pg/mL — ABNORMAL HIGH (ref 200–1100)

## 2017-11-05 LAB — MAGNESIUM: Magnesium: 2 mg/dL (ref 1.5–2.5)

## 2017-11-05 LAB — PHOSPHORUS: Phosphorus: 3.6 mg/dL (ref 2.5–4.5)

## 2017-11-05 MED ORDER — ATORVASTATIN CALCIUM 20 MG PO TABS: 20.0000 mg | ORAL_TABLET | Freq: Every day | ORAL | 1 refills | Status: DC

## 2017-11-05 NOTE — Progress Notes (Signed)
Atorvastatin Rx

## 2018-01-05 ENCOUNTER — Other Ambulatory Visit: Payer: Self-pay | Admitting: Family Medicine

## 2018-01-05 NOTE — Telephone Encounter (Signed)
Please remind patient that he is due for labs; please come this week or early next I'll refill med

## 2018-01-05 NOTE — Telephone Encounter (Signed)
Called both the patient's number as well as pt's wife. Both numbers are incorrect. Will mail letter.

## 2018-03-22 ENCOUNTER — Emergency Department: Payer: Managed Care, Other (non HMO)

## 2018-03-22 ENCOUNTER — Inpatient Hospital Stay
Admission: EM | Admit: 2018-03-22 | Discharge: 2018-03-25 | DRG: 683 | Disposition: A | Discharge status: Home / Self Care | Payer: Managed Care, Other (non HMO) | Attending: Internal Medicine | Admitting: Internal Medicine

## 2018-03-22 ENCOUNTER — Inpatient Hospital Stay: Payer: Managed Care, Other (non HMO)

## 2018-03-22 ENCOUNTER — Encounter: Payer: Self-pay | Admitting: Emergency Medicine

## 2018-03-22 ENCOUNTER — Other Ambulatory Visit: Payer: Self-pay

## 2018-03-22 DIAGNOSIS — R34 Anuria and oliguria: Secondary | ICD-10-CM | POA: Diagnosis present

## 2018-03-22 DIAGNOSIS — E860 Dehydration: Secondary | ICD-10-CM | POA: Diagnosis present

## 2018-03-22 DIAGNOSIS — E872 Acidosis: Secondary | ICD-10-CM | POA: Diagnosis present

## 2018-03-22 DIAGNOSIS — R000 Tachycardia, unspecified: Secondary | ICD-10-CM | POA: Diagnosis present

## 2018-03-22 DIAGNOSIS — D72829 Elevated white blood cell count, unspecified: Secondary | ICD-10-CM | POA: Diagnosis present

## 2018-03-22 DIAGNOSIS — F329 Major depressive disorder, single episode, unspecified: Secondary | ICD-10-CM | POA: Diagnosis present

## 2018-03-22 DIAGNOSIS — R945 Abnormal results of liver function studies: Secondary | ICD-10-CM | POA: Diagnosis present

## 2018-03-22 DIAGNOSIS — R197 Diarrhea, unspecified: Secondary | ICD-10-CM | POA: Diagnosis present

## 2018-03-22 DIAGNOSIS — Z823 Family history of stroke: Secondary | ICD-10-CM | POA: Diagnosis not present

## 2018-03-22 DIAGNOSIS — Z888 Allergy status to other drugs, medicaments and biological substances status: Secondary | ICD-10-CM | POA: Diagnosis not present

## 2018-03-22 DIAGNOSIS — Z8249 Family history of ischemic heart disease and other diseases of the circulatory system: Secondary | ICD-10-CM | POA: Diagnosis not present

## 2018-03-22 DIAGNOSIS — Z7982 Long term (current) use of aspirin: Secondary | ICD-10-CM

## 2018-03-22 DIAGNOSIS — N179 Acute kidney failure, unspecified: Secondary | ICD-10-CM

## 2018-03-22 DIAGNOSIS — K219 Gastro-esophageal reflux disease without esophagitis: Secondary | ICD-10-CM | POA: Diagnosis present

## 2018-03-22 DIAGNOSIS — N3000 Acute cystitis without hematuria: Secondary | ICD-10-CM | POA: Diagnosis present

## 2018-03-22 DIAGNOSIS — N170 Acute kidney failure with tubular necrosis: Principal | ICD-10-CM | POA: Diagnosis present

## 2018-03-22 DIAGNOSIS — Z833 Family history of diabetes mellitus: Secondary | ICD-10-CM

## 2018-03-22 DIAGNOSIS — I10 Essential (primary) hypertension: Secondary | ICD-10-CM | POA: Diagnosis present

## 2018-03-22 DIAGNOSIS — Y9289 Other specified places as the place of occurrence of the external cause: Secondary | ICD-10-CM | POA: Diagnosis not present

## 2018-03-22 DIAGNOSIS — E871 Hypo-osmolality and hyponatremia: Secondary | ICD-10-CM | POA: Diagnosis present

## 2018-03-22 DIAGNOSIS — T670XXA Heatstroke and sunstroke, initial encounter: Secondary | ICD-10-CM | POA: Diagnosis present

## 2018-03-22 DIAGNOSIS — M6282 Rhabdomyolysis: Secondary | ICD-10-CM | POA: Diagnosis present

## 2018-03-22 DIAGNOSIS — F17210 Nicotine dependence, cigarettes, uncomplicated: Secondary | ICD-10-CM | POA: Diagnosis present

## 2018-03-22 DIAGNOSIS — X30XXXA Exposure to excessive natural heat, initial encounter: Secondary | ICD-10-CM

## 2018-03-22 DIAGNOSIS — Z96642 Presence of left artificial hip joint: Secondary | ICD-10-CM | POA: Diagnosis present

## 2018-03-22 DIAGNOSIS — E785 Hyperlipidemia, unspecified: Secondary | ICD-10-CM | POA: Diagnosis present

## 2018-03-22 LAB — COMPREHENSIVE METABOLIC PANEL
ALT: 32 U/L (ref 17–63)
AST: 50 U/L — ABNORMAL HIGH (ref 15–41)
Albumin: 6.1 g/dL — ABNORMAL HIGH (ref 3.5–5.0)
Alkaline Phosphatase: 107 U/L (ref 38–126)
Anion gap: 35 — ABNORMAL HIGH (ref 5–15)
BUN: 41 mg/dL — ABNORMAL HIGH (ref 6–20)
CO2: 14 mmol/L — ABNORMAL LOW (ref 22–32)
Calcium: 12 mg/dL — ABNORMAL HIGH (ref 8.9–10.3)
Chloride: 87 mmol/L — ABNORMAL LOW (ref 101–111)
Creatinine, Ser: 6.08 mg/dL — ABNORMAL HIGH (ref 0.61–1.24)
GFR calc Af Amer: 11 mL/min — ABNORMAL LOW (ref >60)
GFR calc non Af Amer: 10 mL/min — ABNORMAL LOW (ref >60)
Glucose, Bld: 223 mg/dL — ABNORMAL HIGH (ref 65–99)
Potassium: 3.9 mmol/L (ref 3.5–5.1)
Sodium: 136 mmol/L (ref 135–145)
Total Bilirubin: 1.4 mg/dL — ABNORMAL HIGH (ref 0.3–1.2)
Total Protein: 10.7 g/dL — ABNORMAL HIGH (ref 6.5–8.1)

## 2018-03-22 LAB — CBC WITH DIFFERENTIAL/PLATELET
Basophils Absolute: 0 10*3/uL (ref 0–0.1)
Basophils Relative: 0 %
Eosinophils Absolute: 0 10*3/uL (ref 0–0.7)
Eosinophils Relative: 0 %
HCT: 59.1 % — ABNORMAL HIGH (ref 40.0–52.0)
Hemoglobin: 20.2 g/dL — ABNORMAL HIGH (ref 13.0–18.0)
Lymphocytes Relative: 9 %
Lymphs Abs: 1.6 10*3/uL (ref 1.0–3.6)
MCH: 30 pg (ref 26.0–34.0)
MCHC: 34.3 g/dL (ref 32.0–36.0)
MCV: 87.7 fL (ref 80.0–100.0)
Monocytes Absolute: 1 10*3/uL (ref 0.2–1.0)
Monocytes Relative: 5 %
Neutro Abs: 16.6 10*3/uL — ABNORMAL HIGH (ref 1.4–6.5)
Neutrophils Relative %: 86 %
Platelets: 388 10*3/uL (ref 150–440)
RBC: 6.74 MIL/uL — ABNORMAL HIGH (ref 4.40–5.90)
RDW: 15.4 % — ABNORMAL HIGH (ref 11.5–14.5)
WBC: 19.2 10*3/uL — ABNORMAL HIGH (ref 3.8–10.6)

## 2018-03-22 LAB — BETA-HYDROXYBUTYRIC ACID: Beta-Hydroxybutyric Acid: 0.6 mmol/L — ABNORMAL HIGH (ref 0.05–0.27)

## 2018-03-22 LAB — T4, FREE: Free T4: 0.83 ng/dL (ref 0.82–1.77)

## 2018-03-22 LAB — ETHANOL: Alcohol, Ethyl (B): <10 mg/dL (ref <10)

## 2018-03-22 LAB — BRAIN NATRIURETIC PEPTIDE: B Natriuretic Peptide: 542 pg/mL — ABNORMAL HIGH (ref 0.0–100.0)

## 2018-03-22 LAB — TROPONIN I
Troponin I: 0.03 ng/mL (ref <0.03)
Troponin I: 0.03 ng/mL (ref <0.03)

## 2018-03-22 LAB — CK: Total CK: 1368 U/L — ABNORMAL HIGH (ref 49–397)

## 2018-03-22 LAB — TSH: TSH: 2.567 u[IU]/mL (ref 0.350–4.500)

## 2018-03-22 LAB — LIPASE, BLOOD: Lipase: 68 U/L — ABNORMAL HIGH (ref 11–51)

## 2018-03-22 MED ORDER — PHENOBARBITAL SODIUM 65 MG/ML IJ SOLN
130.0000 mg | Freq: Once | INTRAMUSCULAR | Status: AC
  Administered 2018-03-22: 130 mg via INTRAVENOUS

## 2018-03-22 MED ORDER — DOCUSATE SODIUM 100 MG PO CAPS
100.0000 mg | ORAL_CAPSULE | Freq: Two times a day (BID) | ORAL | Status: DC
  Filled 2018-03-22: qty 1

## 2018-03-22 MED ORDER — ONDANSETRON HCL 4 MG/2ML IJ SOLN: 4.0000 mg | Freq: Four times a day (QID) | INTRAMUSCULAR | Status: DC | PRN

## 2018-03-22 MED ORDER — ENOXAPARIN SODIUM 30 MG/0.3ML ~~LOC~~ SOLN: 30.0000 mg | SUBCUTANEOUS | Status: DC

## 2018-03-22 MED ORDER — SODIUM CHLORIDE 0.9 % IV BOLUS
1000.0000 mL | Freq: Once | INTRAVENOUS | Status: AC
  Administered 2018-03-22: 1000 mL via INTRAVENOUS

## 2018-03-22 MED ORDER — LORAZEPAM 2 MG/ML IJ SOLN
2.0000 mg | Freq: Once | INTRAMUSCULAR | Status: AC
  Administered 2018-03-22: 2 mg via INTRAVENOUS
  Filled 2018-03-22: qty 1

## 2018-03-22 MED ORDER — ACETAMINOPHEN 650 MG RE SUPP: 650.0000 mg | Freq: Four times a day (QID) | RECTAL | Status: DC | PRN

## 2018-03-22 MED ORDER — SODIUM CHLORIDE 0.9 % IV SOLN
INTRAVENOUS | Status: DC
  Administered 2018-03-22 – 2018-03-23 (×2): via INTRAVENOUS

## 2018-03-22 MED ORDER — ONDANSETRON HCL 4 MG PO TABS: 4.0000 mg | ORAL_TABLET | Freq: Four times a day (QID) | ORAL | Status: DC | PRN

## 2018-03-22 MED ORDER — ASPIRIN EC 81 MG PO TBEC
81.0000 mg | DELAYED_RELEASE_TABLET | Freq: Every day | ORAL | Status: DC
  Administered 2018-03-23: 81 mg via ORAL
  Filled 2018-03-22: qty 1

## 2018-03-22 MED ORDER — ACETAMINOPHEN 325 MG PO TABS: 650.0000 mg | ORAL_TABLET | Freq: Four times a day (QID) | ORAL | Status: DC | PRN

## 2018-03-22 MED ORDER — VENLAFAXINE HCL ER 37.5 MG PO CP24
37.5000 mg | ORAL_CAPSULE | Freq: Every day | ORAL | Status: DC
  Administered 2018-03-23 – 2018-03-25 (×3): 37.5 mg via ORAL
  Filled 2018-03-22 (×3): qty 1

## 2018-03-22 NOTE — ED Triage Notes (Addendum)
Pt presents to ED via POV with c/o generalized cramping, worse in his abdomen, SHOB, and weakness, pt states symptoms started at approx 0500 this morning. Pt also c/o N/V since 0500 this morning. Pt also reports 4 episodes of syncope today.

## 2018-03-22 NOTE — H&P (Signed)
[facility] [place] at [facility]   PATIENT NAME: [name] White    MR#:  [id number]  DATE OF BIRTH:  [birth year]  DATE OF ADMISSION:  03/22/2018  PRIMARY CARE PHYSICIAN: Kerman Passey, MD   REQUESTING/REFERRING PHYSICIAN: Rifenbark  CHIEF COMPLAINT:   Muscle cramps HISTORY OF PRESENT ILLNESS:  [name] White  is a 47 y.o. male with a known history of hypertension, hyperlipidemia, GERD, alcohol abuse is presented to the ED with a chief complaint of severe muscle cramps and tiredness.  According to the wife at bedside patient was working outside in the hot sun  and by the time he came home he was drenching sweats.  Today morning he woke up with severe muscle cramps and not feeling well.  Patient is brought into the ED.  CKs at 1368.  Creatinine at 6.0 which was normal in  January Electrodes are normal and patient was altered during my examination as he has received Ativan.  Wife at bedside  PAST MEDICAL HISTORY:   Past Medical History:  Diagnosis Date  . Alcohol abuse, in remission   . Anxiety   . Arthritis   . Avascular necrosis (HCC)   . Depression   . GERD (gastroesophageal reflux disease)   . History of alcohol abuse   . Hyperlipidemia   . Hypertension   . IFG (impaired fasting glucose)   . Overweight     PAST SURGICAL HISTOIRY:   Past Surgical History:  Procedure Laterality Date  . CARDIAC CATHETERIZATION N/A 01/02/2016   Procedure: Left Heart Cath and Coronary Angiography;  Surgeon: Marykay Lex, MD;  Location: [facility]VASIVE CV LAB;  Service: Cardiovascular;  Laterality: N/A;  . CARPAL TUNNEL RELEASE Bilateral    right x 2, left x 1  . TESTICLE TORSION REDUCTION    . TOTAL HIP ARTHROPLASTY Left 01/30/2014   Procedure: LEFT TOTAL HIP ARTHROPLASTY ANTERIOR APPROACH;  Surgeon: Shelda Pal, MD;  Location: [facility];  Service: Orthopedics;  Laterality: Left;  Marland Kitchen VASECTOMY  1995  . VASECTOMY REVERSAL  1997    SOCIAL HISTORY:   Social  History   Tobacco Use  . Smoking status: Current Every Day Smoker    Packs/day: 1.00    Years: 30.00    Pack years: 30.00    Types: Cigarettes  . Smokeless tobacco: Never Used  Substance Use Topics  . Alcohol use: No    Frequency: Never    FAMILY HISTORY:   Family History  Problem Relation Age of Onset  . Hypertension Mother   . Diabetes Mother   . Hypertension Father   . Stroke Father   . Cancer Neg Hx   . COPD Neg Hx   . Heart disease Neg Hx     DRUG ALLERGIES:   Allergies  Allergen Reactions  . Ace Inhibitors Anaphylaxis  . Captopril Anaphylaxis  . Enalapril Anaphylaxis  . Fosinopril Anaphylaxis  . Lisinopril Anaphylaxis  . Ramipril Anaphylaxis    REVIEW OF SYSTEMS:  Review of system unobtainable as the patient is lethargic from Ativan  MEDICATIONS AT HOME:   Prior to Admission medications   Medication Sig Start Date End Date Taking? Authorizing Provider  amLODipine (NORVASC) 10 MG tablet Take 1 tablet (10 mg total) by mouth every morning. appt needed for further refills 11/04/17  Yes [name], Janit Bern, MD  aspirin EC 81 MG tablet Take 81 mg by mouth daily.   Yes [provider]  atorvastatin (LIPITOR) 20 MG tablet Take  1 tablet (20 mg total) by mouth at bedtime. 11/05/17  Yes [name], Janit BernMelinda P, MD  metoprolol succinate (TOPROL-XL) 100 MG 24 hr tablet Take 1 tablet (100 mg total) by mouth daily. Take with or immediately following a meal. appt needed for further refills 11/04/17  Yes [name], Janit BernMelinda P, MD  Multiple Vitamin (MULTIVITAMIN WITH MINERALS) TABS tablet Take 1 tablet by mouth daily.   Yes [provider]  potassium chloride SA (K-DUR,KLOR-CON) 20 MEQ tablet Take 1 tablet (20 mEq total) by mouth daily. Patient taking differently: Take 40 mEq by mouth daily.  11/04/17  Yes [name], Janit BernMelinda P, MD  venlafaxine XR (EFFEXOR-XR) 37.5 MG 24 hr capsule Take 1 capsule (37.5 mg total) by mouth daily with breakfast. appt needed for further refills 11/04/17  Yes  [name], Janit BernMelinda P, MD  DEXILANT 30 MG capsule Take 1 capsule (30 mg total) by mouth daily. Patient not taking: Reported on 03/22/2018 01/05/18   Kerman PasseyLada, [name], MD      VITAL SIGNS:  Blood pressure 106/72, pulse (!) 115, temperature 99.4 F (37.4 C), temperature source Rectal, resp. rate 18, height 5\' 9"  (1.753 m), weight 86.2 kg (190 lb), SpO2 100 %.  PHYSICAL EXAMINATION:  GENERAL:  47 y.o.-year-old patient lying in the bed with no acute distress.  EYES: Pupils equal, round, reactive to light and accommodation. No scleral icterus. Extraocular muscles intact.   HEENT: Head atraumatic, normocephalic. Oropharynx and nasopharynx clear.  Dry mucous membranes NECK:  Supple, no jugular venous distention. No thyroid enlargement, no tenderness.  LUNGS: Normal breath sounds bilaterally, no wheezing, rales,rhonchi or crepitation. No use of accessory muscles of respiration.  CARDIOVASCULAR:  tachycardic no murmurs, rubs, or gallops.  ABDOMEN: Soft, nontender, nondistended. Bowel sounds present.   EXTREMITIES: No pedal edema, cyanosis, or clubbing.  NEUROLOGIC: Lethargic but arousable not answering any questions PSYCHIATRIC: The patient is  delirious SKIN: No obvious rash, lesion, or ulcer.   LABORATORY PANEL:   CBC Recent Labs  Lab 03/22/18 1730  WBC 19.2*  HGB 20.2*  HCT 59.1*  PLT 388   ------------------------------------------------------------------------------------------------------------------  Chemistries  Recent Labs  Lab 03/22/18 1730  NA 136  K 3.9  CL 87*  CO2 14*  GLUCOSE 223*  BUN 41*  CREATININE 6.08*  CALCIUM 12.0*  AST 50*  ALT 32  ALKPHOS 107  BILITOT 1.4*   ------------------------------------------------------------------------------------------------------------------  Cardiac Enzymes Recent Labs  Lab 03/22/18 1730  TROPONINI 0.03*    ------------------------------------------------------------------------------------------------------------------  RADIOLOGY:  Dg Chest Port 1 View  Result Date: 03/22/2018 CLINICAL DATA:  Generalize cramping which shortness-of-breath and weakness. Symptoms beginning this morning. EXAM: PORTABLE CHEST 1 VIEW COMPARISON:  07/12/2017 FINDINGS: The heart size and mediastinal contours are within normal limits. Both lungs are clear. The visualized skeletal structures are unremarkable. IMPRESSION: No active disease. Electronically Signed   By: Elberta Fortisaniel  Boyle M.D.   On: 03/22/2018 17:51    EKG:   Orders placed or performed during the hospital encounter of 03/22/18  . EKG 12-Lead  . EKG 12-Lead  . ED EKG  . ED EKG    IMPRESSION AND PLAN:   [name] White  is a 47 y.o. male with a known history of hypertension, hyperlipidemia, GERD, alcohol abuse is presented to the ED with a chief complaint of severe muscle cramps and tiredness.  According to the wife at bedside patient was working outside in the hot sun  and by the time he came home he was drenching sweats.  Today morning he woke up  with severe muscle cramps and not feeling well.  Patient is brought into the ED.  CKs at 1368.  Creatinine at 6.0  #Acute kidney injury-probably from severe dehydration  admit to MedSurg unit Aggressive hydration with IV fluids Renal ultrasound Foley catheter to  monitor urine output Nephrology consult is placed Potassium is normal at this time no urgent dialysis needed Urinalysis ordered   #Acute rhabdomyolysis Aggressive hydration with IV fluids, urinalysis is pending Repeat CK in a.m.  #Leukocytosis with tachycardia but patient is afebrile-from severe dehydration from heat stroke Hydrate with IV fluids Blood cultures and urine culture not considering antibiotics at this time as patient does not seem to be septic but severely dehydrated  #Hypertension-blood pressure is soft Hold home medications  Norvasc and metoprolol  #Hyperlipidemia-check fasting lipid panel and holding statin for now  #Abnormal LFTs with history of alcohol abuse Repeat LFTs, amylase and lipase in a.m.   DVT prophylaxis with Lovenox subcu   All the records are reviewed and case discussed with ED provider. Management plans discussed with the patient, family and they are in agreement.  CODE STATUS: fc   TOTAL TIME TAKING CARE OF THIS PATIENT: 42  minutes.   Note: This dictation was prepared with Dragon dictation along with smaller phrase technology. Any transcriptional errors that result from this process are unintentional.  Ramonita Lab M.D on 03/22/2018 at 9:01 PM  Between 7am to 6pm - Pager - [phone]  After 6pm go to [email or web address] - password [email or web address]  [place] [facility]  Office  [phone]  CC: Primary care physician; Kerman Passey, MD

## 2018-03-22 NOTE — ED Notes (Signed)
Unable to obtain temperature, multiple attempts.

## 2018-03-22 NOTE — ED Provider Notes (Signed)
[facility] Emergency Department Provider Note  ____________________________________________   First MD Initiated Contact with Patient 03/22/18 1722     (approximate)  I have reviewed the triage vital signs and the nursing notes.   HISTORY  Chief Complaint Abdominal Cramping; Shortness of Breath; and Weakness  Level 5 exemption history limited by the patient's clinical condition  HPI [name] White is a 47 y.o. male who self presents to the emergency department with abdominal cramping, nausea, vomiting, shortness of breath, fatigue, and muscle cramps that began earlier today.  He has a long-standing history of alcohol abuse although he denies recent use.  He also has a past medical history of "low potassium".  Yesterday he was working outside in the hot sun and was only drinking water and "not any Gatorade".  He has severe diffuse body cramping and spasms.  Nothing seems to make them better or worse.  His girlfriend at bedside says the patient drinks alcohol heavily 4 days a week but does not know if he is drinking when she is unaware.  The patient also says he has not urinated in more than 36 hours.  Past Medical History:  Diagnosis Date  . Alcohol abuse, in remission   . Anxiety   . Arthritis   . Avascular necrosis (HCC)   . Depression   . GERD (gastroesophageal reflux disease)   . History of alcohol abuse   . Hyperlipidemia   . Hypertension   . IFG (impaired fasting glucose)   . Overweight     Patient Active Problem List   Diagnosis Date Noted  . AKI (acute kidney injury) (HCC) 03/22/2018  . Medication monitoring encounter 01/14/2017  . Abnormal nuclear stress test 12/31/2015  . Snoring 11/06/2015  . Elevated liver function tests 05/19/2015  . Fatty liver 05/19/2015  . Elevated serum GGT level 05/17/2015  . IFG (impaired fasting glucose)   . Hyperlipidemia   . Essential hypertension   . Depression   . Anxiety   . GERD (gastroesophageal  reflux disease)   . Overweight   . Alcohol abuse, episodic   . 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 CARDIAC CATHETERIZATION N/A 01/02/2016   Procedure: Left Heart Cath and Coronary Angiography;  Surgeon: Marykay Lex, MD;  Location: [facility] INVASIVE CV LAB;  Service: Cardiovascular;  Laterality: N/A;  . CARPAL TUNNEL RELEASE Bilateral    right x 2, left x 1  . TESTICLE TORSION REDUCTION    . TOTAL HIP ARTHROPLASTY Left 01/30/2014   Procedure: LEFT TOTAL HIP ARTHROPLASTY ANTERIOR APPROACH;  Surgeon: Shelda Pal, MD;  Location: [facility] ORS;  Service: Orthopedics;  Laterality: Left;  Marland Kitchen VASECTOMY  1995  . VASECTOMY REVERSAL  1997    Prior to Admission medications   Medication Sig Start Date End Date Taking? Authorizing Provider  amLODipine (NORVASC) 10 MG tablet Take 1 tablet (10 mg total) by mouth every morning. appt needed for further refills 11/04/17  Yes [name], Janit Bern, MD  aspirin EC 81 MG tablet Take 81 mg by mouth daily.   Yes [provider]  atorvastatin (LIPITOR) 20 MG tablet Take 1 tablet (20 mg total) by mouth at bedtime. 11/05/17  Yes [name], Janit Bern, MD  metoprolol succinate (TOPROL-XL) 100 MG 24 hr tablet Take 1 tablet (100 mg total) by mouth daily. Take with or immediately following a meal. appt needed for further refills 11/04/17  Yes [name], Kingston  P, MD  Multiple Vitamin (MULTIVITAMIN WITH MINERALS) TABS tablet Take 1 tablet by mouth daily.   Yes [provider]  potassium chloride SA (K-DUR,KLOR-CON) 20 MEQ tablet Take 1 tablet (20 mEq total) by mouth daily. Patient taking differently: Take 40 mEq by mouth daily.  11/04/17  Yes [name], Janit BernMelinda P, MD  venlafaxine XR (EFFEXOR-XR) 37.5 MG 24 hr capsule Take 1 capsule (37.5 mg total) by mouth daily with breakfast. appt needed for further refills 11/04/17  Yes [name], Janit BernMelinda P, MD  DEXILANT 30 MG capsule Take 1 capsule (30 mg total) by mouth  daily. Patient not taking: Reported on 03/22/2018 01/05/18   Kerman PasseyLada, [name], MD    Allergies Ace inhibitors; Captopril; Enalapril; Fosinopril; Lisinopril; and Ramipril  Family History  Problem Relation Age of Onset  . Hypertension Mother   . Diabetes Mother   . Hypertension Father   . Stroke Father   . Cancer Neg Hx   . COPD Neg Hx   . Heart disease Neg Hx     Social History Social History   Tobacco Use  . Smoking status: Current Every Day Smoker    Packs/day: 1.00    Years: 30.00    Pack years: 30.00    Types: Cigarettes  . Smokeless tobacco: Never Used  Substance Use Topics  . Alcohol use: No    Frequency: Never  . Drug use: No    Review of Systems Level 5 exemption history limited by the patient's clinical condition  ____________________________________________   PHYSICAL EXAM:  VITAL SIGNS: ED Triage Vitals  Enc Vitals Group     BP 03/22/18 1714 91/71     Pulse Rate 03/22/18 1714 (!) 115     Resp 03/22/18 1714 (!) 26     Temp --      Temp src --      SpO2 03/22/18 1714 100 %     Weight 03/22/18 1707 190 lb (86.2 kg)     Height 03/22/18 1707 5\' 9"  (1.753 m)     Head Circumference --      Peak Flow --      Pain Score 03/22/18 1707 10     Pain Loc --      Pain Edu? --      Excl. in GC? --     Constitutional: Diaphoretic, tremulous, appears critically ill Eyes: PERRL EOMI. midrange and brisk Head: Atraumatic. Nose: No congestion/rhinnorhea. Mouth/Throat: No trismus small tongue fasciculations Neck: No stridor.  No meningismus Cardiovascular: Tachycardic rate, regular rhythm. Grossly normal heart sounds.  Good peripheral circulation. Respiratory: Increased respiratory effort.  No retractions. Lungs CTAB and moving good air Gastrointestinal: Soft nontender Musculoskeletal: No lower extremity edema no hand tremors Neurologic: Moves all 4 Skin: Diaphoretic Psychiatric: Anxious and confused    ____________________________________________    DIFFERENTIAL includes but not limited to  Alcohol withdrawal, delirium tremens, dehydration, rhabdomyolysis, sepsis ____________________________________________   LABS (all labs ordered are listed, but only abnormal results are displayed)  Labs Reviewed  COMPREHENSIVE METABOLIC PANEL - Abnormal; Notable for the following components:      Result Value   Chloride 87 (*)    CO2 14 (*)    Glucose, Bld 223 (*)    BUN 41 (*)    Creatinine, Ser 6.08 (*)    Calcium 12.0 (*)    Total Protein 10.7 (*)    Albumin 6.1 (*)    AST 50 (*)    Total Bilirubin 1.4 (*)    GFR calc  non Af Amer 10 (*)    GFR calc Af Amer 11 (*)    Anion gap 35 (*)    All other components within normal limits  TROPONIN I - Abnormal; Notable for the following components:   Troponin I 0.03 (*)    All other components within normal limits  BRAIN NATRIURETIC PEPTIDE - Abnormal; Notable for the following components:   B Natriuretic Peptide 542.0 (*)    All other components within normal limits  CBC WITH DIFFERENTIAL/PLATELET - Abnormal; Notable for the following components:   WBC 19.2 (*)    RBC 6.74 (*)    Hemoglobin 20.2 (*)    HCT 59.1 (*)    RDW 15.4 (*)    Neutro Abs 16.6 (*)    All other components within normal limits  LIPASE, BLOOD - Abnormal; Notable for the following components:   Lipase 68 (*)    All other components within normal limits  BETA-HYDROXYBUTYRIC ACID - Abnormal; Notable for the following components:   Beta-Hydroxybutyric Acid 0.60 (*)    All other components within normal limits  CK - Abnormal; Notable for the following components:   Total CK 1,368 (*)    All other components within normal limits  ETHANOL  TSH  T4, FREE  URINALYSIS, COMPLETE (UACMP) WITH MICROSCOPIC  URINE DRUG SCREEN, QUALITATIVE ([facility] ONLY)    Lab work reviewed by me with a number of abnormalities most notably a markedly elevated creatinine and hemoglobin consistent with dehydration.  Elevated CK likely  secondary to rhabdomyolysis __________________________________________  EKG  ED ECG REPORT I, Merrily Brittle, the attending physician, personally viewed and interpreted this ECG.  Date: 03/22/2018 EKG Time:  Rate: 116 Rhythm: Sinus tachycardia QRS Axis: Rightward axis Intervals: normal ST/T Wave abnormalities: Repolarization abnormalities Narrative Interpretation: no evidence of acute ischemia  ____________________________________________  RADIOLOGY  Chest x-ray reviewed by me with no acute disease ____________________________________________   PROCEDURES  Procedure(s) performed: no  .Critical Care Performed by: Merrily Brittle, MD Authorized by: Merrily Brittle, MD   Critical care provider statement:    Critical care time (minutes):  35   Critical care time was exclusive of:  Separately billable procedures and treating other patients   Critical care was necessary to treat or prevent imminent or life-threatening deterioration of the following conditions:  Renal failure   Critical care was time spent personally by me on the following activities:  Development of treatment plan with patient or surrogate, discussions with consultants, evaluation of patient's response to treatment, examination of patient, obtaining history from patient or surrogate, ordering and performing treatments and interventions, ordering and review of laboratory studies, ordering and review of radiographic studies, pulse oximetry, re-evaluation of patient's condition and review of old charts    Critical Care performed: Yes  Observation: no ____________________________________________   INITIAL IMPRESSION / ASSESSMENT AND PLAN / ED COURSE  Pertinent labs & imaging results that were available during my care of the patient were reviewed by me and considered in my medical decision making (see chart for details).  On arrival the patient is tachycardic diaphoretic confused and uncomfortable appearing.   We are unable to obtain oral temperature so rectal temperature obtained which was normal.  Differential is broad but given his long history of alcohol abuse and multiple admissions for alcohol withdrawal and I am concerned about early delirium tremens.  2 mg of Ativan and 130-minute milligrams of phenobarbital given with some improvement in the patient's symptoms.  Fluids for now given the likely dehydration.  He does arrive hypotensive but is responding to fluid.  The patient's blood work is back with a number of metabolic abnormalities most notably renal function which is markedly elevated.  This is likely secondary to rhabdomyolysis and dehydration.  At this point patient requires inpatient admission for aggressive fluid resuscitation and management of his multiple electrolyte abnormalities.  The patient and family verbalized understanding and agreement with the plan.  I then discussed with the hospitalist who has graciously agreed to admit the patient to her service.      ____________________________________________   FINAL CLINICAL IMPRESSION(S) / ED DIAGNOSES  Final diagnoses:  Dehydration  Acute kidney injury (HCC)  Non-traumatic rhabdomyolysis      NEW MEDICATIONS STARTED DURING THIS VISIT:  New Prescriptions   No medications on file     Note:  This document was prepared using Dragon voice recognition software and may include unintentional dictation errors.     Merrily Brittle, MD 03/22/18 2049

## 2018-03-22 NOTE — ED Notes (Signed)
This RN and a medic attempted to collect blood from pt. Lab contacted and will send phlebotomist to attempt blood collection.

## 2018-03-23 ENCOUNTER — Encounter
Admission: EM | Disposition: A | Discharge status: Home / Self Care | Payer: Self-pay | Source: Home / Self Care | Attending: Internal Medicine

## 2018-03-23 DIAGNOSIS — M6282 Rhabdomyolysis: Secondary | ICD-10-CM

## 2018-03-23 DIAGNOSIS — N179 Acute kidney failure, unspecified: Secondary | ICD-10-CM

## 2018-03-23 HISTORY — PX: DIALYSIS/PERMA CATHETER INSERTION: CATH118288

## 2018-03-23 LAB — URINALYSIS, COMPLETE (UACMP) WITH MICROSCOPIC
Bilirubin Urine: NEGATIVE
Glucose, UA: NEGATIVE mg/dL
Ketones, ur: NEGATIVE mg/dL
Leukocytes, UA: NEGATIVE
Nitrite: NEGATIVE
Protein, ur: 100 mg/dL — AB
Specific Gravity, Urine: 1.018 (ref 1.005–1.030)
Squamous Epithelial / LPF: NONE SEEN (ref 0–5)
pH: 5 (ref 5.0–8.0)

## 2018-03-23 LAB — CBC
HCT: 45.4 % (ref 40.0–52.0)
Hemoglobin: 15.7 g/dL (ref 13.0–18.0)
MCH: 30 pg (ref 26.0–34.0)
MCHC: 34.7 g/dL (ref 32.0–36.0)
MCV: 86.6 fL (ref 80.0–100.0)
Platelets: 275 10*3/uL (ref 150–440)
RBC: 5.24 MIL/uL (ref 4.40–5.90)
RDW: 15.4 % — ABNORMAL HIGH (ref 11.5–14.5)
WBC: 18.4 10*3/uL — ABNORMAL HIGH (ref 3.8–10.6)

## 2018-03-23 LAB — COMPREHENSIVE METABOLIC PANEL
ALT: 19 U/L (ref 17–63)
AST: 30 U/L (ref 15–41)
Albumin: 3.9 g/dL (ref 3.5–5.0)
Alkaline Phosphatase: 68 U/L (ref 38–126)
Anion gap: 17 — ABNORMAL HIGH (ref 5–15)
BUN: 51 mg/dL — ABNORMAL HIGH (ref 6–20)
CO2: 16 mmol/L — ABNORMAL LOW (ref 22–32)
Calcium: 8.4 mg/dL — ABNORMAL LOW (ref 8.9–10.3)
Chloride: 99 mmol/L — ABNORMAL LOW (ref 101–111)
Creatinine, Ser: 6.55 mg/dL — ABNORMAL HIGH (ref 0.61–1.24)
GFR calc Af Amer: 10 mL/min — ABNORMAL LOW (ref >60)
GFR calc non Af Amer: 9 mL/min — ABNORMAL LOW (ref >60)
Glucose, Bld: 122 mg/dL — ABNORMAL HIGH (ref 65–99)
Potassium: 3.8 mmol/L (ref 3.5–5.1)
Sodium: 132 mmol/L — ABNORMAL LOW (ref 135–145)
Total Bilirubin: 0.9 mg/dL (ref 0.3–1.2)
Total Protein: 7.1 g/dL (ref 6.5–8.1)

## 2018-03-23 LAB — URINE DRUG SCREEN, QUALITATIVE (ARMC ONLY)
Amphetamines, Ur Screen: NOT DETECTED
Benzodiazepine, Ur Scrn: NOT DETECTED
Cannabinoid 50 Ng, Ur ~~LOC~~: NOT DETECTED
Cocaine Metabolite,Ur ~~LOC~~: NOT DETECTED
MDMA (Ecstasy)Ur Screen: NOT DETECTED
Methadone Scn, Ur: NOT DETECTED
Opiate, Ur Screen: NOT DETECTED
Phencyclidine (PCP) Ur S: NOT DETECTED
Tricyclic, Ur Screen: NOT DETECTED

## 2018-03-23 LAB — HEMOGLOBIN A1C
Hgb A1c MFr Bld: 5.8 % — ABNORMAL HIGH (ref 4.8–5.6)
Mean Plasma Glucose: 119.76 mg/dL

## 2018-03-23 LAB — AMYLASE: Amylase: 107 U/L — ABNORMAL HIGH (ref 28–100)

## 2018-03-23 LAB — LIPASE, BLOOD: Lipase: 30 U/L (ref 11–51)

## 2018-03-23 LAB — PROTEIN / CREATININE RATIO, URINE
Creatinine, Urine: 396 mg/dL
Protein Creatinine Ratio: 0.56 mg/mg{Cre} — ABNORMAL HIGH (ref 0.00–0.15)
Total Protein, Urine: 220 mg/dL

## 2018-03-23 LAB — CK: Total CK: 899 U/L — ABNORMAL HIGH (ref 49–397)

## 2018-03-23 LAB — TROPONIN I
Troponin I: 0.04 ng/mL (ref <0.03)
Troponin I: <0.03 ng/mL (ref <0.03)

## 2018-03-23 SURGERY — DIALYSIS/PERMA CATHETER INSERTION
Anesthesia: LOCAL

## 2018-03-23 MED ORDER — HEPARIN SODIUM (PORCINE) 5000 UNIT/ML IJ SOLN
5000.0000 [IU] | Freq: Three times a day (TID) | INTRAMUSCULAR | Status: DC
  Administered 2018-03-23: 5000 [IU] via SUBCUTANEOUS
  Filled 2018-03-23 (×2): qty 1

## 2018-03-23 MED ORDER — LIDOCAINE HCL (PF) 1 % IJ SOLN
INTRAMUSCULAR | Status: DC | PRN
  Administered 2018-03-23: 5 mL via INTRADERMAL

## 2018-03-23 MED ORDER — CHLORHEXIDINE GLUCONATE CLOTH 2 % EX PADS
6.0000 | MEDICATED_PAD | Freq: Every day | CUTANEOUS | Status: DC
  Administered 2018-03-23 – 2018-03-25 (×2): 6 via TOPICAL

## 2018-03-23 SURGICAL SUPPLY — 1 items: KIT DIALYSIS CATH TRI 30X13 (CATHETERS) ×1 IMPLANT

## 2018-03-23 NOTE — Op Note (Signed)
  OPERATIVE NOTE   PROCEDURE: 1. Insertion of temporary dialysis catheter catheter right femoral approach.  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  ANESTHESIA: 1% lidocaine local infiltration  ESTIMATED BLOOD LOSS: Minimal cc  INDICATIONS:   [name] White is a 47 y.o. male who presents with acute renal failure secondary to rhabdomyolysis.  He will require hemodialysis and therefore temporary catheter is being placed.  Risks and benefits were reviewed patient has agreed to proceed..  DESCRIPTION: After obtaining full informed written consent, the patient was positioned supine. The right groin was prepped and draped in a sterile fashion. Ultrasound was placed in a sterile sleeve. Ultrasound was utilized to identify the right common femoral vein which is noted to be echolucent and compressible indicating patency. Images recorded for the permanent record. Under real-time visualization a Seldinger needle is inserted into the vein and the guidewires advanced without difficulty. Small counterincision was made at the wire insertion site. Dilator is passed over the wire and the temporary dialysis catheter catheter is fed over the wire without difficulty.  All lumens aspirate and flush easily and are packed with heparin saline. Catheter secured to the skin of the right thigh with 2-0 silk. A sterile dressing is applied with Biopatch.  COMPLICATIONS: None  CONDITION: Unchanged  Levora DredgeGregory Benino White Office:  [phone] 03/23/2018, 6:51 PM

## 2018-03-23 NOTE — Progress Notes (Signed)
Pt unable to void on admit. MD placed order for urinary cath, however, Pt refused.  Will try again on next shift.

## 2018-03-23 NOTE — Consult Note (Signed)
[facility] CONSULT NOTE    Date: 03/23/2018                  Patient Name:  [name] White  MRN: [id number]  DOB: 12/02/1970  Age / Sex: 47 y.o., male         PCP: Arnetha Courser, MD                 Service Requesting Consult: Hospitalist                 Reason for Consult: Severe acute renal failure            History of Present Illness: Patient is a 47 y.o. male with a PMHx of prior history of alcohol abuse, anxiety, depression, GERD, hypertension, hyperlipidemia, who was admitted to [facility] on 03/22/2018 for evaluation of muscle cramping.  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  On Monday he had outdoor training.  It was quite hot and patient states that he was sweating profusely.  Later on Monday he also developed diarrhea and had multiple bowel movements on Monday as well as Tuesday.  He was attempting to maintain p.o. fluid intake but feels that he was losing fluid at a faster rate than he was taking it in.  He was found to have mild rhabdomyolysis with a CK of 1368.  However he was found to have severe renal failure with a creatinine of 6.08 upon admission.  Creatinine now is up to 6.55.  He also has metabolic acidosis with a serum bicarbonate of 16.  CK has come down to 899.  He did take 1 dose of Aleve on Monday but has not had any further NSAID intake since then.  Renal ultrasound was negative for hydronephrosis.  Patient has not made very much urine at all since Monday.   Medications: Outpatient medications: Medications Prior to Admission  Medication Sig Dispense Refill Last Dose  . amLODipine (NORVASC) 10 MG tablet Take 1 tablet (10 mg total) by mouth every morning. appt needed for further refills 30 tablet 11   . aspirin EC 81 MG tablet Take 81 mg by mouth daily.   Taking  . atorvastatin (LIPITOR) 20 MG tablet Take 1 tablet (20 mg total) by mouth at bedtime. 30 tablet 1   . metoprolol succinate (TOPROL-XL) 100 MG 24 hr tablet Take  1 tablet (100 mg total) by mouth daily. Take with or immediately following a meal. appt needed for further refills 30 tablet 11   . Multiple Vitamin (MULTIVITAMIN WITH MINERALS) TABS tablet Take 1 tablet by mouth daily.   Not Taking  . potassium chloride SA (K-DUR,KLOR-CON) 20 MEQ tablet Take 1 tablet (20 mEq total) by mouth daily. (Patient taking differently: Take 40 mEq by mouth daily. ) 30 tablet 0   . venlafaxine XR (EFFEXOR-XR) 37.5 MG 24 hr capsule Take 1 capsule (37.5 mg total) by mouth daily with breakfast. appt needed for further refills 30 capsule 11   . DEXILANT 30 MG capsule Take 1 capsule (30 mg total) by mouth daily. (Patient not taking: Reported on 03/22/2018) 30 capsule 0 Not Taking at Unknown time    Current medications: Current Facility-Administered Medications  Medication Dose Route Frequency Provider Last Rate Last Dose  . acetaminophen (TYLENOL) tablet 650 mg  650 mg Oral Q6H PRN Gouru, Aruna, MD       Or  . acetaminophen (TYLENOL) suppository 650 mg  650 mg Rectal Q6H PRN Gouru,  Illene Silver, MD      . Chlorhexidine Gluconate Cloth 2 % PADS 6 each  6 each Topical Q0600 Zynasia Burklow, MD      . docusate sodium (COLACE) capsule 100 mg  100 mg Oral BID Gouru, Aruna, MD      . heparin injection 5,000 Units  5,000 Units Subcutaneous Q8H Lance Coon, MD      . ondansetron [facility]) tablet 4 mg  4 mg Oral Q6H PRN Gouru, Aruna, MD       Or  . ondansetron (ZOFRAN) injection 4 mg  4 mg Intravenous Q6H PRN Gouru, Aruna, MD      . venlafaxine XR (EFFEXOR-XR) 24 hr capsule 37.5 mg  37.5 mg Oral Q breakfast Gouru, Aruna, MD   37.5 mg at 03/23/18 0758      Allergies: Allergies  Allergen Reactions  . Ace Inhibitors Anaphylaxis  . Captopril Anaphylaxis  . Enalapril Anaphylaxis  . Fosinopril Anaphylaxis  . Lisinopril Anaphylaxis  . Ramipril Anaphylaxis      Past Medical History: Past Medical History:  Diagnosis Date  . Alcohol abuse, in remission   . Anxiety   . Arthritis   .  Avascular necrosis ([place])   . Depression   . GERD (gastroesophageal reflux disease)   . History of alcohol abuse   . Hyperlipidemia   . Hypertension   . IFG (impaired fasting glucose)   . Overweight      Past Surgical History: Past Surgical History:  Procedure Laterality Date  . CARDIAC CATHETERIZATION N/A 01/02/2016   Procedure: Left Heart Cath and Coronary Angiography;  Surgeon: Leonie Man, MD;  Location: [place] [facility];  Service: Cardiovascular;  Laterality: N/A;  . CARPAL TUNNEL RELEASE Bilateral    right x 2, left x 1  . TESTICLE TORSION REDUCTION    . TOTAL HIP ARTHROPLASTY Left 01/30/2014   Procedure: LEFT TOTAL HIP ARTHROPLASTY ANTERIOR APPROACH;  Surgeon: Mauri Pole, MD;  Location: [facility];  Service: Orthopedics;  Laterality: Left;  Marland Kitchen VASECTOMY  1995  . VASECTOMY REVERSAL  1997     Family History: Family History  Problem Relation Age of Onset  . Hypertension Mother   . Diabetes Mother   . Hypertension Father   . Stroke Father   . Cancer Neg Hx   . COPD Neg Hx   . Heart disease Neg Hx      Social History: Social History   Socioeconomic History  . Marital status: Married    Spouse name: Not on file  . Number of children: 5  . Years of education: 8  . Highest education level: Not on file  Occupational History  . Occupation: lawn care    Comment: [employer]  Social Needs  . Financial resource strain: Not on file  . Food insecurity:    Worry: Not on file    Inability: Not on file  . Transportation needs:    Medical: Not on file    Non-medical: Not on file  Tobacco Use  . Smoking status: Current Every Day Smoker    Packs/day: 1.00    Years: 30.00    Pack years: 30.00    Types: Cigarettes  . Smokeless tobacco: Never Used  Substance and Sexual Activity  . Alcohol use: No    Frequency: Never  . Drug use: No  . Sexual activity: Yes  Lifestyle  . Physical activity:    Days per week: Not on file    Minutes per  session: Not on  file  . Stress: Not on file  Relationships  . Social connections:    Talks on phone: Not on file    Gets together: Not on file    Attends religious service: Not on file    Active member of club or organization: Not on file    Attends meetings of clubs or organizations: Not on file    Relationship status: Not on file  . Intimate partner violence:    Fear of current or ex partner: Not on file    Emotionally abused: Not on file    Physically abused: Not on file    Forced sexual activity: Not on file  Other Topics Concern  . Not on file  Social History Narrative   epworth sleepiness scale = 6 (12/31/15)     Review of Systems: Review of Systems  Constitutional: Positive for diaphoresis and malaise/fatigue. Negative for chills and fever.  HENT: Negative for hearing loss, nosebleeds and tinnitus.   Eyes: Negative for blurred vision and double vision.  Respiratory: Negative for cough, hemoptysis and sputum production.   Cardiovascular: Negative for chest pain, palpitations and orthopnea.  Gastrointestinal: Positive for abdominal pain, diarrhea, nausea and vomiting.  Genitourinary: Negative for dysuria, frequency and urgency.  Musculoskeletal: Positive for myalgias.  Skin: Negative for itching and rash.  Neurological: Positive for dizziness. Negative for loss of consciousness.  Endo/Heme/Allergies: Negative for polydipsia. Does not bruise/bleed easily.  Psychiatric/Behavioral: Negative for hallucinations. The patient is nervous/anxious.      Vital Signs: Blood pressure 109/64, pulse 78, temperature 98.3 F (36.8 C), temperature source Oral, resp. rate 16, height 5\' 9"  (1.753 m), weight 86.2 kg (190 lb), SpO2 99 %.  Weight trends: Filed Weights   03/22/18 1707  Weight: 86.2 kg (190 lb)    Physical Exam: General: NAD, laying in bed  Head: Normocephalic, atraumatic.  Eyes: Anicteric, EOMI  Nose: Mucous membranes moist, not inflammed, nonerythematous.  Throat: Oropharynx  nonerythematous, no exudate appreciated.   Neck: Supple, trachea midline.  Lungs:  Normal respiratory effort. Clear to auscultation BL without crackles or wheezes.  Heart: RRR. S1 and S2 normal without gallop, murmur, or rubs.  Abdomen:  BS normoactive. Soft, Nondistended, non-tender.  No masses or organomegaly.  Extremities: No pretibial edema.  Neurologic: A&O X3, Motor strength is 5/5 in the all 4 extremities  Skin: No visible rashes, scars.    Lab results: Basic Metabolic Panel: Recent Labs  Lab 03/22/18 1730 03/23/18 0542  NA 136 132*  K 3.9 3.8  CL 87* 99*  CO2 14* 16*  GLUCOSE 223* 122*  BUN 41* 51*  CREATININE 6.08* 6.55*  CALCIUM 12.0* 8.4*    Liver Function Tests: Recent Labs  Lab 03/22/18 1730 03/23/18 0542  AST 50* 30  ALT 32 19  ALKPHOS 107 68  BILITOT 1.4* 0.9  PROT 10.7* 7.1  ALBUMIN 6.1* 3.9   Recent Labs  Lab 03/22/18 1730 03/23/18 0542  LIPASE 68* 30  AMYLASE  --  107*   No results for input(s): AMMONIA in the last 168 hours.  CBC: Recent Labs  Lab 03/22/18 1730 03/23/18 0542  WBC 19.2* 18.4*  NEUTROABS 16.6*  --   HGB 20.2* 15.7  HCT 59.1* 45.4  MCV 87.7 86.6  PLT 388 275    Cardiac Enzymes: Recent Labs  Lab 03/22/18 1730 03/22/18 1832 03/22/18 2309 03/23/18 0542 03/23/18 1035  CKTOTAL  --  1,368*  --  899*  --   TROPONINI 0.03*  --  0.03* 0.04* <0.03    BNP: Invalid input(s): POCBNP  CBG: No results for input(s): GLUCAP in the last 168 hours.  Microbiology: Results for orders placed or performed during the hospital encounter of 03/22/18  CULTURE, BLOOD (ROUTINE X 2) w Reflex to ID Panel     Status: None (Preliminary result)   Collection Time: 03/22/18 11:09 PM  Result Value Ref Range Status   Specimen Description BLOOD BLOOD RIGHT HAND  Final   Special Requests   Final    BOTTLES DRAWN AEROBIC AND ANAEROBIC Blood Culture adequate volume   Culture   Final    NO GROWTH < 12 HOURS Performed at [facility]  Lab, [address] [place], [place] [address]    Report Status PENDING  Incomplete  CULTURE, BLOOD (ROUTINE X 2) w Reflex to ID Panel     Status: None (Preliminary result)   Collection Time: 03/22/18 11:09 PM  Result Value Ref Range Status   Specimen Description BLOOD BLOOD LEFT HAND  Final   Special Requests   Final    BOTTLES DRAWN AEROBIC AND ANAEROBIC Blood Culture adequate volume   Culture   Final    NO GROWTH < 12 HOURS Performed at [facility] Lab, [address] [place], [place] [address]    Report Status PENDING  Incomplete    Coagulation Studies: No results for input(s): LABPROT, INR in the last 72 hours.  Urinalysis: No results for input(s): COLORURINE, LABSPEC, PHURINE, GLUCOSEU, HGBUR, BILIRUBINUR, KETONESUR, PROTEINUR, UROBILINOGEN, NITRITE, LEUKOCYTESUR in the last 72 hours.  Invalid input(s): APPERANCEUR    Imaging: [place]  Result Date: 03/22/2018 CLINICAL DATA:  47 year old male with acute kidney injury. EXAM: RENAL / URINARY TRACT ULTRASOUND COMPLETE COMPARISON:  Lumbar MRI 04/05/2013. FINDINGS: Right Kidney: Length: 10.6 centimeters. Echogenicity within normal limits. No mass or hydronephrosis visualized. Left Kidney: Length: 10.5 centimeters. Echogenicity within normal limits. No mass or hydronephrosis visualized. Bladder: Not identified, the patient voided just prior to imaging. Other findings: Echogenic liver (image 2). IMPRESSION: 1. Normal ultrasound appearance of both kidneys. Decompressed urinary bladder. 2. Hepatic steatosis. Electronically Signed   By: Odessa FlemingH  Hall M.D.   On: 03/22/2018 22:32   Dg Chest Port 1 View  Result Date: 03/22/2018 CLINICAL DATA:  Generalize cramping which shortness-of-breath and weakness. Symptoms beginning this morning. EXAM: PORTABLE CHEST 1 VIEW COMPARISON:  07/12/2017 FINDINGS: The heart size and mediastinal contours are within normal limits. Both lungs are clear. The visualized skeletal structures are unremarkable.  IMPRESSION: No active disease. Electronically Signed   By: Elberta Fortisaniel  Boyle M.D.   On: 03/22/2018 17:51      Assessment & Plan: Pt is a 47 y.o. male  with a PMHx of prior history of alcohol abuse, anxiety, depression, GERD, hypertension, hyperlipidemia, who was admitted to [facility] on 03/22/2018 for evaluation of muscle cramping.    1.  Acute renal failure suspect secondary to volume loss from sweating as well as diarrhea. 2.  Hyponatremia. 3.  Mild rhabdomyolysis. 4.  Metabolic acidosis.  Plan: The patient presents with severe acute renal failure.  The patient had training outdoors on Monday and states that he was sweating quite profusely at that time.  After the patient states that he developed severe diarrhea later on Monday evening.  He is already had 3 bowel movements today as well.  It appeared that he was quite hemoconcentrated upon admission with a hemoglobin of 20.2.  Hemoglobin has come down to 15.7.  However given worsening renal function and oliguria  we will proceed with initiation of dialysis.  We will consult with vascular surgery to place a temporary dialysis catheter.  Thereafter we will plan for a short dialysis treatment today.  Continue IV fluid hydration for now.  Renal ultrasound was negative for obstruction.  In addition we have sent off additional serologic work-up including ANA, SPEP, UPEP, ANCA antibodies, GBM antibodies, C3, and C4.  Further plan as patient progresses.

## 2018-03-23 NOTE — Consult Note (Signed)
[facility] Vascular Consult Note  MRN : [id number]  [name] White is a 47 y.o. (Oct 25, 1970) male who presents with chief complaint of  Chief Complaint  Patient presents with  . Abdominal Cramping  . Shortness of Breath  . Weakness  .  History of Present Illness: Patient is admitted to the hospital with acute renal failure secondary to rhabdomyolysis.  The patient reports malaise nausea vomiting and body aches.  The patient is critically ill with a creatinine of 6.0. The nephrology service has decided to initiate dialysis at this time, and we are asked to place a temporary dialysis catheter for immediate dialysis use.    Current Facility-Administered Medications  Medication Dose Route Frequency Provider Last Rate Last Dose  . acetaminophen (TYLENOL) tablet 650 mg  650 mg Oral Q6H PRN Gouru, Aruna, MD       Or  . acetaminophen (TYLENOL) suppository 650 mg  650 mg Rectal Q6H PRN Gouru, Aruna, MD      . Chlorhexidine Gluconate Cloth 2 % PADS 6 each  6 each Topical Q0600 Cherylann Ratel, [place], MD   6 each at 03/23/18 1342  . docusate sodium (COLACE) capsule 100 mg  100 mg Oral BID Gouru, Aruna, MD      . heparin injection 5,000 Units  5,000 Units Subcutaneous Tedra Coupe Oralia Manis, MD   5,000 Units at 03/23/18 1340  . ondansetron (ZOFRAN) tablet 4 mg  4 mg Oral Q6H PRN Gouru, Aruna, MD       Or  . ondansetron (ZOFRAN) injection 4 mg  4 mg Intravenous Q6H PRN Gouru, Aruna, MD      . venlafaxine XR (EFFEXOR-XR) 24 hr capsule 37.5 mg  37.5 mg Oral Q breakfast Gouru, Aruna, MD   37.5 mg at 03/23/18 7829    Past Medical History:  Diagnosis Date  . Alcohol abuse, in remission   . Anxiety   . Arthritis   . Avascular necrosis ([facility])   . Depression   . GERD (gastroesophageal reflux disease)   . History of alcohol abuse   . Hyperlipidemia   . Hypertension   . IFG (impaired fasting glucose)   . Overweight     Past Surgical History:  Procedure Laterality Date  . CARDIAC  CATHETERIZATION N/A 01/02/2016   Procedure: Left Heart Cath and Coronary Angiography;  Surgeon: Marykay Lex, MD;  Location: [facility] INVASIVE CV LAB;  Service: Cardiovascular;  Laterality: N/A;  . CARPAL TUNNEL RELEASE Bilateral    right x 2, left x 1  . TESTICLE TORSION REDUCTION    . TOTAL HIP ARTHROPLASTY Left 01/30/2014   Procedure: LEFT TOTAL HIP ARTHROPLASTY ANTERIOR APPROACH;  Surgeon: Shelda Pal, MD;  Location: [facility];  Service: Orthopedics;  Laterality: Left;  Marland Kitchen VASECTOMY  1995  . VASECTOMY REVERSAL  1997    Social History Social History   Tobacco Use  . Smoking status: Current Every Day Smoker    Packs/day: 1.00    Years: 30.00    Pack years: 30.00    Types: Cigarettes  . Smokeless tobacco: Never Used  Substance Use Topics  . Alcohol use: No    Frequency: Never  . Drug use: No    Family History Family History  Problem Relation Age of Onset  . Hypertension Mother   . Diabetes Mother   . Hypertension Father   . Stroke Father   . Cancer Neg Hx   . COPD Neg Hx   . Heart disease Neg Hx  No family history of bleeding/clotting disorders, porphyria or autoimmune disease   Allergies  Allergen Reactions  . Ace Inhibitors Anaphylaxis  . Captopril Anaphylaxis  . Enalapril Anaphylaxis  . Fosinopril Anaphylaxis  . Lisinopril Anaphylaxis  . Ramipril Anaphylaxis     REVIEW OF SYSTEMS (Negative unless checked)  Constitutional: [] Weight loss  [] Fever  [] Chills Cardiac: [] Chest pain   [] Chest pressure   [] Palpitations   [] Shortness of breath when laying flat   [] Shortness of breath at rest   [] Shortness of breath with exertion. Vascular:  [] Pain in legs with walking   [x] Pain in legs at rest   [] Pain in legs when laying flat   [] Claudication   [] Pain in feet when walking  [] Pain in feet at rest  [] Pain in feet when laying flat   [] History of DVT   [] Phlebitis   [x] Swelling in legs   [] Varicose veins   [] Non-healing ulcers Pulmonary:   [] Uses home oxygen   [] Productive  cough   [] Hemoptysis   [] Wheeze  [] COPD   [] Asthma Neurologic:  [] Dizziness  [] Blackouts   [] Seizures   [] History of stroke   [] History of TIA  [] Aphasia   [] Temporary blindness   [] Dysphagia   [] Weakness or numbness in arms   [] Weakness or numbness in legs Musculoskeletal:  [] Arthritis   [] Joint swelling   [] Joint pain   [] Low back pain Hematologic:  [] Easy bruising  [] Easy bleeding   [] Hypercoagulable state   [] Anemic  [] Hepatitis Gastrointestinal:  [] Blood in stool   [] Vomiting blood  [] Gastroesophageal reflux/heartburn   [] Difficulty swallowing. Genitourinary:  [x] Chronic kidney disease   [] Difficult urination  [] Frequent urination  [] Burning with urination   [] Blood in urine Skin:  [] Rashes   [] Ulcers   [] Wounds Psychological:  [] History of anxiety   []  History of major depression.  Unable to obtain the review of systems due to the patient's severe systemic illness and altered mental status.       Physical Examination  Vitals:   03/23/18 0700 03/23/18 1222 03/23/18 1238 03/23/18 1846  BP: 119/67 (!) 93/59 109/64 103/68  Pulse: 91 78  86  Resp:  16    Temp: 97.6 F (36.4 C) 98.3 F (36.8 C)    TempSrc: Oral Oral    SpO2: 97% 99%    Weight:      Height:       Body mass index is 28.06 kg/m. Gen: WD/WN Head: [place]/AT, No temporalis wasting Ear/Nose/Throat: Hearing grossly intact, nares w/o erythema or drainage Eyes: Sclera non-icteric, conjunctiva clear Neck: Supple, no nuchal rigidity.  No JVD.  Pulmonary:  Good air movement, clear to auscultation bilaterally.  Cardiac: RRR, normal S1, S2, no Murmurs, rubs or gallops. Vascular: 2+ femoral pulses bilaterally groins clean dry and intact Gastrointestinal: soft, non-tender/non-distended. No guarding/reflex.  Musculoskeletal: M/S 4/5 throughout.  Extremities without ischemic changes.   3+ edema in the lower extremities bilaterally Neurologic: Confused with poor insight Psychiatric: Difficult to assess due to the severity of  patient's illness. Dermatologic: No rashes or ulcers noted.   Lymph : No Cervical, Axillary, or Inguinal lymphadenopathy.      CBC Lab Results  Component Value Date   WBC 18.4 (H) 03/23/2018   HGB 15.7 03/23/2018   HCT 45.4 03/23/2018   MCV 86.6 03/23/2018   PLT 275 03/23/2018    BMET    Component Value Date/Time   NA 132 (L) 03/23/2018 0542   NA 139 11/06/2015 1419   K 3.8 03/23/2018 0542   CL 99 (  L) 03/23/2018 0542   CO2 16 (L) 03/23/2018 0542   GLUCOSE 122 (H) 03/23/2018 0542   BUN 51 (H) 03/23/2018 0542   BUN 10 11/06/2015 1419   CREATININE 6.55 (H) 03/23/2018 0542   CREATININE 0.82 11/04/2017 1159   CALCIUM 8.4 (L) 03/23/2018 0542   GFRNONAA 9 (L) 03/23/2018 0542   GFRNONAA 105 11/04/2017 1159   GFRAA 10 (L) 03/23/2018 0542   GFRAA 121 11/04/2017 1159   Estimated Creatinine Clearance: 14.8 mL/min (A) (by C-G formula based on SCr of 6.55 mg/dL (H)).  COAG Lab Results  Component Value Date   INR 0.95 07/12/2017   INR 0.91 12/31/2015   INR 0.89 01/23/2014    Radiology US Renal  Result Date: 03/22/2018 CLINICAL DATA:  47 year old male with acute kidney injury. EXAM: RENAL / URINARY TRACT ULTRASOUND COMPLETE COMPARISON:  Lumbar MRI 04/05/2013. FINDINGS: Right Kidney: Length: 10.6 centimeters. Echogenicity within normal limits. No mass or hydronephrosis visualized. Left Kidney: Length: 10.5 centimeters. Echogenicity within normal limits. No mass or hydronephrosis visualized. Bladder: Not identified, the patient voided just prior to imaging. Other findings: Echogenic liver (image 2). IMPRESSION: 1. Normal ultrasound appearance of both kidneys. Decompressed urinary bladder. 2. Hepatic steatosis. Electronically Signed   By: Odessa Fleming M.D.   On: 03/22/2018 22:32   Dg Chest Port 1 View  Result Date: 03/22/2018 CLINICAL DATA:  Generalize cramping which shortness-of-breath and weakness. Symptoms beginning this morning. EXAM: PORTABLE CHEST 1 VIEW COMPARISON:  07/12/2017  FINDINGS: The heart size and mediastinal contours are within normal limits. Both lungs are clear. The visualized skeletal structures are unremarkable. IMPRESSION: No active disease. Electronically Signed   By: Elberta Fortis M.D.   On: 03/22/2018 17:51      Assessment/Plan 1. ARF 2.  Rhabdomyolysis   We will proceed with temporary dialysis catheter placement at this time.  Risks and benefits discussed with patient and/or family, and the catheter will be placed to allow immediate initiation of dialysis.  If the patient's renal function does not improve throughout the hospital course, we will be happy to place a tunneled dialysis catheter for long term use prior to discharge.     Levora Dredge, MD  03/23/2018 7:36 PM

## 2018-03-23 NOTE — Progress Notes (Signed)
Dr Nemiah CommanderKalisetti informed of diarrhea.  No new orders at this time

## 2018-03-23 NOTE — Progress Notes (Signed)
Patient returned from vascular.  Temporary catheter in right groin

## 2018-03-23 NOTE — Progress Notes (Signed)
Orderly transported the patient to vascular lab.  I did notify the wife of current plan per the patient's request

## 2018-03-23 NOTE — Progress Notes (Signed)
[facility] [place] at [facility]   PATIENT NAME: [name] White    MR#:  [id number]  DATE OF BIRTH:  03/13/1971  SUBJECTIVE:  CHIEF COMPLAINT:   Chief Complaint  Patient presents with  . Abdominal Cramping  . Shortness of Breath  . Weakness   -Patient complains of cramps, nausea, muscle pain and tiredness. - oliguric- no urine made for >36 hrs - will be started on hemodialysis today  REVIEW OF SYSTEMS:  Review of Systems  Constitutional: Positive for malaise/fatigue. Negative for chills and fever.  HENT: Negative for congestion, ear discharge, hearing loss and nosebleeds.   Eyes: Negative for blurred vision and double vision.  Respiratory: Negative for cough, shortness of breath and wheezing.   Cardiovascular: Negative for chest pain and palpitations.  Gastrointestinal: Positive for nausea. Negative for abdominal pain, constipation, diarrhea and vomiting.  Genitourinary: Negative for dysuria.  Musculoskeletal: Positive for myalgias.  Neurological: Negative for dizziness, focal weakness, seizures, weakness and headaches.  Psychiatric/Behavioral: Negative for depression.    DRUG ALLERGIES:   Allergies  Allergen Reactions  . Ace Inhibitors Anaphylaxis  . Captopril Anaphylaxis  . Enalapril Anaphylaxis  . Fosinopril Anaphylaxis  . Lisinopril Anaphylaxis  . Ramipril Anaphylaxis    VITALS:  Blood pressure 119/67, pulse 91, temperature 97.6 F (36.4 C), temperature source Oral, resp. rate 20, height 5\' 9"  (1.753 m), weight 86.2 kg (190 lb), SpO2 97 %.  PHYSICAL EXAMINATION:  Physical Exam  GENERAL:  47 y.o.-year-old patient lying in the bed with no acute distress.  EYES: Pupils equal, round, reactive to light and accommodation. No scleral icterus. Extraocular muscles intact.  HEENT: Head atraumatic, normocephalic. Oropharynx and nasopharynx clear.  NECK:  Supple, no jugular venous distention. No thyroid enlargement, no tenderness.  LUNGS: Normal  breath sounds bilaterally, no wheezing, rales,rhonchi or crepitation. No use of accessory muscles of respiration.  CARDIOVASCULAR: S1, S2 normal. No murmurs, rubs, or gallops.  ABDOMEN: Soft, nontender, nondistended. Bowel sounds present. No organomegaly or mass.  EXTREMITIES: No pedal edema, cyanosis, or clubbing.  NEUROLOGIC: Cranial nerves II through XII are intact. Muscle strength 5/5 in all extremities. Sensation intact. Gait not checked.  PSYCHIATRIC: The patient is alert and oriented x 3.  SKIN: No obvious rash, lesion, or ulcer.    LABORATORY PANEL:   CBC Recent Labs  Lab 03/23/18 0542  WBC 18.4*  HGB 15.7  HCT 45.4  PLT 275   ------------------------------------------------------------------------------------------------------------------  Chemistries  Recent Labs  Lab 03/23/18 0542  NA 132*  K 3.8  CL 99*  CO2 16*  GLUCOSE 122*  BUN 51*  CREATININE 6.55*  CALCIUM 8.4*  AST 30  ALT 19  ALKPHOS 68  BILITOT 0.9   ------------------------------------------------------------------------------------------------------------------  Cardiac Enzymes Recent Labs  Lab 03/23/18 1035  TROPONINI <0.03   ------------------------------------------------------------------------------------------------------------------  RADIOLOGY:  [place] Renal  Result Date: 03/22/2018 CLINICAL DATA:  47 year old male with acute kidney injury. EXAM: RENAL / URINARY TRACT ULTRASOUND COMPLETE COMPARISON:  Lumbar MRI 04/05/2013. FINDINGS: Right Kidney: Length: 10.6 centimeters. Echogenicity within normal limits. No mass or hydronephrosis visualized. Left Kidney: Length: 10.5 centimeters. Echogenicity within normal limits. No mass or hydronephrosis visualized. Bladder: Not identified, the patient voided just prior to imaging. Other findings: Echogenic liver (image 2). IMPRESSION: 1. Normal ultrasound appearance of both kidneys. Decompressed urinary bladder. 2. Hepatic steatosis. Electronically  Signed   By: Odessa FlemingH  Hall M.D.   On: 03/22/2018 22:32   Dg Chest Port 1 View  Result Date: 03/22/2018 CLINICAL DATA:  Generalize cramping which shortness-of-breath and weakness. Symptoms beginning this morning. EXAM: PORTABLE CHEST 1 VIEW COMPARISON:  07/12/2017 FINDINGS: The heart size and mediastinal contours are within normal limits. Both lungs are clear. The visualized skeletal structures are unremarkable. IMPRESSION: No active disease. Electronically Signed   By: Elberta Fortis M.D.   On: 03/22/2018 17:51    EKG:   Orders placed or performed during the hospital encounter of 03/22/18  . EKG 12-Lead  . EKG 12-Lead  . ED EKG  . ED EKG    ASSESSMENT AND PLAN:   47 year old male with past medical history significant for hypertension, alcohol abuse, GERD presents to hospital secondary to muscle cramps and nausea.  And noted to be in acute renal failure.  1. ARF- cr from jan 2019 is normal, now >6 - heat exhaustion noted- did not improve with IV fluids - discontinue fluids, anuric now - US renal with no hydronephrosis -Nephrology consulted -Serological tests ordered to figure out the cause of renal failure -Vascular consult for temporary dialysis catheter placement and possible hemodialysis today - patient denies taking any NSAIDS  2.  Rhabdomyolysis-with renal failure. -Unable to provide urine.  Follow-up CPK -For dialysis today  3. HTN-low normal blood pressure, hold Norvasc and metoprolol  4.  Leukocytosis-no source of infection noted at this time.  Continue to monitor -Urine not provided  5.  DVT prophylaxis-on subcutaneous heparin   All the records are reviewed and case discussed with Care Management/Social Workerr. Management plans discussed with the patient, family and they are in agreement.  CODE STATUS: Full Code  TOTAL TIME TAKING CARE OF THIS PATIENT: 38 minutes.   POSSIBLE D/C IN 2-3 DAYS, DEPENDING ON CLINICAL CONDITION.   Enid Baas M.D on 03/23/2018 at  12:17 PM  Between 7am to 6pm - Pager - [phone]  After 6pm go to [email or web address] - Social research officer, government  [facility]  Office  [phone]  CC: Primary care physician; Kerman Passey, MD

## 2018-03-24 ENCOUNTER — Encounter: Payer: Self-pay | Admitting: Vascular Surgery

## 2018-03-24 LAB — PROTEIN ELECTROPHORESIS, SERUM
A/G Ratio: 1.2 (ref 0.7–1.7)
Albumin ELP: 3.5 g/dL (ref 2.9–4.4)
Alpha-1-Globulin: 0.3 g/dL (ref 0.0–0.4)
Alpha-2-Globulin: 1 g/dL (ref 0.4–1.0)
Beta Globulin: 1 g/dL (ref 0.7–1.3)
Gamma Globulin: 0.6 g/dL (ref 0.4–1.8)
Globulin, Total: 2.9 g/dL (ref 2.2–3.9)
Total Protein ELP: 6.4 g/dL (ref 6.0–8.5)

## 2018-03-24 LAB — BASIC METABOLIC PANEL
Anion gap: 17 — ABNORMAL HIGH (ref 5–15)
BUN: 70 mg/dL — ABNORMAL HIGH (ref 6–20)
CO2: 16 mmol/L — ABNORMAL LOW (ref 22–32)
Calcium: 8.1 mg/dL — ABNORMAL LOW (ref 8.9–10.3)
Chloride: 99 mmol/L — ABNORMAL LOW (ref 101–111)
Creatinine, Ser: 8.62 mg/dL — ABNORMAL HIGH (ref 0.61–1.24)
GFR calc Af Amer: 7 mL/min — ABNORMAL LOW (ref >60)
GFR calc non Af Amer: 6 mL/min — ABNORMAL LOW (ref >60)
Glucose, Bld: 94 mg/dL (ref 65–99)
Potassium: 4 mmol/L (ref 3.5–5.1)
Sodium: 132 mmol/L — ABNORMAL LOW (ref 135–145)

## 2018-03-24 LAB — C3 COMPLEMENT: C3 Complement: 150 mg/dL (ref 82–167)

## 2018-03-24 LAB — MPO/PR-3 (ANCA) ANTIBODIES
ANCA Proteinase 3: <3.5 U/mL (ref 0.0–3.5)
Myeloperoxidase Abs: <9 U/mL (ref 0.0–9.0)

## 2018-03-24 LAB — GLOMERULAR BASEMENT MEMBRANE ANTIBODIES: GBM Ab: 2 units (ref 0–20)

## 2018-03-24 LAB — PHOSPHORUS: Phosphorus: 7.4 mg/dL — ABNORMAL HIGH (ref 2.5–4.6)

## 2018-03-24 LAB — ANA W/REFLEX IF POSITIVE: Anti Nuclear Antibody(ANA): NEGATIVE

## 2018-03-24 LAB — C4 COMPLEMENT: Complement C4, Body Fluid: 39 mg/dL (ref 14–44)

## 2018-03-24 MED ORDER — PENTAFLUOROPROP-TETRAFLUOROETH EX AERO
1.0000 "application " | INHALATION_SPRAY | CUTANEOUS | Status: DC | PRN
  Filled 2018-03-24: qty 30

## 2018-03-24 MED ORDER — TUBERCULIN PPD 5 UNIT/0.1ML ID SOLN
5.0000 [IU] | Freq: Once | INTRADERMAL | Status: DC
  Administered 2018-03-24: 5 [IU] via INTRADERMAL
  Filled 2018-03-24: qty 0.1

## 2018-03-24 MED ORDER — ALTEPLASE 2 MG IJ SOLR
2.0000 mg | Freq: Once | INTRAMUSCULAR | Status: AC
Start: 2018-03-24 — End: 2018-03-24
  Administered 2018-03-24: 2 mg

## 2018-03-24 MED ORDER — ALTEPLASE 2 MG IJ SOLR
2.0000 mg | Freq: Once | INTRAMUSCULAR | Status: DC | PRN
  Filled 2018-03-24: qty 2

## 2018-03-24 MED ORDER — HEPARIN SODIUM (PORCINE) 1000 UNIT/ML DIALYSIS
1000.0000 [IU] | INTRAMUSCULAR | Status: DC | PRN
  Filled 2018-03-24: qty 1

## 2018-03-24 MED ORDER — LIDOCAINE-PRILOCAINE 2.5-2.5 % EX CREA
1.0000 "application " | TOPICAL_CREAM | CUTANEOUS | Status: DC | PRN
  Filled 2018-03-24: qty 5

## 2018-03-24 MED ORDER — LIDOCAINE HCL (PF) 1 % IJ SOLN
5.0000 mL | INTRAMUSCULAR | Status: DC | PRN
  Filled 2018-03-24: qty 5

## 2018-03-24 MED ORDER — SODIUM CHLORIDE 0.9 % IV SOLN
1.0000 g | INTRAVENOUS | Status: DC
  Administered 2018-03-24 – 2018-03-25 (×2): 1 g via INTRAVENOUS
  Filled 2018-03-24: qty 1
  Filled 2018-03-24: qty 10
  Filled 2018-03-24: qty 1

## 2018-03-24 MED ORDER — SODIUM CHLORIDE 0.9 % IV SOLN: 100.0000 mL | INTRAVENOUS | Status: DC | PRN

## 2018-03-24 MED ORDER — ALTEPLASE 2 MG IJ SOLR
2.0000 mg | Freq: Once | INTRAMUSCULAR | Status: AC
  Administered 2018-03-24: 2 mg
  Filled 2018-03-24: qty 2

## 2018-03-24 MED ORDER — ALUM & MAG HYDROXIDE-SIMETH 200-200-20 MG/5ML PO SUSP
30.0000 mL | Freq: Four times a day (QID) | ORAL | Status: DC | PRN
Start: 2018-03-24 — End: 2018-03-25
  Administered 2018-03-24: 30 mL via ORAL
  Filled 2018-03-24: qty 30

## 2018-03-24 NOTE — Progress Notes (Signed)
Post HD assessment    03/24/18 1135  Neurological  Level of Consciousness Alert  Orientation Level Oriented X4  Respiratory  Respiratory Pattern Regular;Unlabored  Chest Assessment Chest expansion symmetrical  Cardiac  ECG Monitor Yes  Antiarrhythmic device No  Vascular  R Radial Pulse +2  L Radial Pulse +2  Integumentary  Integumentary (WDL) X  Skin Color Appropriate for ethnicity  Musculoskeletal  Musculoskeletal (WDL) WDL  GU Assessment  Genitourinary (WDL) X  Genitourinary Symptoms  (HD)  Psychosocial  Psychosocial (WDL) WDL

## 2018-03-24 NOTE — Progress Notes (Signed)
HD tx start    03/24/18 1002  Vital Signs  Pulse Rate 80  Pulse Rate Source Monitor  Resp 16  BP 91/60  BP Location Right Arm  BP Method Automatic  Patient Position (if appropriate) Lying  Oxygen Therapy  SpO2 97 %  O2 Device Room Air  During Hemodialysis Assessment  Blood Flow Rate (mL/min) 150 mL/min  Arterial Pressure (mmHg) -130 mmHg  Venous Pressure (mmHg) 60 mmHg  Transmembrane Pressure (mmHg) 40 mmHg  Ultrafiltration Rate (mL/min) 330 mL/min  Dialysate Flow Rate (mL/min) 300 ml/min  Conductivity: Machine  14.2  HD Safety Checks Performed Yes  Dialysis Fluid Bolus Normal Saline  Bolus Amount (mL) 250 mL  Intra-Hemodialysis Comments Tx initiated  Hemodialysis Catheter Right Femoral vein Triple-lumen  Placement Date/Time: 03/23/18 1227   Placed prior to admission: No  Person Inserting Catheter: Dr Gilda CreaseSchnier  Orientation: Right  Access Location: Femoral vein  Hemodialysis Catheter Type: Triple-lumen  Blue Lumen Status Infusing  Red Lumen Status Infusing

## 2018-03-24 NOTE — Progress Notes (Signed)
Pre HD assessment    03/24/18 0952  Neurological  Level of Consciousness Alert  Orientation Level Oriented X4  Respiratory  Respiratory Pattern Regular;Unlabored  Chest Assessment Chest expansion symmetrical  Cardiac  ECG Monitor Yes  Antiarrhythmic device No  Vascular  R Radial Pulse +2  L Radial Pulse +2  Integumentary  Integumentary (WDL) X  Skin Color Appropriate for ethnicity  Musculoskeletal  Musculoskeletal (WDL) WDL  GU Assessment  Genitourinary (WDL) X  Genitourinary Symptoms  (HD)  Psychosocial  Psychosocial (WDL) WDL

## 2018-03-24 NOTE — Progress Notes (Signed)
Post HD assessment. Pt tolerated tx well without c/o. Arterial line clotted, cathflow ordered per MD orders, waiting on pharmacy now. Net UF -32, goal met. Tx ended 17 minutes earlt r/t clotting, MD aware.    03/24/18 1130  Hand-Off documentation  Report given to (Full Name) Harless Nakayama  Report received from (Full Name) Stark Bray  Vital Signs  Temp 98.5 F (36.9 C)  Temp Source Oral  Pulse Rate 80  Pulse Rate Source Monitor  Resp 14  BP 104/64  BP Location Right Arm  BP Method Automatic  Patient Position (if appropriate) Lying  Oxygen Therapy  SpO2 97 %  O2 Device Room Air  Dialysis Weight  Weight 84.7 kg (186 lb 11.7 oz)  Type of Weight Post-Dialysis  Post-Hemodialysis Assessment  Rinseback Volume (mL) 100 mL  KECN 10.8 V  Dialyzer Clearance Heavily streaked  Duration of HD Treatment -hour(s) 1.25 hour(s)  Hemodialysis Intake (mL) 370 mL  UF Total -Machine (mL) 402 mL  Net UF (mL) 32 mL  Tolerated HD Treatment Yes  Education / Care Plan  Dialysis Education Provided Yes  Documented Education in Care Plan Yes  Hemodialysis Catheter Right Femoral vein Triple-lumen  Placement Date/Time: 03/23/18 1227   Placed prior to admission: No  Person Inserting Catheter: Dr Delana Meyer  Orientation: Right  Access Location: Femoral vein  Hemodialysis Catheter Type: Triple-lumen  Site Condition No complications;Other (Comment) (clotted, waiting on cathflow from pharmacy now)  Blue Lumen Status Heparin locked  Red Lumen Status Occluded  Dressing Type Biopatch  Dressing Status Clean;Dry;Intact  Drainage Description None  Post treatment catheter status Capped and Clamped

## 2018-03-24 NOTE — Progress Notes (Signed)
[facility]  ROUNDING NOTE   Subjective:  Patient seen at bedside. Still making very little urine. Renal function actually worse at the moment with a creatinine of 8.6. Urine protein to creatinine ratio was found to be 0.5. Patient will be due for his first dialysis treatment today. Right femoral dialysis catheter was placed.  Objective:  Vital signs in last 24 hours:  Temp:  [97.9 F (36.6 C)-98.3 F (36.8 C)] 97.9 F (36.6 C) (06/20 0514) Pulse Rate:  [74-86] 76 (06/20 0514) Resp:  [16-18] 18 (06/20 0514) BP: (93-112)/(59-68) 109/59 (06/20 0514) SpO2:  [97 %-99 %] 97 % (06/20 0514)  Weight change:  Filed Weights   03/22/18 1707  Weight: 86.2 kg (190 lb)    Intake/Output: I/O last 3 completed shifts: In: 1827.5 [I.V.:1827.5] Out: -    Intake/Output this shift:  No intake/output data recorded.  Physical Exam: General: No acute distress  Head: Normocephalic, atraumatic. Moist oral mucosal membranes  Eyes: Anicteric  Neck: Supple, trachea midline  Lungs:  Clear to auscultation, normal effort  Heart: S1S2 no rubs  Abdomen:  Soft, nontender, bowel sounds present  Extremities:  peripheral edema.  Neurologic: Awake, alert, following commands  Skin: No lesions  Access: Right temporary femoral dialysis catheter placed    Basic Metabolic Panel: Recent Labs  Lab 03/22/18 1730 03/23/18 0542 03/24/18 0504  NA 136 132* 132*  K 3.9 3.8 4.0  CL 87* 99* 99*  CO2 14* 16* 16*  GLUCOSE 223* 122* 94  BUN 41* 51* 70*  CREATININE 6.08* 6.55* 8.62*  CALCIUM 12.0* 8.4* 8.1*  PHOS  --   --  7.4*    Liver Function Tests: Recent Labs  Lab 03/22/18 1730 03/23/18 0542  AST 50* 30  ALT 32 19  ALKPHOS 107 68  BILITOT 1.4* 0.9  PROT 10.7* 7.1  ALBUMIN 6.1* 3.9   Recent Labs  Lab 03/22/18 1730 03/23/18 0542  LIPASE 68* 30  AMYLASE  --  107*   No results for input(s): AMMONIA in the last 168 hours.  CBC: Recent Labs  Lab 03/22/18 1730  03/23/18 0542  WBC 19.2* 18.4*  NEUTROABS 16.6*  --   HGB 20.2* 15.7  HCT 59.1* 45.4  MCV 87.7 86.6  PLT 388 275    Cardiac Enzymes: Recent Labs  Lab 03/22/18 1730 03/22/18 1832 03/22/18 2309 03/23/18 0542 03/23/18 1035  CKTOTAL  --  1,368*  --  899*  --   TROPONINI 0.03*  --  0.03* 0.04* <0.03    BNP: Invalid input(s): POCBNP  CBG: No results for input(s): GLUCAP in the last 168 hours.  Microbiology: Results for orders placed or performed during the hospital encounter of 03/22/18  CULTURE, BLOOD (ROUTINE X 2) w Reflex to ID Panel     Status: None (Preliminary result)   Collection Time: 03/22/18 11:09 PM  Result Value Ref Range Status   Specimen Description BLOOD BLOOD RIGHT HAND  Final   Special Requests   Final    BOTTLES DRAWN AEROBIC AND ANAEROBIC Blood Culture adequate volume   Culture   Final    NO GROWTH 2 DAYS Performed at [facility], [address] [place], [place] [address]    Report Status PENDING  Incomplete  CULTURE, BLOOD (ROUTINE X 2) w Reflex to ID Panel     Status: None (Preliminary result)   Collection Time: 03/22/18 11:09 PM  Result Value Ref Range Status   Specimen Description BLOOD BLOOD LEFT HAND  Final  Special Requests   Final    BOTTLES DRAWN AEROBIC AND ANAEROBIC Blood Culture adequate volume   Culture   Final    NO GROWTH 2 DAYS Performed at [facility], [address] [place], [place] [address]    Report Status PENDING  Incomplete    Coagulation Studies: No results for input(s): LABPROT, INR in the last 72 hours.  Urinalysis: Recent Labs    03/23/18 1839  COLORURINE AMBER*  LABSPEC 1.018  PHURINE 5.0  GLUCOSEU NEGATIVE  HGBUR SMALL*  BILIRUBINUR NEGATIVE  KETONESUR NEGATIVE  PROTEINUR 100*  NITRITE NEGATIVE  LEUKOCYTESUR NEGATIVE      Imaging: US Renal  Result Date: 03/22/2018 CLINICAL DATA:  47 year old male with acute kidney injury. EXAM: RENAL / URINARY TRACT ULTRASOUND COMPLETE  COMPARISON:  Lumbar MRI 04/05/2013. FINDINGS: Right Kidney: Length: 10.6 centimeters. Echogenicity within normal limits. No mass or hydronephrosis visualized. Left Kidney: Length: 10.5 centimeters. Echogenicity within normal limits. No mass or hydronephrosis visualized. Bladder: Not identified, the patient voided just prior to imaging. Other findings: Echogenic liver (image 2). IMPRESSION: 1. Normal ultrasound appearance of both kidneys. Decompressed urinary bladder. 2. Hepatic steatosis. Electronically Signed   By: Odessa Fleming M.D.   On: 03/22/2018 22:32   Dg Chest Port 1 View  Result Date: 03/22/2018 CLINICAL DATA:  Generalize cramping which shortness-of-breath and weakness. Symptoms beginning this morning. EXAM: PORTABLE CHEST 1 VIEW COMPARISON:  07/12/2017 FINDINGS: The heart size and mediastinal contours are within normal limits. Both lungs are clear. The visualized skeletal structures are unremarkable. IMPRESSION: No active disease. Electronically Signed   By: Elberta Fortis M.D.   On: 03/22/2018 17:51     Medications:   . sodium chloride    . sodium chloride    . cefTRIAXone (ROCEPHIN)  IV     . Chlorhexidine Gluconate Cloth  6 each Topical Q0600  . docusate sodium  100 mg Oral BID  . heparin injection (subcutaneous)  5,000 Units Subcutaneous Q8H  . tuberculin  5 Units Intradermal Once  . venlafaxine XR  37.5 mg Oral Q breakfast   sodium chloride, sodium chloride, acetaminophen **OR** acetaminophen, alteplase, heparin, lidocaine (PF), lidocaine-prilocaine, ondansetron **OR** ondansetron (ZOFRAN) IV, pentafluoroprop-tetrafluoroeth  Assessment/ Plan:  47 y.o. male with a PMHx of prior history of alcohol abuse, anxiety, depression, GERD, hypertension, hyperlipidemia, who was admitted to [facility] on 03/22/2018 for evaluation of muscle cramping.    1.  Acute renal failure suspect secondary to volume loss from sweating as well as diarrhea. 2.  Hyponatremia. 3.  Mild rhabdomyolysis. 4.  Metabolic  acidosis.  Plan: The patient is still making very little urine.  BUN and creatinine have worsened which at times is to be expected with acute tubular necrosis.  Patient states that he has started to make some mild bit of urine.  Temporary right femoral dialysis catheter has been placed.  We will plan for his first dialysis treatment today followed by a second treatment tomorrow.  We will continue to monitor urine output, serum electrolytes daily for now.  We are also awaiting full panel of serologic work-up.     LOS: 2 Aerianna Losey 6/20/20199:44 AM

## 2018-03-24 NOTE — Progress Notes (Signed)
Pharmacy brought pts medication to dialysis unit

## 2018-03-24 NOTE — Progress Notes (Signed)
[facility] - [place] at [facility]   PATIENT NAME: [name] White    MR#:  [id number]  DATE OF BIRTH:  05/28/1971  SUBJECTIVE:  CHIEF COMPLAINT:   Chief Complaint  Patient presents with  . Abdominal Cramping  . Shortness of Breath  . Weakness   -Patient admitted with new onset acute renal failure and anuria.  Minimal urine output this morning. -Had temporary right groin hemodialysis catheter placed last evening.  For dialysis today  REVIEW OF SYSTEMS:  Review of Systems  Constitutional: Positive for malaise/fatigue. Negative for chills and fever.  HENT: Negative for congestion, ear discharge, hearing loss and nosebleeds.   Eyes: Negative for blurred vision and double vision.  Respiratory: Negative for cough, shortness of breath and wheezing.   Cardiovascular: Negative for chest pain and palpitations.  Gastrointestinal: Positive for nausea. Negative for abdominal pain, constipation, diarrhea and vomiting.  Genitourinary: Negative for dysuria.  Musculoskeletal: Positive for myalgias.  Neurological: Negative for dizziness, focal weakness, seizures, weakness and headaches.  Psychiatric/Behavioral: Negative for depression.    DRUG ALLERGIES:   Allergies  Allergen Reactions  . Ace Inhibitors Anaphylaxis  . Captopril Anaphylaxis  . Enalapril Anaphylaxis  . Fosinopril Anaphylaxis  . Lisinopril Anaphylaxis  . Ramipril Anaphylaxis    VITALS:  Blood pressure (!) 109/59, pulse 76, temperature 97.9 F (36.6 C), temperature source Oral, resp. rate 18, height 5\' 9"  (1.753 m), weight 86.2 kg (190 lb), SpO2 97 %.  PHYSICAL EXAMINATION:  Physical Exam  GENERAL:  47 y.o.-year-old patient lying in the bed with no acute distress.  EYES: Pupils equal, round, reactive to light and accommodation. No scleral icterus. Extraocular muscles intact.  HEENT: Head atraumatic, normocephalic. Oropharynx and nasopharynx clear.  NECK:  Supple, no jugular venous distention. No  thyroid enlargement, no tenderness.  LUNGS: Normal breath sounds bilaterally, no wheezing, rales,rhonchi or crepitation. No use of accessory muscles of respiration.  CARDIOVASCULAR: S1, S2 normal. No murmurs, rubs, or gallops.  ABDOMEN: Soft, nontender, nondistended. Bowel sounds present. No organomegaly or mass.  EXTREMITIES: No pedal edema, cyanosis, or clubbing.  -Right groin catheter present.  No bleeding or swelling noted NEUROLOGIC: Cranial nerves II through XII are intact. Muscle strength 5/5 in all extremities. Sensation intact. Gait not checked.  PSYCHIATRIC: The patient is alert and oriented x 3.  SKIN: No obvious rash, lesion, or ulcer.    LABORATORY PANEL:   CBC Recent Labs  Lab 03/23/18 0542  WBC 18.4*  HGB 15.7  HCT 45.4  PLT 275   ------------------------------------------------------------------------------------------------------------------  Chemistries  Recent Labs  Lab 03/23/18 0542 03/24/18 0504  NA 132* 132*  K 3.8 4.0  CL 99* 99*  CO2 16* 16*  GLUCOSE 122* 94  BUN 51* 70*  CREATININE 6.55* 8.62*  CALCIUM 8.4* 8.1*  AST 30  --   ALT 19  --   ALKPHOS 68  --   BILITOT 0.9  --    ------------------------------------------------------------------------------------------------------------------  Cardiac Enzymes Recent Labs  Lab 03/23/18 1035  TROPONINI <0.03   ------------------------------------------------------------------------------------------------------------------  RADIOLOGY:  [place] Renal  Result Date: 03/22/2018 CLINICAL DATA:  47 year old male with acute kidney injury. EXAM: RENAL / URINARY TRACT ULTRASOUND COMPLETE COMPARISON:  Lumbar MRI 04/05/2013. FINDINGS: Right Kidney: Length: 10.6 centimeters. Echogenicity within normal limits. No mass or hydronephrosis visualized. Left Kidney: Length: 10.5 centimeters. Echogenicity within normal limits. No mass or hydronephrosis visualized. Bladder: Not identified, the patient voided just prior  to imaging. Other findings: Echogenic liver (image 2). IMPRESSION: 1. Normal ultrasound  appearance of both kidneys. Decompressed urinary bladder. 2. Hepatic steatosis. Electronically Signed   By: Odessa Fleming M.D.   On: 03/22/2018 22:32   Dg Chest Port 1 View  Result Date: 03/22/2018 CLINICAL DATA:  Generalize cramping which shortness-of-breath and weakness. Symptoms beginning this morning. EXAM: PORTABLE CHEST 1 VIEW COMPARISON:  07/12/2017 FINDINGS: The heart size and mediastinal contours are within normal limits. Both lungs are clear. The visualized skeletal structures are unremarkable. IMPRESSION: No active disease. Electronically Signed   By: Elberta Fortis M.D.   On: 03/22/2018 17:51    EKG:   Orders placed or performed during the hospital encounter of 03/22/18  . EKG 12-Lead  . EKG 12-Lead  . ED EKG  . ED EKG    ASSESSMENT AND PLAN:   47 year old male with past medical history significant for hypertension, alcohol abuse, GERD presents to hospital secondary to muscle cramps and nausea.  And noted to be in acute renal failure.  1. ARF- cr from jan 2019 is normal, >6 on admission and now at 8.6 -  did not improve with IV fluids, oliguric.  Fluids discontinued. - US renal with no hydronephrosis -Nephrology consulted -Started on hemodialysis today.  Temporary dialysis catheter placement done yesterday. -Serological tests ordered to figure out the cause of renal failure - patient denies taking any NSAIDS  2.  Rhabdomyolysis-with renal failure. -improving CPK -For dialysis today  3. HTN-low normal blood pressure, hold Norvasc and metoprolol  4.  Acute cystitis- UA appears infected, urine cultures pending and started on rocephin  5.  DVT prophylaxis-on subcutaneous heparin   All the records are reviewed and case discussed with Care Management/Social Workerr. Management plans discussed with the patient, family and they are in agreement.  CODE STATUS: Full Code  TOTAL TIME TAKING  CARE OF THIS PATIENT: 36 minutes.   POSSIBLE D/C IN 2-3 DAYS, DEPENDING ON CLINICAL CONDITION.   Sharene Krikorian M.D on 03/24/2018 at 9:35 AM  Between 7am to 6pm - Pager - [phone]  After 6pm go to [email or web address] - Social research officer, government  [facility]  Office  [phone]  CC: Primary care physician; Kerman Passey, MD

## 2018-03-24 NOTE — Progress Notes (Signed)
Pre HD assessment   03/24/18 0953  Vital Signs  Temp 98.6 F (37 C)  Temp Source Oral  Pulse Rate 82  Pulse Rate Source Monitor  Resp 14  BP (!) 98/48  BP Location Right Arm  BP Method Automatic  Patient Position (if appropriate) Lying  Oxygen Therapy  SpO2 97 %  O2 Device Room Air  Pain Assessment  Pain Scale 0-10  Pain Score 2  Pain Type Acute pain  Pain Location Groin  Pain Orientation Left  Patients Stated Pain Goal 0  Pain Intervention(s) RN made aware  Dialysis Weight  Weight 84 kg (185 lb 3 oz)  Type of Weight Pre-Dialysis  Time-Out for Hemodialysis  What Procedure? HD  Pt Identifiers(min of two) First/Last Name;MRN/Account#  Correct Site? Yes  Correct Side? Yes  Correct Procedure? Yes  Consents Verified? Yes  Rad Studies Available? N/A  Safety Precautions Reviewed? Yes  Biochemist, clinicalMachine Checks  Machine Number  ([id number])  Station Number 1  UF/Alarm Test Passed  Conductivity: Meter 14  Conductivity: Machine  14.2  pH 7.4  Reverse Osmosis main  Normal Saline Lot Number [id number]  Dialyzer Lot Number [id number]  Disposable Set Lot Number [id number]  Machine Temperature 98.6 F (37 C)  ImmunologistAir Detector Armed and Audible Yes  Blood Lines Intact and Secured Yes  Pre Treatment Patient Checks  Vascular access used during treatment Catheter  Hepatitis B Surface Antigen Results  (unk)  Date Hepatitis B Surface Antigen Drawn 03/24/18  Hepatitis B Surface Antibody  (unk)  Date Hepatitis B Surface Antibody Drawn 03/24/18  Hemodialysis Consent Verified Yes  Hemodialysis Standing Orders Initiated Yes  ECG (Telemetry) Monitor On Yes  Prime Ordered Normal Saline  Length of  DialysisTreatment -hour(s) 1.5 Hour(s)  Dialyzer Elisio 17H NR  Dialysate 2K, 2.5 Ca  Dialysis Anticoagulant None  Dialysate Flow Ordered 300  Blood Flow Rate Ordered 150 mL/min  Ultrafiltration Goal 0 Liters  Pre Treatment Labs Hepatitis B Surface Antigen;CBC (BMP, Hep Core, HBSAB )  Dialysis Blood Pressure  Support Ordered Normal Saline  Education / Care Plan  Dialysis Education Provided Yes  Documented Education in Care Plan Yes  Hemodialysis Catheter Right Femoral vein Triple-lumen  Placement Date/Time: 03/23/18 1227   Placed prior to admission: No  Person Inserting Catheter: Dr Gilda CreaseSchnier  Orientation: Right  Access Location: Femoral vein  Hemodialysis Catheter Type: Triple-lumen  Site Condition No complications  Blue Lumen Status Heparin locked  Red Lumen Status Heparin locked  Dressing Type Biopatch  Dressing Status Clean;Dry;Intact  Drainage Description None

## 2018-03-24 NOTE — Progress Notes (Signed)
Cathflow ordered, pharmacy contacted, waiting on cathflow per MD orders.    03/24/18 1130  Vital Signs  Resp 14  BP 104/64  Oxygen Therapy  SpO2 97 %  .

## 2018-03-24 NOTE — Progress Notes (Signed)
Cathflow administered, pt will either come down to the dialysis unit or a nurse will come up to his room to remove the cathflow and heparin lock his R-fem cath (temp) after prescribed dwell time.    03/24/18 1250  Vital Signs  Resp 14  Oxygen Therapy  SpO2 96 %

## 2018-03-24 NOTE — Progress Notes (Signed)
Pharmacy called again (2nd attempt) regarding pt medication, pt waiting for medication to be given prior to going back to his room, medication must dwell for at least 1.5-2hrs.    03/24/18 1230  Vital Signs  Pulse Rate 77  Resp 14  BP 112/69  Oxygen Therapy  SpO2 97 %

## 2018-03-25 LAB — CBC
HCT: 38.3 % — ABNORMAL LOW (ref 40.0–52.0)
Hemoglobin: 13.5 g/dL (ref 13.0–18.0)
MCH: 30.2 pg (ref 26.0–34.0)
MCHC: 35.3 g/dL (ref 32.0–36.0)
MCV: 85.8 fL (ref 80.0–100.0)
Platelets: 178 10*3/uL (ref 150–440)
RBC: 4.46 MIL/uL (ref 4.40–5.90)
RDW: 15.1 % — ABNORMAL HIGH (ref 11.5–14.5)
WBC: 9.4 10*3/uL (ref 3.8–10.6)

## 2018-03-25 LAB — PROTEIN ELECTRO, RANDOM URINE
Albumin ELP, Urine: 46.4 %
Alpha-1-Globulin, U: 3.5 %
Alpha-2-Globulin, U: 15.1 %
Beta Globulin, U: 20.5 %
Gamma Globulin, U: 14.4 %
Total Protein, Urine: 160 mg/dL

## 2018-03-25 LAB — BASIC METABOLIC PANEL
Anion gap: 12 (ref 5–15)
BUN: 58 mg/dL — ABNORMAL HIGH (ref 6–20)
CO2: 19 mmol/L — ABNORMAL LOW (ref 22–32)
Calcium: 8.8 mg/dL — ABNORMAL LOW (ref 8.9–10.3)
Chloride: 102 mmol/L (ref 101–111)
Creatinine, Ser: 2.41 mg/dL — ABNORMAL HIGH (ref 0.61–1.24)
GFR calc Af Amer: 35 mL/min — ABNORMAL LOW (ref >60)
GFR calc non Af Amer: 30 mL/min — ABNORMAL LOW (ref >60)
Glucose, Bld: 98 mg/dL (ref 65–99)
Potassium: 3.6 mmol/L (ref 3.5–5.1)
Sodium: 133 mmol/L — ABNORMAL LOW (ref 135–145)

## 2018-03-25 LAB — URINE CULTURE: Culture: NO GROWTH

## 2018-03-25 LAB — HEPATITIS B SURFACE ANTIGEN: Hepatitis B Surface Ag: NEGATIVE

## 2018-03-25 LAB — HEPATITIS B SURFACE ANTIBODY,QUALITATIVE: Hep B S Ab: NONREACTIVE

## 2018-03-25 LAB — HEPATITIS B CORE ANTIBODY, TOTAL: Hep B Core Total Ab: NEGATIVE

## 2018-03-25 MED ORDER — ESOMEPRAZOLE MAGNESIUM 40 MG PO CPDR
40.0000 mg | DELAYED_RELEASE_CAPSULE | Freq: Every day | ORAL | 2 refills | Status: DC
Start: 2018-03-25 — End: 2019-01-20

## 2018-03-25 MED ORDER — PANTOPRAZOLE SODIUM 40 MG PO TBEC
40.0000 mg | DELAYED_RELEASE_TABLET | Freq: Two times a day (BID) | ORAL | Status: DC
  Administered 2018-03-25: 40 mg via ORAL
  Filled 2018-03-25 (×2): qty 1

## 2018-03-25 MED ORDER — CEPHALEXIN 500 MG PO CAPS: 500.0000 mg | ORAL_CAPSULE | Freq: Three times a day (TID) | ORAL | 0 refills | Status: AC

## 2018-03-25 NOTE — Progress Notes (Signed)
Patient cleared for discharge. Temp cath removed. Held pressure for 30-40 mins. Iv Removed. Discharge instructions and prescriptions given. Patient discharged to home via POV.

## 2018-03-25 NOTE — Discharge Instructions (Signed)

## 2018-03-25 NOTE — Progress Notes (Signed)
[facility] [place] [facility]  ROUNDING NOTE   Subjective:  Complaining. Patient was seen prior to discharge. Renal function significantly improved. Urine output yesterday was 1 L.    Objective:  Vital signs in last 24 hours:  Temp:  [98.1 F (36.7 C)-98.5 F (36.9 C)] 98.5 F (36.9 C) (06/21 1202) Pulse Rate:  [73-80] 79 (06/21 1202) Resp:  [16-18] 16 (06/21 1202) BP: (96-136)/(59-81) 136/81 (06/21 1202) SpO2:  [97 %-99 %] 99 % (06/21 1202)  Weight change:  Filed Weights   03/22/18 1707 03/24/18 0953 03/24/18 1130  Weight: 86.2 kg (190 lb) 84 kg (185 lb 3 oz) 84.7 kg (186 lb 11.7 oz)    Intake/Output: I/O last 3 completed shifts: In: 100 [IV Piggyback:100] Out: 8366 [Urine:1000; Other:32]   Intake/Output this shift:  Total I/O In: 580 [P.O.:480; IV Piggyback:100] Out: 600 [Urine:600]  Physical Exam: General: No acute distress  Head: Normocephalic, atraumatic. Moist oral mucosal membranes  Eyes: Anicteric  Neck: Supple, trachea midline  Lungs:  Clear to auscultation, normal effort  Heart: S1S2 no rubs  Abdomen:  Soft, nontender, bowel sounds present  Extremities:  peripheral edema.  Neurologic: Awake, alert, following commands  Skin: No lesions  Access: Right temporary femoral dialysis catheter    Basic Metabolic Panel: Recent Labs  Lab 03/22/18 1730 03/23/18 0542 03/24/18 0504 03/25/18 0515  NA 136 132* 132* 133*  K 3.9 3.8 4.0 3.6  CL 87* 99* 99* 102  CO2 14* 16* 16* 19*  GLUCOSE 223* 122* 94 98  BUN 41* 51* 70* 58*  CREATININE 6.08* 6.55* 8.62* 2.41*  CALCIUM 12.0* 8.4* 8.1* 8.8*  PHOS  --   --  7.4*  --     Liver Function Tests: Recent Labs  Lab 03/22/18 1730 03/23/18 0542  AST 50* 30  ALT 32 19  ALKPHOS 107 68  BILITOT 1.4* 0.9  PROT 10.7* 7.1  ALBUMIN 6.1* 3.9   Recent Labs  Lab 03/22/18 1730 03/23/18 0542  LIPASE 68* 30  AMYLASE  --  107*   No results for input(s): AMMONIA in the last 168 hours.  CBC: Recent Labs  Lab  03/22/18 1730 03/23/18 0542 03/25/18 0515  WBC 19.2* 18.4* 9.4  NEUTROABS 16.6*  --   --   HGB 20.2* 15.7 13.5  HCT 59.1* 45.4 38.3*  MCV 87.7 86.6 85.8  PLT 388 275 178    Cardiac Enzymes: Recent Labs  Lab 03/22/18 1730 03/22/18 1832 03/22/18 2309 03/23/18 0542 03/23/18 1035  CKTOTAL  --  1,368*  --  899*  --   TROPONINI 0.03*  --  0.03* 0.04* <0.03    BNP: Invalid input(s): POCBNP  CBG: No results for input(s): GLUCAP in the last 168 hours.  Microbiology: Results for orders placed or performed during the hospital encounter of 03/22/18  CULTURE, BLOOD (ROUTINE X 2) w Reflex to ID Panel     Status: None (Preliminary result)   Collection Time: 03/22/18 11:09 PM  Result Value Ref Range Status   Specimen Description BLOOD BLOOD RIGHT HAND  Final   Special Requests   Final    BOTTLES DRAWN AEROBIC AND ANAEROBIC Blood Culture adequate volume   Culture   Final    NO GROWTH 3 DAYS Performed at [facility], [address] [place], [place] [address]    Report Status PENDING  Incomplete  CULTURE, BLOOD (ROUTINE X 2) w Reflex to ID Panel     Status: None (Preliminary result)   Collection Time: 03/22/18 11:09  PM  Result Value Ref Range Status   Specimen Description BLOOD BLOOD LEFT HAND  Final   Special Requests   Final    BOTTLES DRAWN AEROBIC AND ANAEROBIC Blood Culture adequate volume   Culture   Final    NO GROWTH 3 DAYS Performed at [facility], [address], [place], [place] [address]    Report Status PENDING  Incomplete  Urine culture     Status: None   Collection Time: 03/23/18  6:39 PM  Result Value Ref Range Status   Specimen Description   Final    URINE, CLEAN CATCH Performed at [facility], [address], [place], [place] [address]    Special Requests   Final    NONE Performed at [facility], [address] [place], [place] [address]    Culture   Final    NO GROWTH Performed at [facility],  [place] [address], [place], [place] [address]    Report Status 03/25/2018 FINAL  Final    Coagulation Studies: No results for input(s): LABPROT, INR in the last 72 hours.  Urinalysis: Recent Labs    03/23/18 1839  COLORURINE AMBER*  LABSPEC 1.018  PHURINE 5.0  GLUCOSEU NEGATIVE  HGBUR SMALL*  BILIRUBINUR NEGATIVE  KETONESUR NEGATIVE  PROTEINUR 100*  NITRITE NEGATIVE  LEUKOCYTESUR NEGATIVE      Imaging: No results found.   Medications:   . cefTRIAXone (ROCEPHIN)  IV Stopped (03/25/18 0915)   . Chlorhexidine Gluconate Cloth  6 each Topical Q0600  . docusate sodium  100 mg Oral BID  . heparin injection (subcutaneous)  5,000 Units Subcutaneous Q8H  . pantoprazole  40 mg Oral BID AC  . tuberculin  5 Units Intradermal Once  . venlafaxine XR  37.5 mg Oral Q breakfast   acetaminophen **OR** acetaminophen, alum & mag hydroxide-simeth, ondansetron **OR** ondansetron (ZOFRAN) IV  Assessment/ Plan:  47 y.o. male with a PMHx of prior history of alcohol abuse, anxiety, depression, GERD, hypertension, hyperlipidemia, who was admitted to [facility] on 03/22/2018 for evaluation of muscle cramping.    1.  Acute renal failure suspect secondary to volume loss from sweating as well as diarrhea. 2.  Hyponatremia. 3.  Mild rhabdomyolysis. 4.  Metabolic acidosis.  Plan: Patient significantly improved.  His extensive blood work was reviewed today and patient was found to have negative ANA, ANCA antibodies, GBM antibodies and normal complement levels.  Renal ultrasound was also unremarkable.  His renal function was improved significantly as creatinine was down to 2.4.  Serum sodium has also improved and is currently 133.  I counseled the patient to maintain adequate fluid hydration status over the weekend.  I have also advised him to avoid any NSAIDs over the weekend.  He verbalized understanding of this.  We do plan to see the patient back in the office in 1 week to repeat his renal parameters.  Patient  is safe for discharge today.    LOS: 3 Casia Corti 6/21/20194:02 PM

## 2018-03-25 NOTE — Discharge Summary (Signed)
[employer] [facility] [employer] [facility]   PATIENT NAME: [name] RoHerbert Scicchitano    MR#:  [id number]  DATE OF BIRTH:  10/05/1970  DATE OF ADMISSION:  03/22/2018   ADMITTING PHYSICIAN: Ramonita LabAruna Gouru, MD  DATE OF DISCHARGE:  03/25/18  PRIMARY CARE PHYSICIAN: Kerman PasseyLada, [name], MD   ADMISSION DIAGNOSIS:   Dehydration [E86.0] AKI (acute kidney injury) (HCC) [N17.9] Acute kidney injury (HCC) [N17.9] Non-traumatic rhabdomyolysis [M62.82]  DISCHARGE DIAGNOSIS:   Active Problems:   AKI (acute kidney injury) (HCC)   SECONDARY DIAGNOSIS:   Past Medical History:  Diagnosis Date  . Alcohol abuse, in remission   . Anxiety   . Arthritis   . Avascular necrosis (HCC)   . Depression   . GERD (gastroesophageal reflux disease)   . History of alcohol abuse   . Hyperlipidemia   . Hypertension   . IFG (impaired fasting glucose)   . Overweight     HOSPITAL COURSE:   47 year old male with past medical history significant for hypertension, alcohol abuse, GERD presents to hospital secondary to muscle cramps and nausea.  And noted to be in acute renal failure.  1. ARF- cr from jan 2019 is normal,  - was >6 on admission and increased up to 8.6 -  did not improve with IV fluids. So started on hemodialysis and had 1 session after a Temporary dialysis catheter placed - US renal with no hydronephrosis -Nephrology consulted -Serological tests ordered to figure out the cause of renal failure - patient denies taking any NSAIDS -After his first session of dialysis, his urine output has significantly improved and his creatinine dropped from 8 to 2.4 -Patient is eager to be discharged.  Will discharge and outpatient follow-up in less than a week with nephrology.  Advised to come back to the ER or get attention from his PCP if his urine output decreases or becomes anuric  2.  Rhabdomyolysis-with renal failure. -Had one session of dialysis  3. HTN-low normal blood pressure, hold Norvasc and  metoprolol at discharge  4.  Acute cystitis- UA appears infected, urine cultures pending  -on rocephin-started on Keflex  5.  GERD- Complaints of significant reflux symptoms-started on PPI  6.  Depression-continue Effexor  Stable and anxious to be discharged.    DISCHARGE CONDITIONS:   Guarded  CONSULTS OBTAINED:   Treatment Team:  Mady HaagensenLateef, Munsoor, MD  DRUG ALLERGIES:   Allergies  Allergen Reactions  . Ace Inhibitors Anaphylaxis  . Captopril Anaphylaxis  . Enalapril Anaphylaxis  . Fosinopril Anaphylaxis  . Lisinopril Anaphylaxis  . Ramipril Anaphylaxis   DISCHARGE MEDICATIONS:   Allergies as of 03/25/2018      Reactions   Ace Inhibitors Anaphylaxis   Captopril Anaphylaxis   Enalapril Anaphylaxis   Fosinopril Anaphylaxis   Lisinopril Anaphylaxis   Ramipril Anaphylaxis      Medication List    STOP taking these medications   amLODipine 10 MG tablet Commonly known as:  NORVASC   DEXILANT 30 MG capsule Generic drug:  Dexlansoprazole Replaced by:  esomeprazole 40 MG capsule   metoprolol succinate 100 MG 24 hr tablet Commonly known as:  TOPROL-XL   potassium chloride SA 20 MEQ tablet Commonly known as:  K-DUR,KLOR-CON     TAKE these medications   aspirin EC 81 MG tablet Take 81 mg by mouth daily.   atorvastatin 20 MG tablet Commonly known as:  LIPITOR Take 1 tablet (20 mg total) by mouth at bedtime.   cephALEXin 500 MG capsule Commonly known  as:  KEFLEX Take 1 capsule (500 mg total) by mouth 3 (three) times daily for 7 days.   esomeprazole 40 MG capsule Commonly known as:  NEXIUM Take 1 capsule (40 mg total) by mouth daily. Replaces:  DEXILANT 30 MG capsule   multivitamin with minerals Tabs tablet Take 1 tablet by mouth daily.   venlafaxine XR 37.5 MG 24 hr capsule Commonly known as:  EFFEXOR-XR Take 1 capsule (37.5 mg total) by mouth daily with breakfast. appt needed for further refills        DISCHARGE INSTRUCTIONS:   1.   Nephrology follow-up in 1 week 2.  PCP follow-up in 1 week  DIET:   Cardiac diet  ACTIVITY:   Activity as tolerated  OXYGEN:   Home Oxygen: No.  Oxygen Delivery: room air  DISCHARGE LOCATION:   home   If you experience worsening of your admission symptoms, develop shortness of breath, life threatening emergency, suicidal or homicidal thoughts you must seek medical attention immediately by calling 911 or calling your MD immediately  if symptoms less severe.  You Must read complete instructions/literature along with all the possible adverse reactions/side effects for all the Medicines you take and that have been prescribed to you. Take any new Medicines after you have completely understood and accpet all the possible adverse reactions/side effects.   Please note  You were cared for by a hospitalist during your hospital stay. If you have any questions about your discharge medications or the care you received while you were in the hospital after you are discharged, you can call the unit and asked to speak with the hospitalist on call if the hospitalist that took care of you is not available. Once you are discharged, your primary care physician will handle any further medical issues. Please note that NO REFILLS for any discharge medications will be authorized once you are discharged, as it is imperative that you return to your primary care physician (or establish a relationship with a primary care physician if you do not have one) for your aftercare needs so that they can reassess your need for medications and monitor your lab values.    On the day of Discharge:  VITAL SIGNS:   Blood pressure 136/81, pulse 79, temperature 98.5 F (36.9 C), temperature source Oral, resp. rate 16, height 5\' 9"  (1.753 m), weight 84.7 kg (186 lb 11.7 oz), SpO2 99 %.  PHYSICAL EXAMINATION:    GENERAL:  47 y.o.-year-old patient lying in the bed with no acute distress.  EYES: Pupils equal, round, reactive  to light and accommodation. No scleral icterus. Extraocular muscles intact.  HEENT: Head atraumatic, normocephalic. Oropharynx and nasopharynx clear.  NECK:  Supple, no jugular venous distention. No thyroid enlargement, no tenderness.  LUNGS: Normal breath sounds bilaterally, no wheezing, rales,rhonchi or crepitation. No use of accessory muscles of respiration.  CARDIOVASCULAR: S1, S2 normal. No murmurs, rubs, or gallops.  ABDOMEN: Soft, nontender, nondistended. Bowel sounds present. No organomegaly or mass.  EXTREMITIES: No pedal edema, cyanosis, or clubbing.  -Right groin catheter present.  No bleeding or swelling noted NEUROLOGIC: Cranial nerves II through XII are intact. Muscle strength 5/5 in all extremities. Sensation intact. Gait not checked.  PSYCHIATRIC: The patient is alert and oriented x 3.  SKIN: No obvious rash, lesion, or ulcer.     DATA REVIEW:   CBC Recent Labs  Lab 03/25/18 0515  WBC 9.4  HGB 13.5  HCT 38.3*  PLT 178    Chemistries  Recent Labs  Lab 03/23/18 0542  03/25/18 0515  NA 132*   < > 133*  K 3.8   < > 3.6  CL 99*   < > 102  CO2 16*   < > 19*  GLUCOSE 122*   < > 98  BUN 51*   < > 58*  CREATININE 6.55*   < > 2.41*  CALCIUM 8.4*   < > 8.8*  AST 30  --   --   ALT 19  --   --   ALKPHOS 68  --   --   BILITOT 0.9  --   --    < > = values in this interval not displayed.     Microbiology Results  Results for orders placed or performed during the hospital encounter of 03/22/18  CULTURE, BLOOD (ROUTINE X 2) w Reflex to ID Panel     Status: None (Preliminary result)   Collection Time: 03/22/18 11:09 PM  Result Value Ref Range Status   Specimen Description BLOOD BLOOD RIGHT HAND  Final   Special Requests   Final    BOTTLES DRAWN AEROBIC AND ANAEROBIC Blood Culture adequate volume   Culture   Final    NO GROWTH 3 DAYS Performed at [facility], [address], [place], [place] [address]    Report Status PENDING  Incomplete  CULTURE,  BLOOD (ROUTINE X 2) w Reflex to ID Panel     Status: None (Preliminary result)   Collection Time: 03/22/18 11:09 PM  Result Value Ref Range Status   Specimen Description BLOOD BLOOD LEFT HAND  Final   Special Requests   Final    BOTTLES DRAWN AEROBIC AND ANAEROBIC Blood Culture adequate volume   Culture   Final    NO GROWTH 3 DAYS Performed at [facility], [address], [place], [place] [address]    Report Status PENDING  Incomplete  Urine culture     Status: None   Collection Time: 03/23/18  6:39 PM  Result Value Ref Range Status   Specimen Description   Final    URINE, CLEAN CATCH Performed at [facility], [address] [place], [place] [address]    Special Requests   Final    NONE Performed at [facility], [address], [place], [place] [address]    Culture   Final    NO GROWTH Performed at [facility], [address] [place], [place] [address]    Report Status 03/25/2018 FINAL  Final    RADIOLOGY:  No results found.   Management plans discussed with the patient, family and they are in agreement.  CODE STATUS:     Code Status Orders  (From admission, onward)        Start     Ordered   03/22/18 2248  Full code  Continuous     03/22/18 2247    Code Status History    Date Active Date Inactive Code Status Order ID Comments User Context   07/12/2017 1543 07/12/2017 2201 Full Code [id number]  John Giovanni, MD Inpatient   07/12/2017 0913 07/12/2017 1543 Full Code [id number]  Gwyneth Sprout, MD ED   02/19/2016 1622 02/20/2016 1725 Full Code [id number]  Hower, Cletis Athens, MD ED   01/30/2014 1202 01/31/2014 1253 Full Code [id number]  Shelda Pal, MD Inpatient      TOTAL TIME TAKING CARE OF THIS PATIENT: 38 minutes.    Enid Baas M.D on 03/25/2018 at 3:07 PM  Between 7am  to 6pm - Pager - [phone]  After 6pm go to [email or web address] - Social research officer, government  [facility]  Office   [phone]  CC: Primary care physician; Kerman Passey, MD   Note: This dictation was prepared with Dragon dictation along with smaller phrase technology. Any transcriptional errors that result from this process are unintentional.

## 2018-03-25 NOTE — Progress Notes (Signed)
[employer] - [place] at [facility]   PATIENT NAME: [name] White    MR#:  [id number]  DATE OF BIRTH:  September 11, 1971  SUBJECTIVE:  CHIEF COMPLAINT:   Chief Complaint  Patient presents with  . Abdominal Cramping  . Shortness of Breath  . Weakness   -Feeling much better today.  Had his first session of dialysis yesterday.  Urine output improved.  Voided almost 1 L in the last 24 hours.  Creatinine is much better today.  REVIEW OF SYSTEMS:  Review of Systems  Constitutional: Negative for chills, fever and malaise/fatigue.  HENT: Negative for congestion, ear discharge, hearing loss and nosebleeds.   Eyes: Negative for blurred vision and double vision.  Respiratory: Negative for cough, shortness of breath and wheezing.   Cardiovascular: Negative for chest pain and palpitations.  Gastrointestinal: Negative for abdominal pain, constipation, diarrhea, nausea and vomiting.  Genitourinary: Negative for dysuria.  Musculoskeletal: Negative for myalgias.  Neurological: Negative for dizziness, focal weakness, seizures, weakness and headaches.  Psychiatric/Behavioral: Negative for depression.    DRUG ALLERGIES:   Allergies  Allergen Reactions  . Ace Inhibitors Anaphylaxis  . Captopril Anaphylaxis  . Enalapril Anaphylaxis  . Fosinopril Anaphylaxis  . Lisinopril Anaphylaxis  . Ramipril Anaphylaxis    VITALS:  Blood pressure (!) 96/59, pulse 73, temperature 98.1 F (36.7 C), temperature source Oral, resp. rate 17, height 5\' 9"  (1.753 m), weight 84.7 kg (186 lb 11.7 oz), SpO2 97 %.  PHYSICAL EXAMINATION:  Physical Exam  GENERAL:  47 y.o.-year-old patient lying in the bed with no acute distress.  EYES: Pupils equal, round, reactive to light and accommodation. No scleral icterus. Extraocular muscles intact.  HEENT: Head atraumatic, normocephalic. Oropharynx and nasopharynx clear.  NECK:  Supple, no jugular venous distention. No thyroid enlargement, no tenderness.    LUNGS: Normal breath sounds bilaterally, no wheezing, rales,rhonchi or crepitation. No use of accessory muscles of respiration.  CARDIOVASCULAR: S1, S2 normal. No murmurs, rubs, or gallops.  ABDOMEN: Soft, nontender, nondistended. Bowel sounds present. No organomegaly or mass.  EXTREMITIES: No pedal edema, cyanosis, or clubbing.  -Right groin catheter present.  No bleeding or swelling noted NEUROLOGIC: Cranial nerves II through XII are intact. Muscle strength 5/5 in all extremities. Sensation intact. Gait not checked.  PSYCHIATRIC: The patient is alert and oriented x 3.  SKIN: No obvious rash, lesion, or ulcer.    LABORATORY PANEL:   CBC Recent Labs  Lab 03/25/18 0515  WBC 9.4  HGB 13.5  HCT 38.3*  PLT 178   ------------------------------------------------------------------------------------------------------------------  Chemistries  Recent Labs  Lab 03/23/18 0542  03/25/18 0515  NA 132*   < > 133*  K 3.8   < > 3.6  CL 99*   < > 102  CO2 16*   < > 19*  GLUCOSE 122*   < > 98  BUN 51*   < > 58*  CREATININE 6.55*   < > 2.41*  CALCIUM 8.4*   < > 8.8*  AST 30  --   --   ALT 19  --   --   ALKPHOS 68  --   --   BILITOT 0.9  --   --    < > = values in this interval not displayed.   ------------------------------------------------------------------------------------------------------------------  Cardiac Enzymes Recent Labs  Lab 03/23/18 1035  TROPONINI <0.03   ------------------------------------------------------------------------------------------------------------------  RADIOLOGY:  No results found.  EKG:   Orders placed or performed during the hospital encounter of 03/22/18  .  EKG 12-Lead  . EKG 12-Lead  . ED EKG  . ED EKG    ASSESSMENT AND PLAN:   47 year old male with past medical history significant for hypertension, alcohol abuse, GERD presents to hospital secondary to muscle cramps and nausea.  And noted to be in acute renal failure.  1. ARF-  cr from jan 2019 is normal, >6 on admission and increased up to 8.6 -  did not improve with IV fluids. So started on hemodialysis and had 1 session.  Temporary dialysis catheter placed - US renal with no hydronephrosis -Nephrology consulted -Serological tests ordered to figure out the cause of renal failure - patient denies taking any NSAIDS -Urine output has improved.  Might not need dialysis.  Discussed with nephrology if monitoring today without dialysis will be the plan  2.  Rhabdomyolysis-with renal failure. -improving CPK -Had one session of dialysis  3. HTN-low normal blood pressure, hold Norvasc and metoprolol  4.  Acute cystitis- UA appears infected, urine cultures pending and started on rocephin  5.  DVT prophylaxis-on subcutaneous heparin  6.  GERD- Complaints of significant reflux symptoms-started on Protonix twice daily   All the records are reviewed and case discussed with Care Management/Social Workerr. Management plans discussed with the patient, family and they are in agreement.  CODE STATUS: Full Code  TOTAL TIME TAKING CARE OF THIS PATIENT: 36 minutes.   POSSIBLE D/C IN 1-2 DAYS, DEPENDING ON CLINICAL CONDITION.   Enid BaasKALISETTI,[name] M.D on 03/25/2018 at 9:17 AM  Between 7am to 6pm - Pager - [phone]  After 6pm go to [email or web address] - Social research officer, governmentpassword EPAS [facility]  [facility] [place] [facility]  Office  [phone]  CC: Primary care physician; Kerman PasseyLada, [name], MD

## 2018-03-27 LAB — CULTURE, BLOOD (ROUTINE X 2)
Culture: NO GROWTH
Culture: NO GROWTH
Special Requests: ADEQUATE
Special Requests: ADEQUATE

## 2018-08-02 ENCOUNTER — Emergency Department
Admission: EM | Admit: 2018-08-02 | Discharge: 2018-08-02 | Discharge status: Against Medical Advice | Payer: Managed Care, Other (non HMO)

## 2018-08-02 NOTE — ED Notes (Signed)
Pt called in the WR with no response 

## 2018-08-02 NOTE — ED Notes (Signed)
Pt family member in the WR looking for the pt. Pt is not noted to be present in the WR.Marland Kitchen

## 2018-08-02 NOTE — ED Notes (Signed)
First nurse note; per EMS: pt drinking this am, unresponsive upon arrival per family, and they  wanted to get him "checked out." Hx etoh abuse 174/96; 84; 16; Was to go to rehab today.

## 2018-11-11 IMAGING — CT CT HEAD CODE STROKE
4 series · 16 of 47 positions shown, 18 images · non-contrast
Comparison: None.

CLINICAL DATA: Code stroke. 46-year-old male with slurred speech.
Last seen normal 7177 hours.

EXAM:
CT HEAD WITHOUT CONTRAST
TECHNIQUE: Contiguous axial images were obtained from the base of the skull
through the vertex without intravenous contrast.

[Series 3: head wo · axial · 0.43mm/px · z∈[-106,+18]mm · 7 of 35 slices shown, 9 images]
[im 5/35  brain]
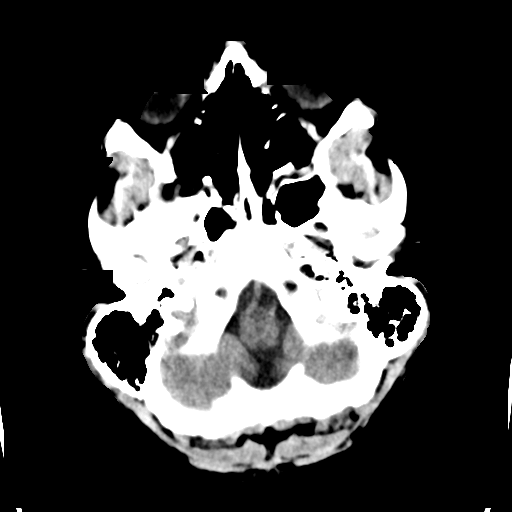
[im 5/35  bone]
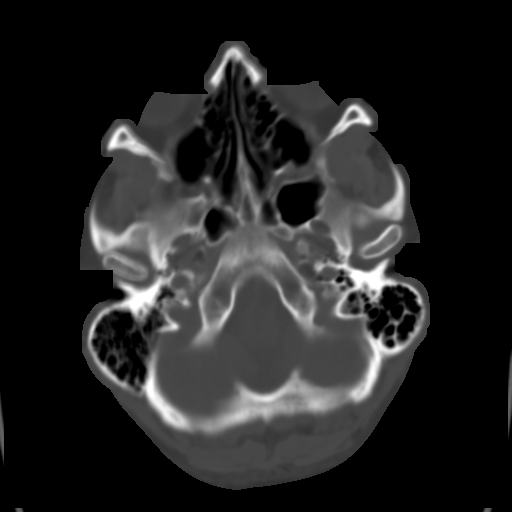
[im 9/35  brain]
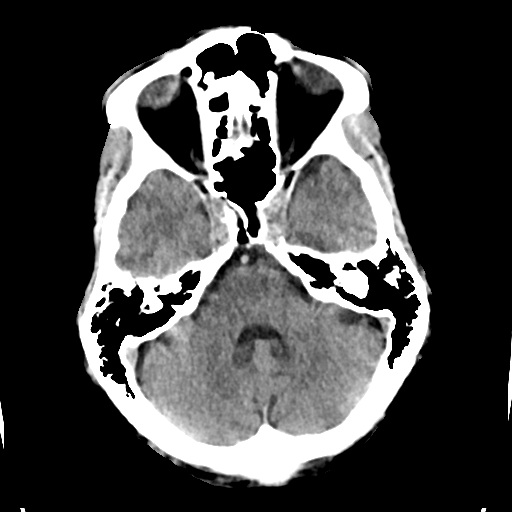
[im 13/35  brain]
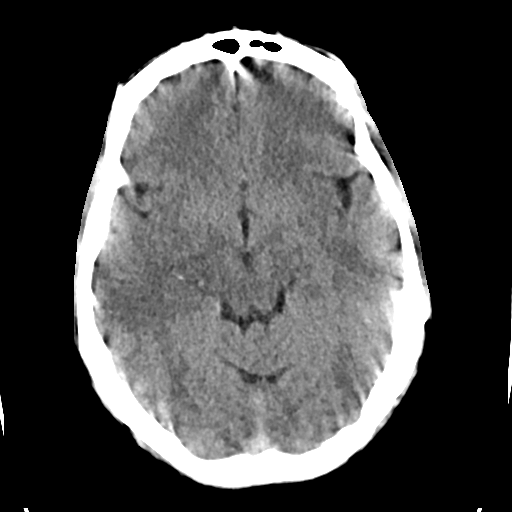
[im 18/35  brain]
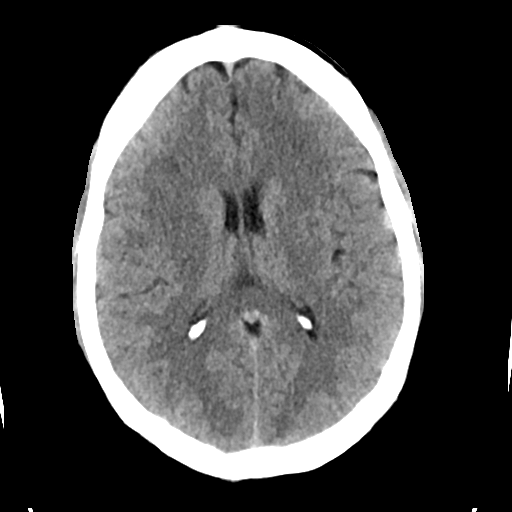
[im 22/35  brain]
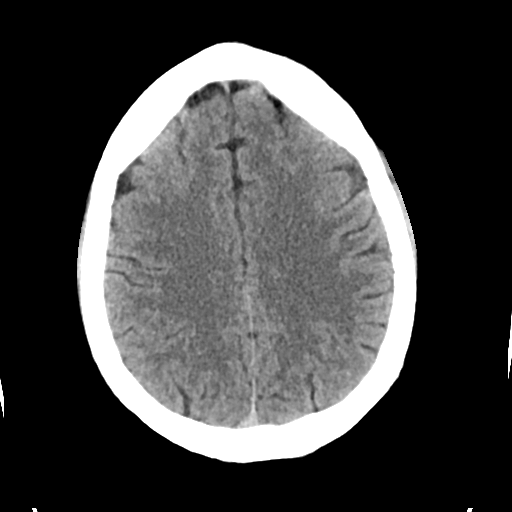
[im 22/35  bone]
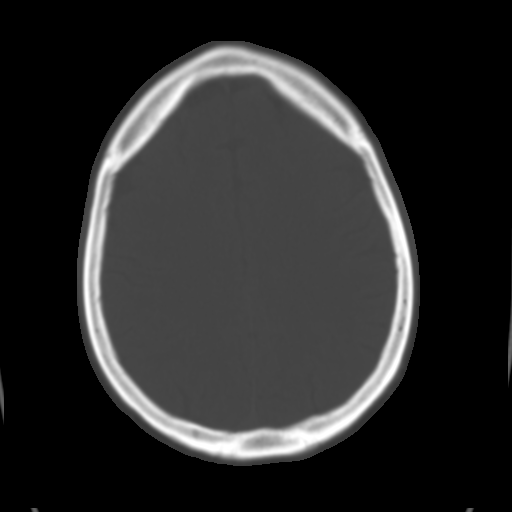
[im 26/35  brain]
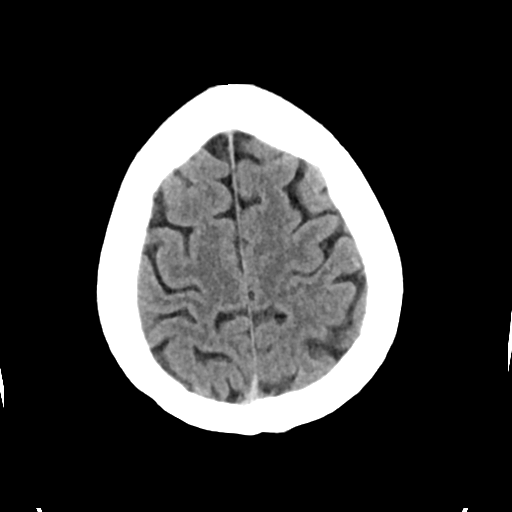
[im 30/35  brain]
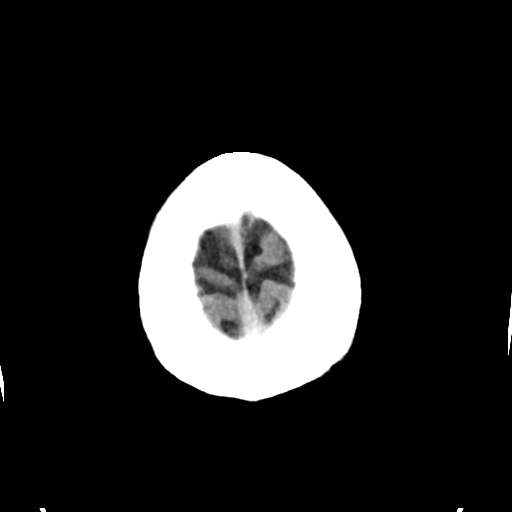

[Series 4: head bone · axial · 0.43mm/px · z∈[-110,-76]mm · 3 of 87 slices shown]
[im 9/87  bone]
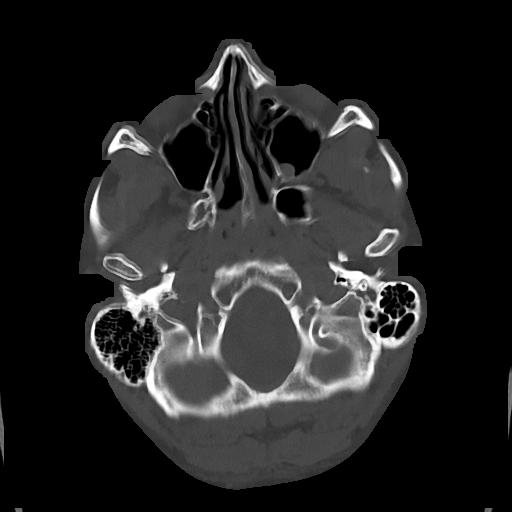
[im 18/87  bone]
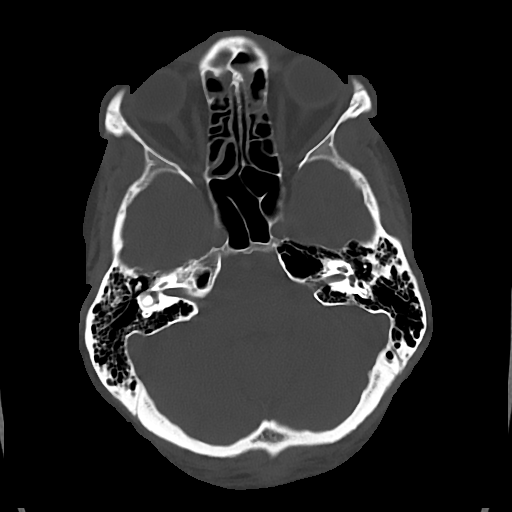
[im 26/87  bone]
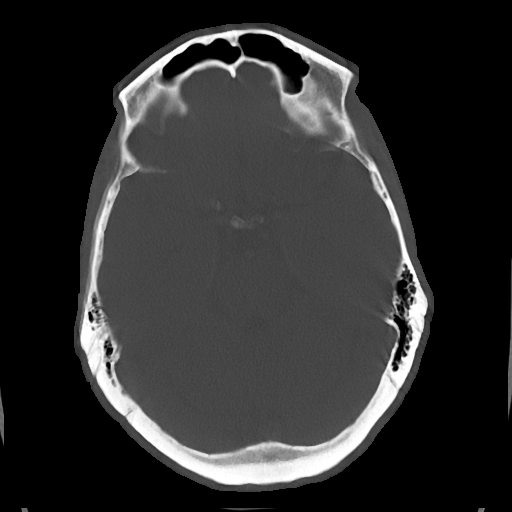

[Series 5: cor soft · coronal · 0.34mm/px · 3 of 73 slices shown]
[im 25/73  brain]
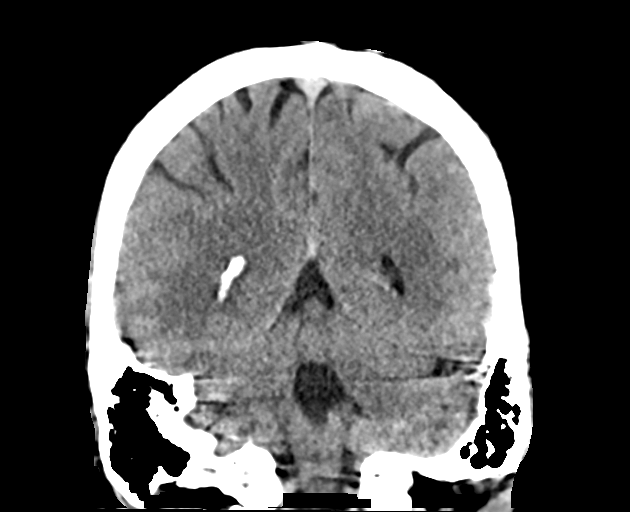
[im 33/73  brain]
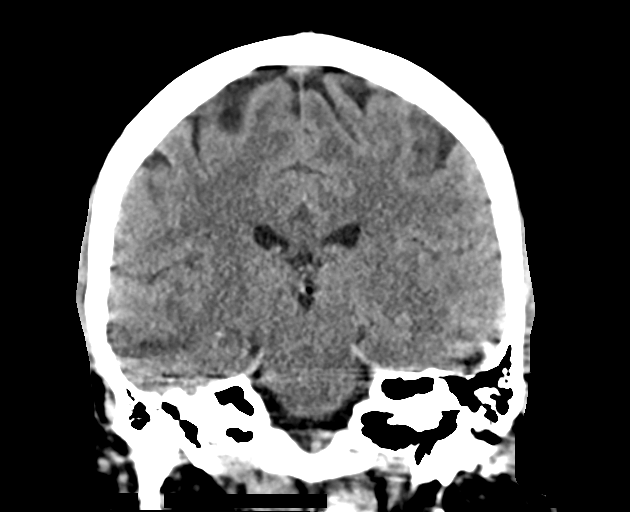
[im 41/73  brain]
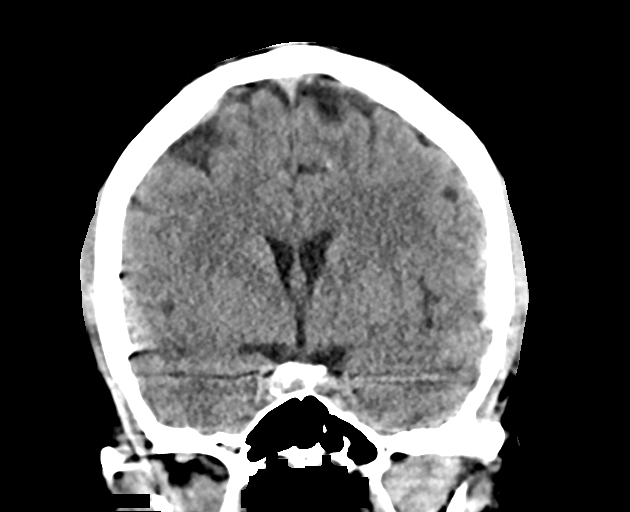

[Series 6: sag soft · sagittal · 0.34mm/px · 3 of 67 slices shown]
[im 23/67  brain]
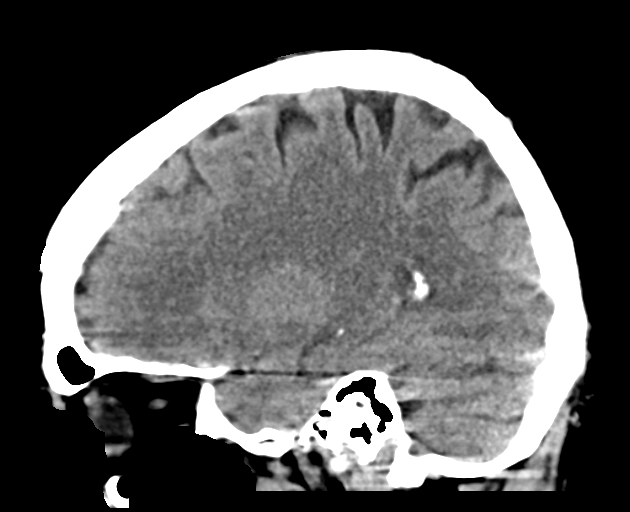
[im 34/67  brain]
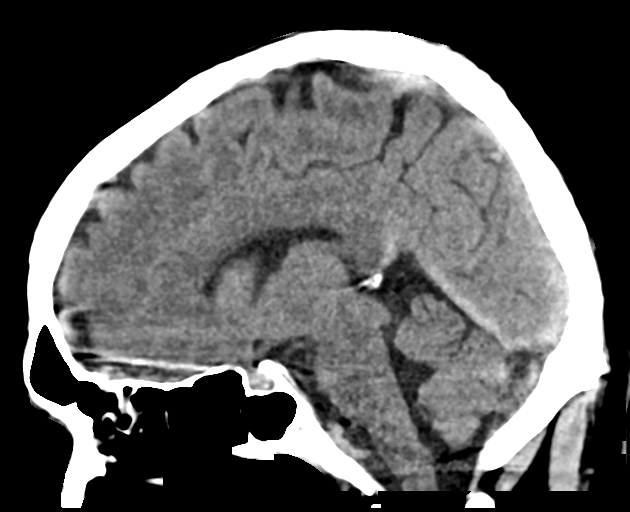
[im 45/67  brain]
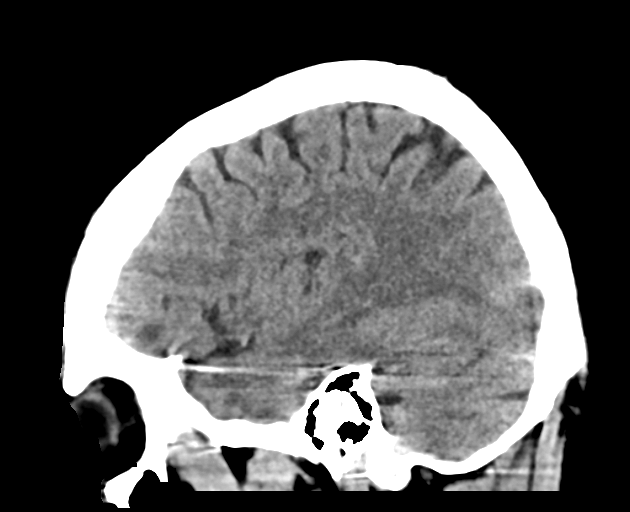

[16 of 47 positions shown; findings below may reference images not displayed]

FINDINGS: Brain: Mild motion artifact. Cerebral volume is normal. No midline
shift, ventriculomegaly, mass effect, evidence of mass lesion,
intracranial hemorrhage or evidence of cortically based acute
infarction. Gray-white matter differentiation is within normal
limits throughout the brain. No suspicious intracranial vascular
hyperdensity.

Vascular: Mild Calcified atherosclerosis at the skull base.

Skull: No acute osseous abnormality identified.

Sinuses/Orbits: Well pneumatized overall. Mild ethmoid mucosal
thickening.

Other: No acute orbit or scalp soft tissue findings.

ASPECTS (Alberta Stroke Program Early CT Score)

- Ganglionic level infarction (caudate, lentiform nuclei, internal
capsule, insula, M1-M3 cortex): 7

- Supraganglionic infarction (M4-M6 cortex): 3

Total score (0-10 with 10 being normal): 10
IMPRESSION: 1. Normal noncontrast CT appearance of the brain.
2. ASPECTS is 10.
3. The above was relayed via text pager to Dr. Mudah on 07/12/2017 at

## 2018-11-12 ENCOUNTER — Telehealth: Payer: Self-pay | Admitting: Family Medicine

## 2018-11-13 NOTE — Telephone Encounter (Signed)
Patient needs an appointment ASAP He has not been seen in over a year Chart shows dialysis catheter, so he's obviously had major health issues since last visit Medicine requested was metoprolol which has been REMOVED from him medicine list, so he needs an appointment ASAP  Discharge summary from March 25, 2018:   STOP taking these medications   amLODipine 10 MG tablet Commonly known as:  NORVASC   DEXILANT 30 MG capsule Generic drug:  Dexlansoprazole Replaced by:  esomeprazole 40 MG capsule   metoprolol succinate 100 MG 24 hr tablet Commonly known as:  TOPROL-XL   potassium chloride SA 20 MEQ tablet Commonly known as:  K-DUR,KLOR-CON     Me or [name] White

## 2018-11-14 NOTE — Telephone Encounter (Signed)
Spoke with pt and he stated that he will return our call this afternoon to schedule appt. Stated that he begins a new job today and need to [employer] representative. He did ask if you could please give him just one refill.

## 2018-11-14 NOTE — Telephone Encounter (Signed)
The issue is that the hospitalist stopped this medicine According to the last discharge summary, he isn't supposed to be taking metoprolol If he needs a refill today, he'll need to go to an urgent care  Again, we've not seen him in over a year The metoprolol he is requesting was STOPPED more than 7 months ago and he never came in for hospital follow-up

## 2018-11-14 NOTE — Telephone Encounter (Signed)
Pt.notified

## 2018-11-28 ENCOUNTER — Ambulatory Visit: Payer: Managed Care, Other (non HMO) | Admitting: Family Medicine

## 2018-12-02 ENCOUNTER — Ambulatory Visit: Payer: Self-pay | Admitting: Family Medicine

## 2018-12-09 ENCOUNTER — Other Ambulatory Visit: Payer: Self-pay

## 2018-12-09 ENCOUNTER — Encounter: Payer: Self-pay | Admitting: Emergency Medicine

## 2018-12-09 ENCOUNTER — Emergency Department
Admission: EM | Admit: 2018-12-09 | Discharge: 2018-12-10 | Disposition: A | Discharge status: Home / Self Care | Payer: Managed Care, Other (non HMO) | Attending: Emergency Medicine | Admitting: Emergency Medicine

## 2018-12-09 DIAGNOSIS — I10 Essential (primary) hypertension: Secondary | ICD-10-CM | POA: Insufficient documentation

## 2018-12-09 DIAGNOSIS — F10239 Alcohol dependence with withdrawal, unspecified: Secondary | ICD-10-CM | POA: Insufficient documentation

## 2018-12-09 DIAGNOSIS — F1010 Alcohol abuse, uncomplicated: Secondary | ICD-10-CM

## 2018-12-09 DIAGNOSIS — F17210 Nicotine dependence, cigarettes, uncomplicated: Secondary | ICD-10-CM | POA: Insufficient documentation

## 2018-12-09 DIAGNOSIS — Z79899 Other long term (current) drug therapy: Secondary | ICD-10-CM | POA: Insufficient documentation

## 2018-12-09 LAB — URINE DRUG SCREEN, QUALITATIVE (ARMC ONLY)
Amphetamines, Ur Screen: NOT DETECTED
Barbiturates, Ur Screen: NOT DETECTED
Benzodiazepine, Ur Scrn: NOT DETECTED
Cannabinoid 50 Ng, Ur ~~LOC~~: NOT DETECTED
Cocaine Metabolite,Ur ~~LOC~~: NOT DETECTED
MDMA (Ecstasy)Ur Screen: NOT DETECTED
Methadone Scn, Ur: NOT DETECTED
Opiate, Ur Screen: NOT DETECTED
Phencyclidine (PCP) Ur S: NOT DETECTED
Tricyclic, Ur Screen: NOT DETECTED

## 2018-12-09 LAB — COMPREHENSIVE METABOLIC PANEL
ALT: 35 U/L (ref 0–44)
AST: 47 U/L — ABNORMAL HIGH (ref 15–41)
Albumin: 4.8 g/dL (ref 3.5–5.0)
Alkaline Phosphatase: 88 U/L (ref 38–126)
Anion gap: 21 — ABNORMAL HIGH (ref 5–15)
BUN: 17 mg/dL (ref 6–20)
CO2: 18 mmol/L — ABNORMAL LOW (ref 22–32)
Calcium: 9 mg/dL (ref 8.9–10.3)
Chloride: 97 mmol/L — ABNORMAL LOW (ref 98–111)
Creatinine, Ser: 0.84 mg/dL (ref 0.61–1.24)
GFR calc Af Amer: >60 mL/min (ref >60)
GFR calc non Af Amer: >60 mL/min (ref >60)
Glucose, Bld: 139 mg/dL — ABNORMAL HIGH (ref 70–99)
Potassium: 3.1 mmol/L — ABNORMAL LOW (ref 3.5–5.1)
Sodium: 136 mmol/L (ref 135–145)
Total Bilirubin: 1.2 mg/dL (ref 0.3–1.2)
Total Protein: 8.2 g/dL — ABNORMAL HIGH (ref 6.5–8.1)

## 2018-12-09 LAB — ETHANOL: Alcohol, Ethyl (B): 146 mg/dL — ABNORMAL HIGH (ref <10)

## 2018-12-09 LAB — CBC
HCT: 46.8 % (ref 39.0–52.0)
Hemoglobin: 16.7 g/dL (ref 13.0–17.0)
MCH: 29.5 pg (ref 26.0–34.0)
MCHC: 35.7 g/dL (ref 30.0–36.0)
MCV: 82.5 fL (ref 80.0–100.0)
Platelets: 268 10*3/uL (ref 150–400)
RBC: 5.67 MIL/uL (ref 4.22–5.81)
RDW: 13.7 % (ref 11.5–15.5)
WBC: 7.5 10*3/uL (ref 4.0–10.5)
nRBC: 0 % (ref 0.0–0.2)

## 2018-12-09 LAB — SALICYLATE LEVEL: Salicylate Lvl: <7 mg/dL (ref 2.8–30.0)

## 2018-12-09 LAB — ACETAMINOPHEN LEVEL: Acetaminophen (Tylenol), Serum: <10 ug/mL — ABNORMAL LOW (ref 10–30)

## 2018-12-09 MED ORDER — LORAZEPAM 2 MG/ML IJ SOLN
2.0000 mg | Freq: Once | INTRAMUSCULAR | Status: AC
  Administered 2018-12-09: 2 mg via INTRAVENOUS
  Filled 2018-12-09: qty 1

## 2018-12-09 MED ORDER — LORAZEPAM 2 MG PO TABS
0.0000 mg | ORAL_TABLET | Freq: Four times a day (QID) | ORAL | Status: DC
  Administered 2018-12-09: 2 mg via ORAL
  Administered 2018-12-10: 1 mg via ORAL
  Filled 2018-12-09 (×2): qty 1

## 2018-12-09 MED ORDER — LORAZEPAM 2 MG PO TABS: 0.0000 mg | ORAL_TABLET | Freq: Two times a day (BID) | ORAL | Status: DC

## 2018-12-09 MED ORDER — LORAZEPAM 2 MG/ML IJ SOLN: 0.0000 mg | Freq: Four times a day (QID) | INTRAMUSCULAR | Status: DC

## 2018-12-09 MED ORDER — LORAZEPAM 1 MG PO TABS
1.0000 mg | ORAL_TABLET | Freq: Once | ORAL | Status: AC
  Administered 2018-12-09: 1 mg via ORAL
  Filled 2018-12-09: qty 1

## 2018-12-09 MED ORDER — VITAMIN B-1 100 MG PO TABS
100.0000 mg | ORAL_TABLET | Freq: Every day | ORAL | Status: DC
  Administered 2018-12-09 – 2018-12-10 (×2): 100 mg via ORAL
  Filled 2018-12-09 (×2): qty 1

## 2018-12-09 MED ORDER — SODIUM CHLORIDE 0.9 % IV BOLUS
1000.0000 mL | Freq: Once | INTRAVENOUS | Status: AC
  Administered 2018-12-09: 1000 mL via INTRAVENOUS

## 2018-12-09 MED ORDER — THIAMINE HCL 100 MG/ML IJ SOLN: 100.0000 mg | Freq: Every day | INTRAMUSCULAR | Status: DC

## 2018-12-09 MED ORDER — LORAZEPAM 2 MG/ML IJ SOLN: 0.0000 mg | Freq: Two times a day (BID) | INTRAMUSCULAR | Status: DC

## 2018-12-09 MED ORDER — NICOTINE 21 MG/24HR TD PT24
21.0000 mg | MEDICATED_PATCH | Freq: Every day | TRANSDERMAL | Status: DC
  Administered 2018-12-09: 21 mg via TRANSDERMAL
  Filled 2018-12-09: qty 1

## 2018-12-09 MED ORDER — METOPROLOL SUCCINATE ER 50 MG PO TB24
100.0000 mg | ORAL_TABLET | Freq: Every day | ORAL | Status: DC
  Administered 2018-12-09 – 2018-12-10 (×2): 100 mg via ORAL
  Filled 2018-12-09 (×2): qty 2

## 2018-12-09 NOTE — ED Notes (Signed)
This tech taking over care as rover in ED Quad, this tech introduced to pt by previous EDT Felicia, this tech will continue q 15 min checks 

## 2018-12-09 NOTE — ED Notes (Signed)
[facility] called back to [facility] and questioned if the pt had an indwelling dialysis catheter  - I asked the pt and he stated  No  RTS wanted to know if the pt has insurance and the pt verbalized to me that he does not have insurance   Information provided to [facility] by TTS   We will continue to await detox placement

## 2018-12-09 NOTE — ED Notes (Signed)
Pt resting quietly in his room at this time, pt bed lowered to lowest position, expresses no concerns, offered warm blanket,pt reminded where the restroom was if the need arises,pt offered Beverages, pt has no needs at this time, will continue to do q 15 min checks  

## 2018-12-09 NOTE — BH Assessment (Addendum)
Patient's information faxed to the following facilities for treatment:   [facility]:  under review      [facility]     [facility]     [facility]

## 2018-12-09 NOTE — ED Notes (Signed)
"  I need my BP medicine cause I have not had it in two weeks - my insurance has not started yet and I ran out of medicine  - that doctor wants 150 dollars to see me and give me a script - I can't pay that  - you better get me some BP med and my shaking from this withdrawal is getting ready to start  - I drink Vodka - a quart a day for the last five days  - the last time I drank was 0500 this am."

## 2018-12-09 NOTE — ED Provider Notes (Signed)
[facility] Emergency Department Provider Note ____________________________________________   First MD Initiated Contact with Patient 12/09/18 [phone]     (approximate)  I have reviewed the triage vital signs and the nursing notes.   HISTORY  Chief Complaint Alcohol Problem    HPI [name] White is a 48 y.o. male with PMH as noted below who presents with alcohol abuse, describing heavy drinking over approximately last 5 days up to a quart of liquor per day.  The patient states his last drink was about 4 hours ago.  He reports feeling anxious and shaky and states he feels like he is withdrawing.  The patient denies any drug use.  He denies any other physical symptoms.  He denies any depression.  He states that earlier today he briefly felt like driving his truck off a cliff, but states that this was momentary.  He denies any SI or HI, and states "I would never do that."   Past Medical History:  Diagnosis Date  . Alcohol abuse, in remission   . Anxiety   . Arthritis   . Avascular necrosis (HCC)   . Depression   . GERD (gastroesophageal reflux disease)   . History of alcohol abuse   . Hyperlipidemia   . Hypertension   . IFG (impaired fasting glucose)   . Overweight     Patient Active Problem List   Diagnosis Date Noted  . AKI (acute kidney injury) (HCC) 03/22/2018  . Medication monitoring encounter 01/14/2017  . Abnormal nuclear stress test 12/31/2015  . Snoring 11/06/2015  . Elevated liver function tests 05/19/2015  . Fatty liver 05/19/2015  . Elevated serum GGT level 05/17/2015  . IFG (impaired fasting glucose)   . Hyperlipidemia   . Essential hypertension   . Depression   . Anxiety   . GERD (gastroesophageal reflux disease)   . Overweight   . Alcohol abuse, episodic   . 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 CARDIAC CATHETERIZATION N/A 01/02/2016   Procedure: Left  Heart Cath and Coronary Angiography;  Surgeon: Marykay Lex, MD;  Location: [facility] INVASIVE CV LAB;  Service: Cardiovascular;  Laterality: N/A;  . CARPAL TUNNEL RELEASE Bilateral    right x 2, left x 1  . DIALYSIS/PERMA CATHETER INSERTION N/A 03/23/2018   Procedure: DIALYSIS/PERMA CATHETER INSERTION;  Surgeon: Renford Dills, MD;  Location: [facility] INVASIVE CV LAB;  Service: Cardiovascular;  Laterality: N/A;  . TESTICLE TORSION REDUCTION    . TOTAL HIP ARTHROPLASTY Left 01/30/2014   Procedure: LEFT TOTAL HIP ARTHROPLASTY ANTERIOR APPROACH;  Surgeon: Shelda Pal, MD;  Location: [facility] ORS;  Service: Orthopedics;  Laterality: Left;  Marland Kitchen VASECTOMY  1995  . VASECTOMY REVERSAL  1997    Prior to Admission medications   Medication Sig Start Date End Date Taking? Authorizing Provider  amLODipine (NORVASC) 10 MG tablet Take 10 mg by mouth daily.   Yes [provider]  aspirin EC 81 MG tablet Take 81 mg by mouth daily.   Yes [provider]  metoprolol succinate (TOPROL-XL) 100 MG 24 hr tablet Take 100 mg by mouth daily.   Yes [provider]  venlafaxine XR (EFFEXOR-XR) 37.5 MG 24 hr capsule Take 1 capsule (37.5 mg total) by mouth daily with breakfast. appt needed for further refills 11/04/17  Yes [name], Janit Bern, MD  atorvastatin (LIPITOR) 20 MG tablet Take 1 tablet (20 mg total) by mouth at bedtime.  Patient not taking: Reported on 12/09/2018 11/05/17   Kerman PasseyLada, [name], MD  esomeprazole (NEXIUM) 40 MG capsule Take 1 capsule (40 mg total) by mouth daily. Patient not taking: Reported on 12/09/2018 03/25/18   Enid BaasKalisetti, Radhika, MD    Allergies Ace inhibitors; Captopril; Enalapril; Fosinopril; Lisinopril; and Ramipril  Family History  Problem Relation Age of Onset  . Hypertension Mother   . Diabetes Mother   . Hypertension Father   . Stroke Father   . Cancer Neg Hx   . COPD Neg Hx   . Heart disease Neg Hx     Social History Social History   Tobacco Use  . Smoking status:  Current Every Day Smoker    Packs/day: 1.00    Years: 30.00    Pack years: 30.00    Types: Cigarettes  . Smokeless tobacco: Never Used  Substance Use Topics  . Alcohol use: No    Frequency: Never  . Drug use: No    Review of Systems  Constitutional: No fever. Eyes: No redness. ENT: No sore throat.  Cardiovascular: Denies chest pain. Respiratory: Denies shortness of breath. Gastrointestinal: No vomiting. Genitourinary: Negative for flank pain.  Musculoskeletal: Negative for back pain. Skin: Negative for rash. Neurological: Negative for headache.   ____________________________________________   PHYSICAL EXAM:  VITAL SIGNS: ED Triage Vitals  Enc Vitals Group     BP 12/09/18 0826 (!) 152/104     Pulse Rate 12/09/18 0825 (!) 121     Resp 12/09/18 0825 (!) 24     Temp 12/09/18 0825 97.9 F (36.6 C)     Temp Source 12/09/18 0825 Oral     SpO2 12/09/18 0825 95 %     Weight 12/09/18 0825 190 lb (86.2 kg)     Height 12/09/18 0825 5\' 9"  (1.753 m)     Head Circumference --      Peak Flow --      Pain Score 12/09/18 0825 5     Pain Loc --      Pain Edu? --      Excl. in GC? --     Constitutional: Alert and oriented.  Relatively well appearing and in no acute distress. Eyes: Conjunctivae are normal.  Pupils slightly dilated. Head: Atraumatic. Nose: No congestion/rhinnorhea. Mouth/Throat: Mucous membranes are slightly dry.   Neck: Normal range of motion.  Cardiovascular: Tachycardic, regular rhythm. Good peripheral circulation. Respiratory: Normal respiratory effort.  No retractions.  Gastrointestinal: No distention.  Musculoskeletal:  Extremities warm and well perfused.  Neurologic:  Normal speech and language. No gross focal neurologic deficits are appreciated.  Skin:  Skin is warm and dry. No rash noted. Psychiatric: Anxious appearing.  ____________________________________________   LABS (all labs ordered are listed, but only abnormal results are  displayed)  Labs Reviewed  COMPREHENSIVE METABOLIC PANEL - Abnormal; Notable for the following components:      Result Value   Potassium 3.1 (*)    Chloride 97 (*)    CO2 18 (*)    Glucose, Bld 139 (*)    Total Protein 8.2 (*)    AST 47 (*)    Anion gap 21 (*)    All other components within normal limits  ETHANOL - Abnormal; Notable for the following components:   Alcohol, Ethyl (B) 146 (*)    All other components within normal limits  ACETAMINOPHEN LEVEL - Abnormal; Notable for the following components:   Acetaminophen (Tylenol), Serum <10 (*)    All other components within normal limits  SALICYLATE LEVEL  CBC  URINE DRUG SCREEN, QUALITATIVE ([facility] ONLY)   ____________________________________________  EKG   ____________________________________________  RADIOLOGY    ____________________________________________   PROCEDURES  Procedure(s) performed: No  Procedures  Critical Care performed: No ____________________________________________   INITIAL IMPRESSION / ASSESSMENT AND PLAN / ED COURSE  Pertinent labs & imaging results that were available during my care of the patient were reviewed by me and considered in my medical decision making (see chart for details).  49 year old male with a history of alcohol abuse and other PMH as noted above presents with alcohol abuse over the last 5 days with his last drink 4 hours ago.  The patient states he is in the beginnings of withdrawal.  He denies any other acute medical symptoms.  He admits that earlier today he briefly felt like driving his truck off a cliff, however he denies any active SI or HI and he is able to contract for safety.  I reviewed the past medical records in epic; the most recent admission was last year with dehydration, AKI, and rhabdomyolysis after working outside.  On exam the patient is tachycardic and slightly hypertensive.  He has mild tremors and appears anxious.  The remainder the exam is  unremarkable.  Presentation is consistent with alcohol withdrawal.  We will give Ativan and fluids.  I have ordered TTS evaluation for detox.  We will obtain labs for medical clearance, and admit medically if there are concerning findings on the work-up or if the patient requires multiple doses of medication to control his withdrawal symptoms and normalized vital signs.  At this time, the patient has no active SI and is able to contract for safety.  He has no history of self-harm or suicidality.  I believe that the thoughts he described earlier were momentary and fleeting, and there is no evidence of acute danger to self or others.  There is no indication for IVC.  ----------------------------------------- 12:11 PM on 12/09/2018 -----------------------------------------  Vital signs have significantly improved after the medication and the patient is no longer tremulous.  His lab work-up is unremarkable except for borderline low potassium.  He is medically cleared at this time.  TTS is working on detox placement. ____________________________________________   FINAL CLINICAL IMPRESSION(S) / ED DIAGNOSES  Final diagnoses:  Alcohol abuse      NEW MEDICATIONS STARTED DURING THIS VISIT:  New Prescriptions   No medications on file     Note:  This document was prepared using Dragon voice recognition software and may include unintentional dictation errors.    Dionne Bucy, MD 12/09/18 [phone]

## 2018-12-09 NOTE — BH Assessment (Addendum)
  This information was relayed to pt's nurse (Amy T).

## 2018-12-09 NOTE — ED Notes (Signed)

## 2018-12-09 NOTE — ED Notes (Signed)
BEHAVIORAL HEALTH ROUNDING Patient sleeping: No. Patient alert and oriented: yes Behavior appropriate: Yes.  ; If no, describe:  Nutrition and fluids offered: yes Toileting and hygiene offered: Yes  Sitter present: q15 minute observations and security  monitoring Law enforcement present: Yes  ODS  

## 2018-12-09 NOTE — ED Notes (Signed)
Pt visit complete, pt walking back out to lobby with pt relations [name]

## 2018-12-09 NOTE — ED Notes (Signed)
Pt. Resting comfortable in bed watching tv.  Pt. Requested and was given meal tray and something to drink.  Pt. Has no concerns or questions at this time.

## 2018-12-09 NOTE — ED Triage Notes (Signed)
Here for ETOH withdrawal.  Pt reports starting to withdrawal. Last drink 0500 AM.  Pt drinks about quart vodka per day.  Pt wants to get off alcohol.  Wants something for his BP.  Has been off bp meds for 2 weeks.  When asked pt about SI, pt states "I would rather not talk about that right now".  When daughter encouraged pt to talk, pt states "I thought about it before I called you".

## 2018-12-09 NOTE — ED Notes (Signed)
Patient requested for [place] to have his( AUNT ) PHONE NUMBER : Patty Wall(  [phone].

## 2018-12-09 NOTE — ED Notes (Signed)
Patient waiting for acceptance at a detox facility  1805

## 2018-12-09 NOTE — ED Notes (Signed)
Pt has a visitor at this time and escorted by Pt relations [name], pt's wife at pt bedside at this time, pt visit being monitored by this tech

## 2018-12-09 NOTE — ED Notes (Signed)
Dressed out by this Licensed conveyancer. Daughter taking all belongings.

## 2018-12-09 NOTE — BH Assessment (Signed)
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 And he also reported that he does not currently have health insurance. This information was reported to [place] ([facility] Intake RN) - pt was still denied for detox treatment.  Pt information has been faxed out for detox placement.

## 2018-12-09 NOTE — ED Notes (Signed)
Pt provided food, and provided a sprite. Pt with no other concerns at this time

## 2018-12-09 NOTE — ED Notes (Signed)
Pt resting quietly in his room at this time, pt bed lowered to lowest position, expresses no concerns, offered warm blanket,pt reminded where the restroom was if the need arises,pt offered Beverages, pt has no needs at this time, will continue to do q 15 min checks

## 2018-12-09 NOTE — ED Notes (Signed)
Called TTS to get an update about pts placement into ETOH detox

## 2018-12-09 NOTE — BH Assessment (Signed)
Pt spoke with pt regarding his alcohol use. Pt reports he has been binge drinking for the last 4-5 days. He called his daughter to pick him up and take him to the hospital.  Pt states he is interested in detox treatment. Pt gave this Clinical research associate verbal consent to fax his information to [facility] for possible detox placement.  This information was relayed to pt's nurse and EDP.

## 2018-12-09 NOTE — ED Notes (Signed)
Received a call from [facility] stating that [facility] has accepted the pt and they will not be able to pick him up until the am  - I have informed the pt of this plan of care    Plan to speak with MD about orders for [facility]

## 2018-12-10 NOTE — ED Provider Notes (Signed)
-----------------------------------------   6:58 AM on 12/10/2018 -----------------------------------------   Blood pressure (!) 170/101, pulse 89, temperature 98.4 F (36.9 C), temperature source Oral, resp. rate 20, height 5\' 9"  (1.753 m), weight 86.2 kg, SpO2 99 %.  The patient is calm and cooperative at this time.  There have been no acute events since the last update.  Awaiting disposition plan from Behavioral Medicine team.   Irean Hong, MD 12/10/18 [phone]

## 2018-12-10 NOTE — ED Notes (Addendum)
Pt given breakfast tray and drink. Pt offered hand sanitizer. Pt requested to call his wife but informed phone hours started at 9am.

## 2018-12-10 NOTE — ED Notes (Signed)
Pt given phone to call wife.

## 2018-12-10 NOTE — ED Notes (Signed)
Pt denies SI/HI/AVH. Pt given discharge instructions including f/u appointments. Pt states understanding. Pt states receipt of all belongings.   

## 2018-12-10 NOTE — ED Provider Notes (Signed)
-----------------------------------------   9:20 AM on 12/10/2018 -----------------------------------------  Per [facility] nurse, the patient would like to leave.  TTS has not yet been able to secure placement at a detox for him, and the patient would like to be discharged.  He is hypertensive which is chronic for him.  He is no longer tachycardic and has no signs or symptoms of active alcohol withdrawal.  He is not under IVC and there is no evidence of danger to self or others.  He is stable for discharge home at this time.  Return precautions have been provided, as well as a resource guide with options for outpatient alcohol abuse counseling and treatment.   Dionne Bucy, MD 12/10/18 [phone]

## 2018-12-10 NOTE — ED Notes (Signed)
E-signature pad not available.  Printed paperwork, patient signed, placed in chart.

## 2018-12-10 NOTE — Discharge Instructions (Addendum)
Follow-up with an outpatient treatment center as per the resources that have been provided.  Continue taking your regular medications as prescribed.  Return to the ER for new or worsening shakes, anxiety, vomiting, withdrawal symptoms, or any other new or worsening symptoms that concern you.

## 2018-12-10 NOTE — ED Notes (Signed)
Patient is alert and oriented. Patient is calm and cooperative. Patient states denies any symptoms of alcohol withdrawal at this time. Patient provided support and encouragement. Q 15 minute checks in progress and patient remains safe on unit. Monitoring continues.

## 2019-01-20 ENCOUNTER — Telehealth: Payer: Self-pay | Admitting: Family Medicine

## 2019-01-20 ENCOUNTER — Other Ambulatory Visit: Payer: Self-pay

## 2019-01-20 ENCOUNTER — Ambulatory Visit (INDEPENDENT_AMBULATORY_CARE_PROVIDER_SITE_OTHER): Payer: Self-pay | Admitting: Family Medicine

## 2019-01-20 ENCOUNTER — Encounter: Payer: Self-pay | Admitting: Family Medicine

## 2019-01-20 DIAGNOSIS — E782 Mixed hyperlipidemia: Secondary | ICD-10-CM

## 2019-01-20 DIAGNOSIS — F1010 Alcohol abuse, uncomplicated: Secondary | ICD-10-CM

## 2019-01-20 DIAGNOSIS — K760 Fatty (change of) liver, not elsewhere classified: Secondary | ICD-10-CM

## 2019-01-20 DIAGNOSIS — R945 Abnormal results of liver function studies: Secondary | ICD-10-CM

## 2019-01-20 DIAGNOSIS — Z5181 Encounter for therapeutic drug level monitoring: Secondary | ICD-10-CM

## 2019-01-20 DIAGNOSIS — F419 Anxiety disorder, unspecified: Secondary | ICD-10-CM

## 2019-01-20 DIAGNOSIS — I10 Essential (primary) hypertension: Secondary | ICD-10-CM

## 2019-01-20 DIAGNOSIS — R7989 Other specified abnormal findings of blood chemistry: Secondary | ICD-10-CM

## 2019-01-20 DIAGNOSIS — R7301 Impaired fasting glucose: Secondary | ICD-10-CM

## 2019-01-20 MED ORDER — VENLAFAXINE HCL ER 75 MG PO CP24: 75.0000 mg | ORAL_CAPSULE | Freq: Every day | ORAL | 0 refills | Status: DC

## 2019-01-20 MED ORDER — METOPROLOL SUCCINATE ER 100 MG PO TB24: 100.0000 mg | ORAL_TABLET | Freq: Every day | ORAL | 1 refills | Status: DC

## 2019-01-20 MED ORDER — AMLODIPINE BESYLATE 10 MG PO TABS: 10.0000 mg | ORAL_TABLET | Freq: Every day | ORAL | 1 refills | Status: DC

## 2019-01-20 MED ORDER — ESOMEPRAZOLE MAGNESIUM 40 MG PO CPDR: 40.0000 mg | DELAYED_RELEASE_CAPSULE | Freq: Every day | ORAL | 0 refills | Status: DC

## 2019-01-20 MED ORDER — HYDRALAZINE HCL 10 MG PO TABS: 10.0000 mg | ORAL_TABLET | Freq: Three times a day (TID) | ORAL | 1 refills | Status: DC

## 2019-01-20 NOTE — Assessment & Plan Note (Signed)
ER visit for same in March; patient reports no alcohol since that visit; he was given information at the ER on facilities, help if desired

## 2019-01-20 NOTE — Progress Notes (Signed)
There were no vitals taken for this visit.   Subjective:    Patient ID: [name] White, male    DOB: 28-Jan-1971, 48 y.o.   MRN: [id number]  HPI: [name] White is a 48 y.o. male  Chief Complaint  Patient presents with  . Medication Refill    HPI Virtual Visit via Telephone/Video Note   I connected with the patient via:  video I verified that I am speaking with the correct person using two identifiers.  Call started: 2;13 pm Call terminated: 2:28 pm Total length of call: 15 minutes   I discussed the limitations, risks, and privacy concerns of performing an evaluation and management service by telephone and the availability of in-person appointments. I explained that he/she may be responsible for charges related to this service. The patient expressed understanding and agreed to proceed.  Provider location: home, upstairs office with door closed, earphones/headset on Patient location: at work, at the warehouse  Additional participants: alone  Patient was last seen by me on November 04, 2017 and before that, January 14, 2017 He has had three emergency department visits / hospital admissions since our last visit Most recently, he was in the ER for alcohol abuse; note reviewed He has been doing well overall No alcohol since that episode; he says he just had a bad four days but has not drunk since then  Last time BP was checked was four days ago, 150/81 he thinks; "that's good for me" Taking medicines regularly He has cut down on salt a lot  Not taking atorvastatin for over two years No symptoms  Prediabetes noted on last summer's labs Lab Results  Component Value Date   HGBA1C 5.8 (H) 03/23/2018  Biological mother had diabetes, not sure how severe or how early she got it Low potassium so taking OTC potassium  He was hospitalized in June for dehydration; 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 0 Yes No No  Number falls in past yr: - 1 - -  Injury with Fall? - No - -    Relevant past medical, surgical, family and social history reviewed Past Medical History:  Diagnosis Date  . Alcohol abuse, in remission   . Anxiety   . Arthritis   . Avascular necrosis (HCC)   . Depression   . GERD (gastroesophageal reflux disease)   . History of alcohol abuse   . Hyperlipidemia   . Hypertension   . IFG (impaired fasting glucose)   . Overweight    Past Surgical History:  Procedure Laterality Date  . CARDIAC CATHETERIZATION N/A 01/02/2016   Procedure: Left Heart Cath and Coronary Angiography;  Surgeon: Marykay Lex, MD;  Location: [facility] INVASIVE CV LAB;  Service: Cardiovascular;  Laterality: N/A;  . CARPAL TUNNEL RELEASE Bilateral    right x 2, left x 1  . DIALYSIS/PERMA CATHETER INSERTION N/A 03/23/2018  Procedure: DIALYSIS/PERMA CATHETER INSERTION;  Surgeon: Renford Dills, MD;  Location: [facility] INVASIVE CV LAB;  Service: Cardiovascular;  Laterality: N/A;  . TESTICLE TORSION REDUCTION    . TOTAL HIP ARTHROPLASTY Left 01/30/2014   Procedure: LEFT TOTAL HIP ARTHROPLASTY ANTERIOR APPROACH;  Surgeon: Shelda Pal, MD;  Location: [facility] ORS;  Service: Orthopedics;  Laterality: Left;  Marland Kitchen VASECTOMY  1995  . VASECTOMY REVERSAL  1997   Family History  Problem Relation Age of Onset  . Hypertension Mother   . Diabetes Mother   . Hypertension Father   . Stroke Father   .  Cancer Neg Hx   . COPD Neg Hx   . Heart disease Neg Hx    Social History   Tobacco Use  . Smoking status: Current Every Day Smoker    Packs/day: 1.00    Years: 30.00    Pack years: 30.00    Types: Cigarettes  . Smokeless tobacco: Never Used  Substance Use Topics  . Alcohol use: No    Frequency: Never  . Drug use: No     Office Visit from 01/20/2019 in [facility]  AUDIT-C Score  0      Interim medical history since last visit reviewed. Allergies and medications reviewed  Review of Systems Per HPI unless specifically indicated above     Objective:    There were no vitals taken for this visit.  Wt Readings from Last 3 Encounters:  12/09/18 190 lb (86.2 kg)  03/24/18 186 lb 11.7 oz (84.7 kg)  11/04/17 190 lb 14.4 oz (86.6 kg)    Physical Exam Constitutional:      General: He is not in acute distress. Eyes:     General: No scleral icterus. Pulmonary:     Effort: No tachypnea or respiratory distress.  Skin:    Coloration: Skin is not jaundiced or pale.  Neurological:     Mental Status: He is alert.  Psychiatric:        Mood and Affect: Mood is not anxious or depressed.        Speech: Speech is not rapid and pressured, delayed or slurred.        Behavior: Behavior normal.        Cognition and Memory: Cognition normal.     Results for orders placed or performed during the hospital encounter of 12/09/18  Comprehensive metabolic panel  Result Value Ref Range   Sodium 136 135 - 145 mmol/L   Potassium 3.1 (L) 3.5 - 5.1 mmol/L   Chloride 97 (L) 98 - 111 mmol/L   CO2 18 (L) 22 - 32 mmol/L   Glucose, Bld 139 (H) 70 - 99 mg/dL   BUN 17 6 - 20 mg/dL   Creatinine, Ser 1.61 0.61 - 1.24 mg/dL   Calcium 9.0 8.9 - 09.6 mg/dL   Total Protein 8.2 (H) 6.5 - 8.1 g/dL   Albumin 4.8 3.5 - 5.0 g/dL   AST 47 (H) 15 - 41 U/L   ALT 35 0 - 44 U/L   Alkaline Phosphatase 88 38 - 126 U/L   Total Bilirubin 1.2 0.3 - 1.2 mg/dL   GFR calc non Af Amer >60 >60  mL/min   GFR calc Af Amer >60 >60 mL/min   Anion gap 21 (H) 5 - 15  Ethanol  Result Value Ref Range   Alcohol, Ethyl (B) 146 (H) <10 mg/dL  Salicylate level  Result Value Ref Range   Salicylate Lvl <7.0 2.8 - 30.0  mg/dL  Acetaminophen level  Result Value Ref Range   Acetaminophen (Tylenol), Serum <10 (L) 10 - 30 ug/mL  cbc  Result Value Ref Range   WBC 7.5 4.0 - 10.5 K/uL   RBC 5.67 4.22 - 5.81 MIL/uL   Hemoglobin 16.7 13.0 - 17.0 g/dL   HCT 16.1 09.6 - 04.5 %   MCV 82.5 80.0 - 100.0 fL   MCH 29.5 26.0 - 34.0 pg   MCHC 35.7 30.0 - 36.0 g/dL   RDW 40.9 81.1 - 91.4 %   Platelets 268 150 - 400 K/uL   nRBC 0.0 0.0 - 0.2 %  Urine Drug Screen, Qualitative  Result Value Ref Range   Tricyclic, Ur Screen NONE DETECTED NONE DETECTED   Amphetamines, Ur Screen NONE DETECTED NONE DETECTED   MDMA (Ecstasy)Ur Screen NONE DETECTED NONE DETECTED   Cocaine Metabolite,Ur [place] NONE DETECTED NONE DETECTED   Opiate, Ur Screen NONE DETECTED NONE DETECTED   Phencyclidine (PCP) Ur S NONE DETECTED NONE DETECTED   Cannabinoid 50 Ng, Ur [place] NONE DETECTED NONE DETECTED   Barbiturates, Ur Screen NONE DETECTED NONE DETECTED   Benzodiazepine, Ur Scrn NONE DETECTED NONE DETECTED   Methadone Scn, Ur NONE DETECTED NONE DETECTED      Assessment & Plan:   Problem List Items Addressed This Visit      Cardiovascular and Mediastinum   Essential hypertension - Primary (Chronic)    Chronic problem; patient on amlodipine and metoprolol; anaphylaxis with ACE-I; previous hypokalemia and renal failure with dehydration, so HCTZ is not my next choice; will start low dose hydralazine; considered clonidine but he is already on beta-blocker; he will start this, take every 8 hours, continue other two BP meds, and come in on Wednesday morning for BP and pulse check with CMA      Relevant Medications   amLODipine (NORVASC) 10 MG tablet   metoprolol succinate (TOPROL-XL) 100 MG 24 hr tablet   hydrALAZINE (APRESOLINE) 10 MG  tablet   Other Relevant Orders   TSH     Digestive   Fatty liver   Relevant Orders   COMPLETE METABOLIC PANEL WITH GFR     Endocrine   IFG (impaired fasting glucose) (Chronic)    Check A1c, patient will come in on Wednesday      Relevant Orders   Hemoglobin A1c     Other   Medication monitoring encounter   Relevant Orders   COMPLETE METABOLIC PANEL WITH GFR   Hyperlipidemia (Chronic)    Patient has not been taking statin, thinks that the previous lipid panel was not his, so has not taken chol med in two years; will check lipid panel next week when he comes in for labs      Relevant Medications   amLODipine (NORVASC) 10 MG tablet   metoprolol succinate (TOPROL-XL) 100 MG 24 hr tablet   hydrALAZINE (APRESOLINE) 10 MG tablet   Other Relevant Orders   Lipid panel   Elevated liver function tests    Check liver enzymes next week      Anxiety    Thanked patient for honesty about taking his wife's medicine, but I explianed that he must not do that; I'll be glad to prescribe him something, but don't take his wife's medicine; when I reviewed the the milligrams of hers and his were not comparable, he wishes to just increase his effexor; I asked him to call me in two weeks with an update; he specifically denies SI/HI  Relevant Medications   venlafaxine XR (EFFEXOR XR) 75 MG 24 hr capsule   Alcohol abuse, episodic    ER visit for same in March; patient reports no alcohol since that visit; he was given information at the ER on facilities, help if desired          Follow up plan: Return in about 5 days (around 01/25/2019) for blood pressure recheck with pulse and weight with CMA.  Meds ordered this encounter  Medications  . amLODipine (NORVASC) 10 MG tablet    Sig: Take 1 tablet (10 mg total) by mouth daily.    Dispense:  90 tablet    Refill:  1  . metoprolol succinate (TOPROL-XL) 100 MG 24 hr tablet    Sig: Take 1 tablet (100 mg total) by mouth daily.    Dispense:  90  tablet    Refill:  1  . esomeprazole (NEXIUM) 40 MG capsule    Sig: Take 1 capsule (40 mg total) by mouth daily.    Dispense:  90 capsule    Refill:  0  . hydrALAZINE (APRESOLINE) 10 MG tablet    Sig: Take 1 tablet (10 mg total) by mouth every 8 (eight) hours.    Dispense:  270 tablet    Refill:  1  . venlafaxine XR (EFFEXOR XR) 75 MG 24 hr capsule    Sig: Take 1 capsule (75 mg total) by mouth daily with breakfast.    Dispense:  90 capsule    Refill:  0    Orders Placed This Encounter  Procedures  . COMPLETE METABOLIC PANEL WITH GFR  . TSH  . Lipid panel  . Hemoglobin A1c

## 2019-01-20 NOTE — Assessment & Plan Note (Signed)
Check liver enzymes next week

## 2019-01-20 NOTE — Telephone Encounter (Signed)
Please schedule patient for an appointment With whom: CMA When: Wednesday morning, January 25, 2019 How: In person Why: blood pressure check, pulse and weight, and labs Thank you  Please schedule patient for an appointment With whom: Dr. Sherie Don When: 3 months How: In person Why: follow-up visit, HTN, prediabetes, anxiety Thank you

## 2019-01-20 NOTE — Assessment & Plan Note (Signed)
Check A1c, patient will come in on Wednesday

## 2019-01-20 NOTE — Assessment & Plan Note (Signed)
Patient has not been taking statin, thinks that the previous lipid panel was not his, so has not taken chol med in two years; will check lipid panel next week when he comes in for labs

## 2019-01-20 NOTE — Assessment & Plan Note (Signed)
Chronic problem; patient on amlodipine and metoprolol; anaphylaxis with ACE-I; previous hypokalemia and renal failure with dehydration, so HCTZ is not my next choice; will start low dose hydralazine; considered clonidine but he is already on beta-blocker; he will start this, take every 8 hours, continue other two BP meds, and come in on Wednesday morning for BP and pulse check with CMA

## 2019-01-20 NOTE — Assessment & Plan Note (Signed)
Thanked patient for honesty about taking his wife's medicine, but I explianed that he must not do that; I'll be glad to prescribe him something, but don't take his wife's medicine; when I reviewed the the milligrams of hers and his were not comparable, he wishes to just increase his effexor; I asked him to call me in two weeks with an update; he specifically denies SI/HI

## 2019-01-24 NOTE — Telephone Encounter (Signed)
This was done Signing off 

## 2019-01-25 ENCOUNTER — Ambulatory Visit: Payer: Self-pay

## 2019-02-10 ENCOUNTER — Telehealth: Payer: Self-pay | Admitting: Family Medicine

## 2019-02-10 NOTE — Telephone Encounter (Signed)
Copied from CRM [id number]. Topic: Quick Communication - Rx Refill/Question >> Feb 10, 2019  2:52 PM Lyn Hollingshead, [facility] wrote: Medication: venlafaxine XR (EFFEXOR XR) 75 MG 24 hr capsule  Pt called and states that pharmacy told him they do not have a script for this medication.

## 2019-02-12 NOTE — Telephone Encounter (Signed)
venlafaxine XR (EFFEXOR XR) 75 MG 24 hr capsule 90 capsule 0 01/20/2019    Sig - Route: Take 1 capsule (75 mg total) by mouth daily with breakfast. - Oral   Sent to pharmacy as: venlafaxine XR (EFFEXOR XR) 75 MG 24 hr capsule   E-Prescribing Status: Receipt confirmed by pharmacy (01/20/2019 2:27 PM EDT)    This was sent to [facility] on [address]. Did he want this somewhere else?

## 2019-02-13 NOTE — Telephone Encounter (Signed)
Left detailed VM asking patient to return my call and confirm pharmacy and let us know if he was able to pick up his medication.

## 2019-03-22 ENCOUNTER — Ambulatory Visit (INDEPENDENT_AMBULATORY_CARE_PROVIDER_SITE_OTHER): Payer: Self-pay | Admitting: Family Medicine

## 2019-03-22 ENCOUNTER — Other Ambulatory Visit: Payer: Self-pay

## 2019-03-22 ENCOUNTER — Encounter: Payer: Self-pay | Admitting: Family Medicine

## 2019-03-22 DIAGNOSIS — R454 Irritability and anger: Secondary | ICD-10-CM

## 2019-03-22 DIAGNOSIS — F419 Anxiety disorder, unspecified: Secondary | ICD-10-CM

## 2019-03-22 MED ORDER — BUSPIRONE HCL 7.5 MG PO TABS: 7.5000 mg | ORAL_TABLET | Freq: Every day | ORAL | 1 refills | Status: DC

## 2019-03-22 MED ORDER — ESCITALOPRAM OXALATE 10 MG PO TABS: ORAL_TABLET | ORAL | 1 refills | Status: DC

## 2019-03-22 NOTE — Progress Notes (Signed)
Name: [name] EvenerHerbert L Hardie   MRN: [id number]    DOB: 08/12/1971   Date:03/22/2019       Progress Note  Subjective  Chief Complaint  Chief Complaint  Patient presents with  . Depression    current med not working, would like to try diffrent one    I connected with  Jewett L Basquez  on 03/22/19 at 11:20 AM EDT by a video enabled telemedicine application and verified that I am speaking with the correct person using two identifiers.  I discussed the limitations of evaluation and management by telemedicine and the availability of in person appointments. The patient expressed understanding and agreed to proceed. Staff also discussed with the patient that there may be a patient responsible charge related to this service. Patient Location: Home Provider Location: Home Additional Individuals present: None  HPI  Pt presents with concern for depressive symptoms and medication concerns.  He used to take zoloft, but was having difficulty with anger issues, so he was switched to venlafaxine.  At last visit in April with Dr. Sherie DonLada, he was increased from 37.5mg  to 75mg .  He notes mood lability has worsened lately.  He would like to try to go back on Zoloft. Denies SI/HI. Discussed options in detail with the patient.  He has never physically harmed anyone during his outbursts, but does become verbally abusive at times.   Patient Active Problem List   Diagnosis Date Noted  . Medication monitoring encounter 01/14/2017  . Abnormal nuclear stress test 12/31/2015  . Snoring 11/06/2015  . Elevated liver function tests 05/19/2015  . Fatty liver 05/19/2015  . Elevated serum GGT level 05/17/2015  . IFG (impaired fasting glucose)   . Hyperlipidemia   . Essential hypertension   . Depression   . Anxiety   . GERD (gastroesophageal reflux disease)   . Overweight   . Alcohol abuse, episodic   . 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  CARDIAC CATHETERIZATION N/A 01/02/2016   Procedure: Left Heart Cath and Coronary Angiography;  Surgeon: Marykay Lexavid W Harding, MD;  [facility];  Service: Cardiovascular;  Laterality: N/A;  . CARPAL TUNNEL RELEASE Bilateral    right x 2, left x 1  . DIALYSIS/PERMA CATHETER INSERTION N/A 03/23/2018   Procedure: DIALYSIS/PERMA CATHETER INSERTION;  Surgeon: Renford DillsSchnier, Gregory G, MD;  Location: [facility] INVASIVE CV LAB;  Service: Cardiovascular;  Laterality: N/A;  . TESTICLE TORSION REDUCTION    . TOTAL HIP ARTHROPLASTY Left 01/30/2014   Procedure: LEFT TOTAL HIP ARTHROPLASTY ANTERIOR APPROACH;  Surgeon: Shelda PalMatthew D Olin, MD;  Location: [facility] ORS;  Service: Orthopedics;  Laterality: Left;  Marland Kitchen. VASECTOMY  1995  . VASECTOMY REVERSAL  1997    Family History  Problem Relation Age of Onset  . Hypertension Mother   . Diabetes Mother   . Hypertension Father   . Stroke Father   . Cancer Neg Hx   . COPD Neg Hx   . Heart disease Neg Hx     Social History   Socioeconomic History  . Marital status: Married    Spouse name: Not on file  . Number of children: 5  . Years of education: 4212  . Highest education level: Not on file  Occupational History  . Occupation: lawn care    Comment: [employer]  Social Needs  . Financial resource strain: Not on file  . Food insecurity  Worry: Not on file    Inability: Not on file  . Transportation needs    Medical: Not on file    Non-medical: Not on file  Tobacco Use  . Smoking status: Current Every Day Smoker    Packs/day: 1.00    Years: 30.00    Pack years: 30.00    Types: Cigarettes  . Smokeless tobacco: Never Used  Substance and Sexual Activity  . Alcohol use: No    Frequency: Never  . Drug use: No  . Sexual activity: Yes  Lifestyle  . Physical activity    Days per week: Not on file    Minutes per session: Not on file  . Stress: Not on file  Relationships  . Social Musicianconnections    Talks on phone: Not on file    Gets together:  Not on file    Attends religious service: Not on file    Active member of club or organization: Not on file    Attends meetings of clubs or organizations: Not on file    Relationship status: Not on file  . Intimate partner violence    Fear of current or ex partner: Not on file    Emotionally abused: Not on file    Physically abused: Not on file    Forced sexual activity: Not on file  Other Topics Concern  . Not on file  Social History Narrative   epworth sleepiness scale = 6 (12/31/15)     Current Outpatient Medications:  .  amLODipine (NORVASC) 10 MG tablet, Take 1 tablet (10 mg total) by mouth daily., Disp: 90 tablet, Rfl: 1 .  aspirin EC 81 MG tablet, Take 81 mg by mouth daily., Disp: , Rfl:  .  esomeprazole (NEXIUM) 40 MG capsule, Take 1 capsule (40 mg total) by mouth daily., Disp: 90 capsule, Rfl: 0 .  hydrALAZINE (APRESOLINE) 10 MG tablet, Take 1 tablet (10 mg total) by mouth every 8 (eight) hours., Disp: 270 tablet, Rfl: 1 .  metoprolol succinate (TOPROL-XL) 100 MG 24 hr tablet, Take 1 tablet (100 mg total) by mouth daily., Disp: 90 tablet, Rfl: 1 .  venlafaxine XR (EFFEXOR XR) 75 MG 24 hr capsule, Take 1 capsule (75 mg total) by mouth daily with breakfast., Disp: 90 capsule, Rfl: 0  Allergies  Allergen Reactions  . Ace Inhibitors Anaphylaxis  . Captopril Anaphylaxis  . Enalapril Anaphylaxis  . Fosinopril Anaphylaxis  . Lisinopril Anaphylaxis  . Ramipril Anaphylaxis    I personally reviewed active problem list, medication list, allergies with the patient/caregiver today.   ROS Ten systems reviewed and is negative except as mentioned in HPI  Objective  Virtual encounter, vitals not obtained.  There is no height or weight on file to calculate BMI.  Physical Exam Constitutional: Patient appears well-developed and well-nourished. No distress.  HENT: Head: Normocephalic and atraumatic.  Neck: Normal range of motion. Pulmonary/Chest: Effort normal. No respiratory  distress. Speaking in complete sentences Neurological: Pt is alert and oriented to person, place, and time. Coordination, speech are normal.  Psychiatric: Patient has a normal mood and affect. behavior is normal. Judgment and thought content normal.  No results found for this or any previous visit (from the past 72 hour(s)).  PHQ2/9: Depression screen [facility] 2/9 03/22/2019 11/04/2017 01/14/2017 11/06/2015 05/17/2015  Decreased Interest - 0 0 0 1  Down, Depressed, Hopeless 1 0 1 1 0  PHQ - 2 Score 1 0 1 1 1   Altered sleeping 1 - - - -  Tired, decreased energy 0 - - - -  Change in appetite 0 - - - -  Feeling bad or failure about yourself  0 - - - -  Trouble concentrating 0 - - - -  Moving slowly or fidgety/restless 0 - - - -  Suicidal thoughts 0 - - - -  PHQ-9 Score 2 - - - -  Difficult doing work/chores Somewhat difficult - - - -   PHQ-2/9 Result is negative.    Fall Risk: Fall Risk  03/22/2019 01/20/2019 11/04/2017 01/14/2017 05/17/2015  Falls in the past year? 0 0 Yes No No  Number falls in past yr: 0 - 1 - -  Injury with Fall? 0 - No - -     Assessment & Plan  1. Anxiety - Taper down discussed in detail, will work on obtaining counseling over the next 4 weeks to work on developing tools to cope with anxiety and anger.  Add buspar to help with anxiety and difficulty falling asleep.  May increase to BID dosing if needed. - escitalopram (LEXAPRO) 10 MG tablet; 1 tab Effexor + 1/2 tab Lexapro once daily x7 days, then 1/2tab effexor + 1tab Lexapro once daily x7days, then STOP effexor, take 1 tablet Lexapro once daily  Dispense: 30 tablet; Refill: 1 - busPIRone (BUSPAR) 7.5 MG tablet; Take 1 tablet (7.5 mg total) by mouth at bedtime.  Dispense: 30 tablet; Refill: 1  2. Outbursts of anger - escitalopram (LEXAPRO) 10 MG tablet; 1 tab Effexor + 1/2 tab Lexapro once daily x7 days, then 1/2tab effexor + 1tab Lexapro once daily x7days, then STOP effexor, take 1 tablet Lexapro once daily  Dispense: 30  tablet; Refill: 1 - busPIRone (BUSPAR) 7.5 MG tablet; Take 1 tablet (7.5 mg total) by mouth at bedtime.  Dispense: 30 tablet; Refill: 1  I discussed the assessment and treatment plan with the patient. The patient was provided an opportunity to ask questions and all were answered. The patient agreed with the plan and demonstrated an understanding of the instructions.  The patient was advised to call back or seek an in-person evaluation if the symptoms worsen or if the condition fails to improve as anticipated.  I provided 16 minutes of non-face-to-face time during this encounter.

## 2019-03-22 NOTE — Patient Instructions (Addendum)
[email or web address] -therapist  Taper Instructions:  Take 1 tablet Venlafaxine + 1/2 tablet Escitalopram (Lexapro) once daily for 7 days Then take 1/2 tablet Venlafaxine + 1 tablet Escitalopram (Lexapro) once daily for 7 days Then STOP Venlafaxine + Take 1 tablet Escitalopram (Lexapro) once daily

## 2019-04-06 ENCOUNTER — Other Ambulatory Visit: Payer: Self-pay

## 2019-04-06 ENCOUNTER — Encounter (HOSPITAL_COMMUNITY): Payer: Self-pay | Admitting: Emergency Medicine

## 2019-04-06 ENCOUNTER — Emergency Department (HOSPITAL_COMMUNITY)
Admission: EM | Admit: 2019-04-06 | Discharge: 2019-04-07 | Disposition: A | Discharge status: Other Acute Inpatient Hospital | Payer: Self-pay | Attending: Emergency Medicine | Admitting: Emergency Medicine

## 2019-04-06 DIAGNOSIS — Z79899 Other long term (current) drug therapy: Secondary | ICD-10-CM | POA: Insufficient documentation

## 2019-04-06 DIAGNOSIS — Z139 Encounter for screening, unspecified: Secondary | ICD-10-CM | POA: Insufficient documentation

## 2019-04-06 DIAGNOSIS — Z7982 Long term (current) use of aspirin: Secondary | ICD-10-CM | POA: Insufficient documentation

## 2019-04-06 DIAGNOSIS — I10 Essential (primary) hypertension: Secondary | ICD-10-CM | POA: Insufficient documentation

## 2019-04-06 DIAGNOSIS — Z20828 Contact with and (suspected) exposure to other viral communicable diseases: Secondary | ICD-10-CM | POA: Insufficient documentation

## 2019-04-06 DIAGNOSIS — F332 Major depressive disorder, recurrent severe without psychotic features: Secondary | ICD-10-CM | POA: Insufficient documentation

## 2019-04-06 DIAGNOSIS — Y908 Blood alcohol level of 240 mg/100 ml or more: Secondary | ICD-10-CM | POA: Insufficient documentation

## 2019-04-06 DIAGNOSIS — F17210 Nicotine dependence, cigarettes, uncomplicated: Secondary | ICD-10-CM | POA: Insufficient documentation

## 2019-04-06 DIAGNOSIS — F1010 Alcohol abuse, uncomplicated: Secondary | ICD-10-CM | POA: Insufficient documentation

## 2019-04-06 DIAGNOSIS — R45851 Suicidal ideations: Secondary | ICD-10-CM | POA: Insufficient documentation

## 2019-04-06 LAB — CBC
HCT: 50.3 % (ref 39.0–52.0)
Hemoglobin: 17.3 g/dL — ABNORMAL HIGH (ref 13.0–17.0)
MCH: 30.1 pg (ref 26.0–34.0)
MCHC: 34.4 g/dL (ref 30.0–36.0)
MCV: 87.5 fL (ref 80.0–100.0)
Platelets: 349 10*3/uL (ref 150–400)
RBC: 5.75 MIL/uL (ref 4.22–5.81)
RDW: 14.4 % (ref 11.5–15.5)
WBC: 9.2 10*3/uL (ref 4.0–10.5)
nRBC: 0 % (ref 0.0–0.2)

## 2019-04-06 LAB — COMPREHENSIVE METABOLIC PANEL
ALT: 32 U/L (ref 0–44)
AST: 29 U/L (ref 15–41)
Albumin: 4.8 g/dL (ref 3.5–5.0)
Alkaline Phosphatase: 96 U/L (ref 38–126)
Anion gap: 15 (ref 5–15)
BUN: 15 mg/dL (ref 6–20)
CO2: 22 mmol/L (ref 22–32)
Calcium: 9 mg/dL (ref 8.9–10.3)
Chloride: 105 mmol/L (ref 98–111)
Creatinine, Ser: 0.79 mg/dL (ref 0.61–1.24)
GFR calc Af Amer: >60 mL/min (ref >60)
GFR calc non Af Amer: >60 mL/min (ref >60)
Glucose, Bld: 117 mg/dL — ABNORMAL HIGH (ref 70–99)
Potassium: 3.5 mmol/L (ref 3.5–5.1)
Sodium: 142 mmol/L (ref 135–145)
Total Bilirubin: 0.4 mg/dL (ref 0.3–1.2)
Total Protein: 8.5 g/dL — ABNORMAL HIGH (ref 6.5–8.1)

## 2019-04-06 LAB — SALICYLATE LEVEL: Salicylate Lvl: <7 mg/dL (ref 2.8–30.0)

## 2019-04-06 LAB — ETHANOL: Alcohol, Ethyl (B): 346 mg/dL (ref <10)

## 2019-04-06 LAB — ACETAMINOPHEN LEVEL: Acetaminophen (Tylenol), Serum: <10 ug/mL — ABNORMAL LOW (ref 10–30)

## 2019-04-06 MED ORDER — LORAZEPAM 2 MG/ML IJ SOLN: 0.0000 mg | Freq: Two times a day (BID) | INTRAMUSCULAR | Status: DC

## 2019-04-06 MED ORDER — LORAZEPAM 1 MG PO TABS: 0.0000 mg | ORAL_TABLET | Freq: Two times a day (BID) | ORAL | Status: DC

## 2019-04-06 MED ORDER — LORAZEPAM 1 MG PO TABS
0.0000 mg | ORAL_TABLET | Freq: Four times a day (QID) | ORAL | Status: DC
  Administered 2019-04-07: 13:00:00 2 mg via ORAL
  Filled 2019-04-06: qty 2

## 2019-04-06 MED ORDER — LORAZEPAM 2 MG/ML IJ SOLN: 0.0000 mg | Freq: Four times a day (QID) | INTRAMUSCULAR | Status: DC

## 2019-04-06 MED ORDER — THIAMINE HCL 100 MG/ML IJ SOLN: 100.0000 mg | Freq: Every day | INTRAMUSCULAR | Status: DC

## 2019-04-06 MED ORDER — VITAMIN B-1 100 MG PO TABS
100.0000 mg | ORAL_TABLET | Freq: Every day | ORAL | Status: DC
  Administered 2019-04-07: 100 mg via ORAL
  Filled 2019-04-06: qty 1

## 2019-04-06 NOTE — ED Triage Notes (Signed)
Pt presents under IVC due to excessive alcohol intake and stating to aunt through text that he wants to be with deceased significant other. Pt reports to drinking 1/5 of liquor daily.

## 2019-04-06 NOTE — ED Provider Notes (Signed)
[facility]-EMERGENCY DEPT Provider Note   CSN: [id number] Arrival date & time: 04/06/19  2214     History   Chief Complaint Chief Complaint  Patient presents with  . IVC    HPI [name] White is a 48 y.o. male.     Patient presents to the emergency department with a chief complaint of alcohol intoxication.  He is under IVC.  IVC papers taken out by family, who states that the patient is trying to "drink himself to death."  Patient has expressed a desire to "join his truelove," who is dead.  Family reports that he drinks approximately a half a gallon of liquor per day.  PD reports that they are very familiar with the patient and he is always intoxicated.  Denies any drug use.  Denies any suicidal or homicidal thoughts.  Denies any auditory or visual hallucinations.  Denies being in any pain.  The history is provided by the patient. No language interpreter was used.    Past Medical History:  Diagnosis Date  . Alcohol abuse, in remission   . Anxiety   . Arthritis   . Avascular necrosis (HCC)   . Depression   . GERD (gastroesophageal reflux disease)   . History of alcohol abuse   . Hyperlipidemia   . Hypertension   . IFG (impaired fasting glucose)   . Overweight     Patient Active Problem List   Diagnosis Date Noted  . Medication monitoring encounter 01/14/2017  . Abnormal nuclear stress test 12/31/2015  . Snoring 11/06/2015  . Elevated liver function tests 05/19/2015  . Fatty liver 05/19/2015  . Elevated serum GGT level 05/17/2015  . IFG (impaired fasting glucose)   . Hyperlipidemia   . Essential hypertension   . Depression   . Anxiety   . GERD (gastroesophageal reflux disease)   . Overweight   . Alcohol abuse, episodic   . 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 CARDIAC CATHETERIZATION N/A 01/02/2016   Procedure: Left Heart Cath and Coronary Angiography;  Surgeon: Marykay Lexavid  W Harding, MD;  [facility];  Service: Cardiovascular;  Laterality: N/A;  . CARPAL TUNNEL RELEASE Bilateral    right x 2, left x 1  . DIALYSIS/PERMA CATHETER INSERTION N/A 03/23/2018   Procedure: DIALYSIS/PERMA CATHETER INSERTION;  Surgeon: Renford DillsSchnier, Gregory G, MD;  Location: [facility] INVASIVE CV LAB;  Service: Cardiovascular;  Laterality: N/A;  . TESTICLE TORSION REDUCTION    . TOTAL HIP ARTHROPLASTY Left 01/30/2014   Procedure: LEFT TOTAL HIP ARTHROPLASTY ANTERIOR APPROACH;  Surgeon: Shelda PalMatthew D Olin, MD;  Location: [facility] ORS;  Service: Orthopedics;  Laterality: Left;  Marland Kitchen. VASECTOMY  1995  . VASECTOMY REVERSAL  1997        Home Medications    Prior to Admission medications   Medication Sig Start Date End Date Taking? Authorizing Provider  amLODipine (NORVASC) 10 MG tablet Take 1 tablet (10 mg total) by mouth daily. 01/20/19   Kerman PasseyLada, [name], MD  aspirin EC 81 MG tablet Take 81 mg by mouth daily.    [provider]  busPIRone (BUSPAR) 7.5 MG tablet Take 1 tablet (7.5 mg total) by mouth at bedtime. 03/22/19   Doren CustardBoyce, Emily E, FNP  escitalopram (LEXAPRO) 10 MG tablet 1 tab Effexor + 1/2 tab Lexapro once daily x7 days, then 1/2tab effexor + 1tab Lexapro once daily x7days, then STOP effexor, take 1 tablet Lexapro once  daily 03/22/19   Doren CustardBoyce, Emily E, FNP  esomeprazole (NEXIUM) 40 MG capsule Take 1 capsule (40 mg total) by mouth daily. 01/20/19   Kerman PasseyLada, [name], MD  hydrALAZINE (APRESOLINE) 10 MG tablet Take 1 tablet (10 mg total) by mouth every 8 (eight) hours. 01/20/19   Kerman PasseyLada, [name], MD  metoprolol succinate (TOPROL-XL) 100 MG 24 hr tablet Take 1 tablet (100 mg total) by mouth daily. 01/20/19   Kerman PasseyLada, [name], MD    Family History Family History  Problem Relation Age of Onset  . Hypertension Mother   . Diabetes Mother   . Hypertension Father   . Stroke Father   . Cancer Neg Hx   . COPD Neg Hx   . Heart disease Neg Hx     Social History Social History   Tobacco Use   . Smoking status: Current Every Day Smoker    Packs/day: 1.00    Years: 30.00    Pack years: 30.00    Types: Cigarettes  . Smokeless tobacco: Never Used  Substance Use Topics  . Alcohol use: Yes    Frequency: Never    Comment: 1/5 gallon of liquor daily  . Drug use: No     Allergies   Ace inhibitors, Captopril, Enalapril, Fosinopril, Lisinopril, and Ramipril   Review of Systems Review of Systems  All other systems reviewed and are negative.    Physical Exam Updated Vital Signs BP (!) 156/108 (BP Location: Right Arm)   Pulse 94   Temp 98.4 F (36.9 C) (Oral)   Resp 16   Ht 5\' 8"  (1.727 m)   Wt 85.7 kg   SpO2 97%   BMI 28.74 kg/m   Physical Exam Vitals signs and nursing note reviewed.  Constitutional:      Appearance: He is well-developed.  HENT:     Head: Normocephalic and atraumatic.  Eyes:     Conjunctiva/sclera: Conjunctivae normal.  Neck:     Musculoskeletal: Neck supple.  Cardiovascular:     Rate and Rhythm: Normal rate and regular rhythm.     Heart sounds: No murmur.  Pulmonary:     Effort: Pulmonary effort is normal. No respiratory distress.     Breath sounds: Normal breath sounds.  Abdominal:     Palpations: Abdomen is soft.     Tenderness: There is no abdominal tenderness.  Musculoskeletal: Normal range of motion.  Skin:    General: Skin is warm and dry.  Neurological:     Mental Status: He is alert and oriented to person, place, and time.  Psychiatric:        Mood and Affect: Mood normal.        Behavior: Behavior normal.        Thought Content: Thought content normal.        Judgment: Judgment normal.     Comments: Clinically intoxicated      ED Treatments / Results  Labs (all labs ordered are listed, but only abnormal results are displayed) Labs Reviewed  COMPREHENSIVE METABOLIC PANEL  ETHANOL  SALICYLATE LEVEL  ACETAMINOPHEN LEVEL  CBC  RAPID URINE DRUG SCREEN, HOSP PERFORMED    EKG None  Radiology No results found.   Procedures Procedures (including critical care time)  Medications Ordered in ED Medications  LORazepam (ATIVAN) injection 0-4 mg (has no administration in time range)    Or  LORazepam (ATIVAN) tablet 0-4 mg (has no administration in time range)  LORazepam (ATIVAN) injection 0-4 mg (has no administration in time  range)    Or  LORazepam (ATIVAN) tablet 0-4 mg (has no administration in time range)  thiamine (VITAMIN B-1) tablet 100 mg (has no administration in time range)    Or  thiamine (B-1) injection 100 mg (has no administration in time range)     Initial Impression / Assessment and Plan / ED Course  I have reviewed the triage vital signs and the nursing notes.  Pertinent labs & imaging results that were available during my care of the patient were reviewed by me and considered in my medical decision making (see chart for details).        Patient under IVC for suicidal ideations.  IVC by family.  Intoxicated with alcohol.  TTS consult pending.  Final Clinical Impressions(s) / ED Diagnoses   Final diagnoses:  Encounter for medical screening examination  Alcohol abuse  Suicidal ideation    ED Discharge Orders    None       Montine Circle, PA-C 59/74/16 [id number]    Delora Fuel, MD 36/46/80 [phone]

## 2019-04-06 NOTE — ED Notes (Signed)
Bed: WTR5 Expected date:  Expected time:  Means of arrival:  Comments: 

## 2019-04-07 ENCOUNTER — Encounter (HOSPITAL_COMMUNITY): Payer: Self-pay

## 2019-04-07 ENCOUNTER — Inpatient Hospital Stay (HOSPITAL_COMMUNITY)
Admission: AD | Admit: 2019-04-07 | Discharge: 2019-04-10 | DRG: 897 | Disposition: A | Discharge status: Home / Self Care | Payer: Federal, State, Local not specified - Other | Attending: Psychiatry | Admitting: Psychiatry

## 2019-04-07 DIAGNOSIS — Z1159 Encounter for screening for other viral diseases: Secondary | ICD-10-CM

## 2019-04-07 DIAGNOSIS — K219 Gastro-esophageal reflux disease without esophagitis: Secondary | ICD-10-CM | POA: Diagnosis present

## 2019-04-07 DIAGNOSIS — F1994 Other psychoactive substance use, unspecified with psychoactive substance-induced mood disorder: Secondary | ICD-10-CM | POA: Diagnosis present

## 2019-04-07 DIAGNOSIS — G4700 Insomnia, unspecified: Secondary | ICD-10-CM | POA: Diagnosis present

## 2019-04-07 DIAGNOSIS — Z7289 Other problems related to lifestyle: Secondary | ICD-10-CM

## 2019-04-07 DIAGNOSIS — K760 Fatty (change of) liver, not elsewhere classified: Secondary | ICD-10-CM | POA: Diagnosis present

## 2019-04-07 DIAGNOSIS — F17210 Nicotine dependence, cigarettes, uncomplicated: Secondary | ICD-10-CM | POA: Diagnosis present

## 2019-04-07 DIAGNOSIS — I10 Essential (primary) hypertension: Secondary | ICD-10-CM | POA: Diagnosis present

## 2019-04-07 DIAGNOSIS — Y908 Blood alcohol level of 240 mg/100 ml or more: Secondary | ICD-10-CM | POA: Diagnosis not present

## 2019-04-07 LAB — RAPID URINE DRUG SCREEN, HOSP PERFORMED
Amphetamines: NOT DETECTED
Barbiturates: NOT DETECTED
Benzodiazepines: NOT DETECTED
Cocaine: NOT DETECTED
Opiates: NOT DETECTED
Tetrahydrocannabinol: NOT DETECTED

## 2019-04-07 LAB — SARS CORONAVIRUS 2 BY RT PCR (HOSPITAL ORDER, PERFORMED IN ~~LOC~~ HOSPITAL LAB): SARS Coronavirus 2: NEGATIVE

## 2019-04-07 MED ORDER — ONDANSETRON HCL 4 MG PO TABS: 4.0000 mg | ORAL_TABLET | Freq: Once | ORAL | Status: DC

## 2019-04-07 MED ORDER — NICOTINE 21 MG/24HR TD PT24
21.0000 mg | MEDICATED_PATCH | Freq: Every day | TRANSDERMAL | Status: DC
  Administered 2019-04-07 – 2019-04-09 (×3): 21 mg via TRANSDERMAL
  Filled 2019-04-07 (×4): qty 1

## 2019-04-07 MED ORDER — LORAZEPAM 1 MG PO TABS
2.0000 mg | ORAL_TABLET | Freq: Once | ORAL | Status: AC
  Administered 2019-04-07: 2 mg via ORAL
  Filled 2019-04-07: qty 2

## 2019-04-07 MED ORDER — VENLAFAXINE HCL ER 75 MG PO CP24
75.0000 mg | ORAL_CAPSULE | Freq: Every day | ORAL | Status: DC
  Administered 2019-04-07 – 2019-04-10 (×4): 75 mg via ORAL
  Filled 2019-04-07: qty 1
  Filled 2019-04-07: qty 7
  Filled 2019-04-07 (×5): qty 1

## 2019-04-07 MED ORDER — VITAMIN B-1 100 MG PO TABS
100.0000 mg | ORAL_TABLET | Freq: Every day | ORAL | Status: DC
  Administered 2019-04-08 – 2019-04-10 (×3): 100 mg via ORAL
  Filled 2019-04-07 (×5): qty 1

## 2019-04-07 MED ORDER — PANTOPRAZOLE SODIUM 40 MG PO TBEC
40.0000 mg | DELAYED_RELEASE_TABLET | Freq: Every day | ORAL | Status: DC
  Administered 2019-04-07 – 2019-04-10 (×4): 40 mg via ORAL
  Filled 2019-04-07 (×7): qty 1

## 2019-04-07 MED ORDER — AMLODIPINE BESYLATE 10 MG PO TABS
10.0000 mg | ORAL_TABLET | Freq: Every day | ORAL | Status: DC
  Administered 2019-04-08 – 2019-04-10 (×3): 10 mg via ORAL
  Filled 2019-04-07 (×4): qty 1
  Filled 2019-04-07: qty 7

## 2019-04-07 MED ORDER — ALUM & MAG HYDROXIDE-SIMETH 200-200-20 MG/5ML PO SUSP: 30.0000 mL | ORAL | Status: DC | PRN

## 2019-04-07 MED ORDER — LORAZEPAM 2 MG/ML IJ SOLN: 2.0000 mg | Freq: Once | INTRAMUSCULAR | Status: AC

## 2019-04-07 MED ORDER — LORAZEPAM 2 MG/ML IJ SOLN: 0.0000 mg | Freq: Two times a day (BID) | INTRAMUSCULAR | Status: DC

## 2019-04-07 MED ORDER — ONDANSETRON HCL 4 MG PO TABS: 4.0000 mg | ORAL_TABLET | Freq: Once | ORAL | Status: DC | PRN

## 2019-04-07 MED ORDER — METOPROLOL SUCCINATE ER 100 MG PO TB24
100.0000 mg | ORAL_TABLET | Freq: Every day | ORAL | Status: DC
  Administered 2019-04-08: 100 mg via ORAL
  Filled 2019-04-07 (×3): qty 1

## 2019-04-07 MED ORDER — LORAZEPAM 1 MG PO TABS: 0.0000 mg | ORAL_TABLET | Freq: Two times a day (BID) | ORAL | Status: DC

## 2019-04-07 MED ORDER — CHLORDIAZEPOXIDE HCL 25 MG PO CAPS
25.0000 mg | ORAL_CAPSULE | Freq: Four times a day (QID) | ORAL | Status: DC | PRN
  Administered 2019-04-07: 25 mg via ORAL
  Filled 2019-04-07: qty 1

## 2019-04-07 MED ORDER — AMLODIPINE BESYLATE 5 MG PO TABS
10.0000 mg | ORAL_TABLET | Freq: Every day | ORAL | Status: DC
  Administered 2019-04-07: 10:00:00 10 mg via ORAL
  Filled 2019-04-07: qty 2

## 2019-04-07 MED ORDER — LORAZEPAM 1 MG PO TABS: 0.0000 mg | ORAL_TABLET | Freq: Four times a day (QID) | ORAL | Status: DC

## 2019-04-07 MED ORDER — LORAZEPAM 2 MG/ML IJ SOLN: 0.0000 mg | Freq: Four times a day (QID) | INTRAMUSCULAR | Status: DC

## 2019-04-07 MED ORDER — FOLIC ACID 1 MG PO TABS
1.0000 mg | ORAL_TABLET | Freq: Every day | ORAL | Status: DC
  Administered 2019-04-07 – 2019-04-10 (×4): 1 mg via ORAL
  Filled 2019-04-07 (×7): qty 1

## 2019-04-07 MED ORDER — ACETAMINOPHEN 325 MG PO TABS: 650.0000 mg | ORAL_TABLET | Freq: Four times a day (QID) | ORAL | Status: DC | PRN

## 2019-04-07 MED ORDER — MAGNESIUM HYDROXIDE 400 MG/5ML PO SUSP: 30.0000 mL | Freq: Every day | ORAL | Status: DC | PRN

## 2019-04-07 MED ORDER — HYDROXYZINE HCL 25 MG PO TABS
25.0000 mg | ORAL_TABLET | Freq: Three times a day (TID) | ORAL | Status: DC | PRN
  Administered 2019-04-07 – 2019-04-09 (×3): 25 mg via ORAL
  Filled 2019-04-07 (×3): qty 1

## 2019-04-07 MED ORDER — HYDRALAZINE HCL 10 MG PO TABS
10.0000 mg | ORAL_TABLET | Freq: Four times a day (QID) | ORAL | Status: DC
  Administered 2019-04-07 – 2019-04-08 (×3): 10 mg via ORAL
  Filled 2019-04-07 (×12): qty 1

## 2019-04-07 MED ORDER — TRAZODONE HCL 50 MG PO TABS
50.0000 mg | ORAL_TABLET | Freq: Every evening | ORAL | Status: DC | PRN
  Administered 2019-04-07 – 2019-04-09 (×3): 50 mg via ORAL
  Filled 2019-04-07: qty 1
  Filled 2019-04-07: qty 7
  Filled 2019-04-07 (×2): qty 1

## 2019-04-07 MED ORDER — THIAMINE HCL 100 MG/ML IJ SOLN: 100.0000 mg | Freq: Every day | INTRAMUSCULAR | Status: DC

## 2019-04-07 MED ORDER — ONDANSETRON HCL 4 MG PO TABS
4.0000 mg | ORAL_TABLET | Freq: Once | ORAL | Status: AC | PRN
  Administered 2019-04-07: 09:00:00 4 mg via ORAL
  Filled 2019-04-07: qty 1

## 2019-04-07 MED ORDER — METOPROLOL SUCCINATE ER 100 MG PO TB24
100.0000 mg | ORAL_TABLET | Freq: Every day | ORAL | Status: DC
  Administered 2019-04-07: 100 mg via ORAL
  Filled 2019-04-07: qty 1

## 2019-04-07 NOTE — BH Assessment (Addendum)
Assessment Note  [name] White is an 48 y.o. male that presents this date with IVC. Per IVC patient reported he "wanted to drink himself to death." Patient expressed he had the desire to "join his truelove" referring to his deceased wife who passed away 8 years ago. Patient denies he made that statement and denies any plan or intent to self harm. Patient does express S/I although is vague in reference to plan. Patient presents with a pleasant affect and is oriented x 4 speaking in a clear voice with normal tone. Patient reports daily alcohol use for the last 5 months stating he has been consuming one fifth of liquor or more with last use on 04/06/19 when he drank "almost a half a gallon." Patient reports current withdrawals to include: tremors, agitation and nausea. Patient denies any prior attempts or gestures at self harm. Patient states he was diagnosed with depression over 5 years ago by his PCP but denied any current medication interventions to assist with symptom management. Patient reports ongoing depressive symptoms to include: feeling hopeless and isolating. Per notes, patient reported he had "a long history of alcohol use" and states he drinks approximately half a gallon of alcohol per day. Patient states the last time he drank was approximately 4:30 this morning. He denies any illicit drug usage. Patient denies any H/I or AVH. Patient's UDS is pending. He denies any hallucinations. IVC papers taken out by family, who states that the patient is trying to "drink himself to death." Patient has expressed a desire to "join his truelove," who is dead. Family reports that he drinks approximately a half a gallon of liquor per day. [facility] who transported patient to ED reports that they are very familiar with the patient and states he is always intoxicated. Denies any drug use. Denies any auditory or visual hallucinations. Case was staffed with Arville CareParks NP who recommended a inpatient admission to assist with  stabilization.     Diagnosis: F33.2 MDD recurrent without psychotic features, severe, Alcohol abuse  Past Medical History:  Past Medical History:  Diagnosis Date  . Alcohol abuse, in remission   . Anxiety   . Arthritis   . Avascular necrosis (HCC)   . Depression   . GERD (gastroesophageal reflux disease)   . History of alcohol abuse   . Hyperlipidemia   . Hypertension   . IFG (impaired fasting glucose)   . Overweight     Past Surgical History:  Procedure Laterality Date  . CARDIAC CATHETERIZATION N/A 01/02/2016   Procedure: Left Heart Cath and Coronary Angiography;  Surgeon: Marykay Lexavid W Harding, MD;  [facility];  Service: Cardiovascular;  Laterality: N/A;  . CARPAL TUNNEL RELEASE Bilateral    right x 2, left x 1  . DIALYSIS/PERMA CATHETER INSERTION N/A 03/23/2018   Procedure: DIALYSIS/PERMA CATHETER INSERTION;  Surgeon: Renford DillsSchnier, Gregory G, MD;  Location: [facility] INVASIVE CV LAB;  Service: Cardiovascular;  Laterality: N/A;  . TESTICLE TORSION REDUCTION    . TOTAL HIP ARTHROPLASTY Left 01/30/2014   Procedure: LEFT TOTAL HIP ARTHROPLASTY ANTERIOR APPROACH;  Surgeon: Shelda PalMatthew D Olin, MD;  Location: [facility] ORS;  Service: Orthopedics;  Laterality: Left;  Marland Kitchen. VASECTOMY  1995  . VASECTOMY REVERSAL  1997    Family History:  Family History  Problem Relation Age of Onset  . Hypertension Mother   . Diabetes Mother   . Hypertension Father   . Stroke Father   . Cancer Neg Hx   . COPD Neg Hx   .  Heart disease Neg Hx     Social History:  reports that he has been smoking cigarettes. He has a 30.00 pack-year smoking history. He has never used smokeless tobacco. He reports current alcohol use. He reports that he does not use drugs.  Additional Social History:  Alcohol / Drug Use Pain Medications: See MAR Prescriptions: See MAR Over the Counter: See MAR History of alcohol / drug use?: Yes Longest period of sobriety (when/how long): Unknown Negative Consequences of Use: Personal  relationships Withdrawal Symptoms: Agitation, Irritability, Nausea / Vomiting Substance #1 Name of Substance 1: Alcohol 1 - Age of First Use: 19 1 - Amount (size/oz): Varies 1 - Frequency: Varies 1 - Duration: Varies 1 - Last Use / Amount: 04/06/19 1/5 of liquor  CIWA: CIWA-Ar BP: (!) 163/112 Pulse Rate: 78 Nausea and Vomiting: no nausea and no vomiting Tactile Disturbances: none Tremor: no tremor Auditory Disturbances: not present Paroxysmal Sweats: no sweat visible Visual Disturbances: not present Anxiety: no anxiety, at ease Headache, Fullness in Head: none present Agitation: normal activity Orientation and Clouding of Sensorium: oriented and can do serial additions CIWA-Ar Total: 0 COWS:    Allergies:  Allergies  Allergen Reactions  . Ace Inhibitors Anaphylaxis  . Captopril Anaphylaxis  . Enalapril Anaphylaxis  . Fosinopril Anaphylaxis  . Lisinopril Anaphylaxis  . Ramipril Anaphylaxis    Home Medications: (Not in a hospital admission)   OB/GYN Status:  No LMP for male patient.  General Assessment Data Location of Assessment: [facility] ED TTS Assessment: In system Is this a Tele or Face-to-Face Assessment?: Face-to-Face Is this an Initial Assessment or a Re-assessment for this encounter?: Initial Assessment Patient Accompanied by:: N/A Language Other than English: No Living Arrangements: Other (Comment)(Home) What gender do you identify as?: Male Marital status: Married Living Arrangements: Spouse/significant other Can pt return to current living arrangement?: Yes Admission Status: Involuntary Petitioner: Family member Is patient capable of signing voluntary admission?: Yes Referral Source: Self/Family/Friend Insurance type: Self Pay  Medical Screening Exam ([place]) Medical Exam completed: Yes  Crisis Care Plan Living Arrangements: Spouse/significant other Legal Guardian: (NA) Name of Psychiatrist: None Name of Therapist: None  Education  Status Is patient currently in school?: No Is the patient employed, unemployed or receiving disability?: Unemployed  Risk to self with the past 6 months Suicidal Ideation: No Has patient been a risk to self within the past 6 months prior to admission? : No Suicidal Intent: No Has patient had any suicidal intent within the past 6 months prior to admission? : No Is patient at risk for suicide?: No, but patient needs Medical Clearance Suicidal Plan?: No Has patient had any suicidal plan within the past 6 months prior to admission? : No Access to Means: No What has been your use of drugs/alcohol within the last 12 months?: NA Previous Attempts/Gestures: No How many times?: 0 Other Self Harm Risks: (NA) Triggers for Past Attempts: (NA) Intentional Self Injurious Behavior: None Family Suicide History: No Recent stressful life event(s): Conflict (Comment)(Conflict with family over ETOH use) Persecutory voices/beliefs?: No Depression: No Depression Symptoms: (Denies) Substance abuse history and/or treatment for substance abuse?: No Suicide prevention information given to non-admitted patients: Not applicable  Risk to Others within the past 6 months Homicidal Ideation: No Does patient have any lifetime risk of violence toward others beyond the six months prior to admission? : No Thoughts of Harm to Others: No Current Homicidal Intent: No Current Homicidal Plan: No Access to Homicidal Means: No Identified Victim: NA  History of harm to others?: No Assessment of Violence: None Noted Violent Behavior Description: NA Does patient have access to weapons?: No Criminal Charges Pending?: No Does patient have a court date: No Is patient on probation?: No  Psychosis Hallucinations: None noted Delusions: None noted  Mental Status Report Appearance/Hygiene: In scrubs Eye Contact: Fair Motor Activity: Freedom of movement Speech: Logical/coherent Level of Consciousness: Quiet/awake Mood:  Pleasant Affect: Appropriate to circumstance Anxiety Level: Minimal Thought Processes: Relevant, Irrelevant Judgement: Partial Orientation: Person, Place, Time Obsessive Compulsive Thoughts/Behaviors: None  Cognitive Functioning Concentration: Normal Memory: Recent Intact, Remote Intact Is patient IDD: No Insight: Fair Impulse Control: Fair Appetite: Fair Have you had any weight changes? : No Change Sleep: No Change Total Hours of Sleep: 6 Vegetative Symptoms: None  ADLScreening [facility] Assessment Services) Patient's cognitive ability adequate to safely complete daily activities?: Yes Patient able to express need for assistance with ADLs?: Yes Independently performs ADLs?: Yes (appropriate for developmental age)  Prior Inpatient Therapy Prior Inpatient Therapy: No  Prior Outpatient Therapy Prior Outpatient Therapy: No Does patient have an ACCT team?: No Does patient have Intensive In-House Services?  : No Does patient have [facility] services? : No Does patient have P4CC services?: No  ADL Screening (condition at time of admission) Patient's cognitive ability adequate to safely complete daily activities?: Yes Is the patient deaf or have difficulty hearing?: No Does the patient have difficulty seeing, even when wearing glasses/contacts?: No Does the patient have difficulty concentrating, remembering, or making decisions?: No Patient able to express need for assistance with ADLs?: Yes Does the patient have difficulty dressing or bathing?: No Independently performs ADLs?: Yes (appropriate for developmental age) Does the patient have difficulty walking or climbing stairs?: No Weakness of Legs: None Weakness of Arms/Hands: None  Home Assistive Devices/Equipment Home Assistive Devices/Equipment: None  Therapy Consults (therapy consults require a physician order) PT Evaluation Needed: No OT Evalulation Needed: No SLP Evaluation Needed: No Abuse/Neglect Assessment (Assessment  to be complete while patient is alone) Physical Abuse: Denies Verbal Abuse: Denies Sexual Abuse: Denies Exploitation of patient/patient's resources: Denies Self-Neglect: Denies Values / Beliefs Cultural Requests During Hospitalization: None Spiritual Requests During Hospitalization: None Consults Spiritual Care Consult Needed: No Social Work Consult Needed: No Merchant navy officerAdvance Directives (For Healthcare) Does Patient Have a Medical Advance Directive?: No Would patient like information on creating a medical advance directive?: No - Patient declined          Disposition: Case was staffed with Arville CareParks NP who recommended a inpatient admission to assist with stabilization.    Disposition Initial Assessment Completed for this Encounter: Yes  On Site Evaluation by:   Reviewed with Physician:    Alfredia Fergusonavid L Tamu Golz 04/07/2019 8:06 AM

## 2019-04-07 NOTE — Patient Outreach (Signed)
ED Peer Support Specialist Patient Intake (Complete at intake & 30-60 Day Follow-up)  Name: [name] White  MRN: [id number]  Age: 48 y.o.   Date of Admission: 04/07/2019  Intake: Initial Comments:      Primary Reason Admitted: alcohol use, depression, SI  Lab values: Alcohol/ETOH: Positive Positive UDS? Drug screen not completed Amphetamines: Drug screen not completed Barbiturates: Drug screen not completed Benzodiazepines: Drug screen not completed Cocaine: Drug screen not completed Opiates: Drug screen not completed Cannabinoids: Drug screen not completed  Demographic information: Gender: Male Ethnicity: White Marital Status: Married Insurance underwriter Status: Uninsured/Self-pay Ecologist (Work Neurosurgeon, Physicist, medical, etc.: No Lives with: Partner/Spouse Living situation: House/Apartment  Reported Patient History: Patient reported health conditions: Depression, Other (comment), Hypertension(anxiety) Patient aware of HIV and hepatitis status: Yes (comment)(Negative)  In past year, has patient visited ED for any reason? Yes  Number of ED visits: 1  Reason(s) for visit: alcohol use at [facility]  In past year, has patient been hospitalized for any reason? No  Number of hospitalizations:    Reason(s) for hospitalization:    In past year, has patient been arrested? No  Number of arrests:    Reason(s) for arrest:    In past year, has patient been incarcerated? No  Number of incarcerations:    Reason(s) for incarceration:    In past year, has patient received medication-assisted treatment? No  In past year, patient received the following treatments:    In past year, has patient received any harm reduction services? No  Did this include any of the following?    In past year, has patient received care from a mental health provider for diagnosis other than SUD? No  In past year, is this first time patient has overdosed? (has not  overdosed)  Number of past overdoses:    In past year, is this first time patient has been hospitalized for an overdose? (has not overdosed in the past year)  Number of hospitalizations for overdose(s):    Is patient currently receiving treatment for a mental health diagnosis? No  Patient reports experiencing difficulty participating in SUD treatment: No    Most important reason(s) for this difficulty?    Has patient received prior services for treatment? No  In past, patient has received services from following agencies:    Plan of Care:  Suggested follow up at these agencies/treatment centers: Other (comment)(Patient is under IVC and has been reccomended for inpatient psychiatric hospital admission.)  Other information: [facility] met with the patient in order to provide substance use recovery support and help with getting connected to substance use treatment resources. Per Jinny Blossom NP, patient has been recommended for an inpatient psychiatric hospital admission. CPSS explained to the patient on how a inpatient psychiatric hospital can help assist the patient further with managing alcohol withdrawals and help with a possible referral to outpatient/residential substance use treatment for an aftercare plan. CPSS provided CPSS contact information. CPSS strongly encouraged the patient to continue to stay in contact with CPSS if needed for further substance use recovery support.    Mason Jim, [place]  04/07/2019 11:55 AM

## 2019-04-07 NOTE — Progress Notes (Signed)
Pt refuses to take scheduled hydralazine, stating it makes his stomach hurt. Pt reported he was taken off of hydralazine by his doctor.

## 2019-04-07 NOTE — BH Assessment (Signed)
[facility] Assessment Progress Note  Per Buford Dresser, DO, this pt requires psychiatric hospitalization.  Nonah Mattes, RN, [facility] has assigned pt to [facility] Rm 303-2.  Pt presents under IVC initiated by pt's wife, and upheld by Dr Mariea Clonts, and IVC documents have been faxed to [facility].  Pt's nurse has been notified, and agrees to call report to [phone].  Pt is to be transported via Event organiser.   Jalene Mullet, [place] Coordinator [phone]

## 2019-04-07 NOTE — Tx Team (Signed)
Initial Treatment Plan 04/07/2019 2:25 PM Taber Sweetser Rimel [id number]    PATIENT STRESSORS: Financial difficulties Marital or family conflict Substance abuse   PATIENT STRENGTHS: Ability for insight Active sense of humor Average or above average intelligence Capable of independent living Occupational psychologist fund of knowledge Motivation for treatment/growth Physical Health Work skills   PATIENT IDENTIFIED PROBLEMS: "depression"  "anxiety"  "marriage issues"                 DISCHARGE CRITERIA:  Ability to meet basic life and health needs Improved stabilization in mood, thinking, and/or behavior Medical problems require only outpatient monitoring Motivation to continue treatment in a less acute level of care  PRELIMINARY DISCHARGE PLAN: Attend PHP/IOP Attend 12-step recovery group Return to previous living arrangement  PATIENT/FAMILY INVOLVEMENT: This treatment plan has been presented to and reviewed with the patient, Osmin L Smucker.  The patient and family have been given the opportunity to ask questions and make suggestions.  Baron Sane, RN 04/07/2019, 2:25 PM

## 2019-04-07 NOTE — BH Assessment (Signed)
[facility] Assessment Progress Note Case was staffed with Parks NP who recommended a inpatient admission to assist with stabilization.      

## 2019-04-07 NOTE — H&P (Signed)
Psychiatric Admission Assessment Adult  Patient Identification: [name] EvenerHerbert L White MRN:  [id number] Date of Evaluation:  04/07/2019 Chief Complaint:  "Excessive drinking" Principal Diagnosis: <principal problem not specified> Diagnosis:  Active Problems:   Substance induced mood disorder (HCC)  History of Present Illness: [name] is a 48 year old male with history of alcohol use disorder, HTN, HLD, GERD, and fatty liver, presenting for treatment under IVC for reported suicidal statements. According to IVC patient stated he "wanted to drink himself to death" and be with his "true love," his ex-wife who passed away. Patient denies making suicidal statements. He states he has been having increased conflict with his wife and went on a five day drinking binge. He states that he thinks his wife sent suicidal statement from his cell phone to his aunt in order to commit him because she was upset about his drinking. He does admit to problem with binge drinking and reports drinking a fifth each day for the past five days. He denies alcohol dependence prior to the last five days, states he was drinking "maybe twice a week" before. He was seen in the ED in March 2020 requesting detox but chose to leave before being admitted. He is being treated for depression with Effexor XR 75 mg daily from his PCP and reports compliance. He denies feeling depressed or anxious recently. He does report problems with poor sleep and energy. He complains of withdrawal symptoms- shakiness, nausea, restlessness. Denies other drug use. BAL 346. UDS negative. Denies SI/HI/AVH.  Associated Signs/Symptoms: Depression Symptoms:  insomnia, fatigue, (Hypo) Manic Symptoms:  denies Anxiety Symptoms:  denies Psychotic Symptoms:  denies PTSD Symptoms: Negative Total Time spent with patient: 30 minutes  Past Psychiatric History: Denies prior psychiatric hospitalizations. He reports he was admitted for detox at [facility] six years ago.  Denies prior rehab.  Is the patient at risk to self? Yes.    Has the patient been a risk to self in the past 6 months? No.  Has the patient been a risk to self within the distant past? No.  Is the patient a risk to others? No.  Has the patient been a risk to others in the past 6 months? No.  Has the patient been a risk to others within the distant past? No.   Prior Inpatient Therapy:   Prior Outpatient Therapy:    Alcohol Screening:   Substance Abuse History in the last 12 months:  Yes.   Consequences of Substance Abuse: Medical Consequences:  fatty liver, hx acute renal failure Family Consequences:  conflict with wife Withdrawal Symptoms:   Cramps Nausea Tremors Previous Psychotropic Medications: Yes  Psychological Evaluations: No  Past Medical History:  Past Medical History:  Diagnosis Date  . Alcohol abuse, in remission   . Anxiety   . Arthritis   . Avascular necrosis (HCC)   . Depression   . GERD (gastroesophageal reflux disease)   . History of alcohol abuse   . Hyperlipidemia   . Hypertension   . IFG (impaired fasting glucose)   . Overweight     Past Surgical History:  Procedure Laterality Date  . CARDIAC CATHETERIZATION N/A 01/02/2016   Procedure: Left Heart Cath and Coronary Angiography;  Surgeon: Marykay Lexavid W Harding, MD;  [facility];  Service: Cardiovascular;  Laterality: N/A;  . CARPAL TUNNEL RELEASE Bilateral    right x 2, left x 1  . DIALYSIS/PERMA CATHETER INSERTION N/A 03/23/2018   Procedure: DIALYSIS/PERMA CATHETER INSERTION;  Surgeon: Renford DillsSchnier, Gregory G, MD;  Location: [facility] INVASIVE CV LAB;  Service: Cardiovascular;  Laterality: N/A;  . TESTICLE TORSION REDUCTION    . TOTAL HIP ARTHROPLASTY Left 01/30/2014   Procedure: LEFT TOTAL HIP ARTHROPLASTY ANTERIOR APPROACH;  Surgeon: Shelda Pal, MD;  Location: [facility] ORS;  Service: Orthopedics;  Laterality: Left;  Marland Kitchen VASECTOMY  1995  . VASECTOMY REVERSAL  1997   Family History:  Family History   Problem Relation Age of Onset  . Hypertension Mother   . Diabetes Mother   . Hypertension Father   . Stroke Father   . Cancer Neg Hx   . COPD Neg Hx   . Heart disease Neg Hx    Family Psychiatric  History: Denies Tobacco Screening:   Social History:  Social History   Substance and Sexual Activity  Alcohol Use Yes  . Frequency: Never   Comment: 1/5 gallon of liquor daily     Social History   Substance and Sexual Activity  Drug Use No    Additional Social History:                           Allergies:   Allergies  Allergen Reactions  . Ace Inhibitors Anaphylaxis  . Captopril Anaphylaxis  . Enalapril Anaphylaxis  . Fosinopril Anaphylaxis  . Lisinopril Anaphylaxis  . Ramipril Anaphylaxis   Lab Results:  Results for orders placed or performed during the hospital encounter of 04/06/19 (from the past 48 hour(s))  Rapid urine drug screen (hospital performed)     Status: None   Collection Time: 04/06/19  3:41 AM  Result Value Ref Range   Opiates NONE DETECTED NONE DETECTED   Cocaine NONE DETECTED NONE DETECTED   Benzodiazepines NONE DETECTED NONE DETECTED   Amphetamines NONE DETECTED NONE DETECTED   Tetrahydrocannabinol NONE DETECTED NONE DETECTED   Barbiturates NONE DETECTED NONE DETECTED    Comment: (NOTE) DRUG SCREEN FOR MEDICAL PURPOSES ONLY.  IF CONFIRMATION IS NEEDED FOR ANY PURPOSE, NOTIFY LAB WITHIN 5 DAYS. LOWEST DETECTABLE LIMITS FOR URINE DRUG SCREEN Drug Class                     Cutoff (ng/mL) Amphetamine and metabolites    1000 Barbiturate and metabolites    200 Benzodiazepine                 200 Tricyclics and metabolites     300 Opiates and metabolites        300 Cocaine and metabolites        300 THC                            50 Performed at [facility], [address] [place], [place] [address]   Comprehensive metabolic panel     Status: Abnormal   Collection Time: 04/06/19 10:47 PM  Result Value Ref Range    Sodium 142 135 - 145 mmol/L   Potassium 3.5 3.5 - 5.1 mmol/L   Chloride 105 98 - 111 mmol/L   CO2 22 22 - 32 mmol/L   Glucose, Bld 117 (H) 70 - 99 mg/dL   BUN 15 6 - 20 mg/dL   Creatinine, Ser 6.04 0.61 - 1.24 mg/dL   Calcium 9.0 8.9 - 54.0 mg/dL   Total Protein 8.5 (H) 6.5 - 8.1 g/dL   Albumin 4.8 3.5 - 5.0 g/dL   AST 29 15 - 41 U/L  ALT 32 0 - 44 U/L   Alkaline Phosphatase 96 38 - 126 U/L   Total Bilirubin 0.4 0.3 - 1.2 mg/dL   GFR calc non Af Amer >60 >60 mL/min   GFR calc Af Amer >60 >60 mL/min   Anion gap 15 5 - 15    Comment: Performed at [facility], [place] [address] [place], [place] [address]  Ethanol     Status: Abnormal   Collection Time: 04/06/19 10:47 PM  Result Value Ref Range   Alcohol, Ethyl (B) 346 (HH) <10 mg/dL    Comment: CRITICAL RESULT CALLED TO, READ BACK BY AND VERIFIED WITH: RN J FRICKIE AT 2343 04/06/19 CRUICKSHANKA (NOTE) Lowest detectable limit for serum alcohol is 10 mg/dL. For medical purposes only. Performed at [facility], [place] [address] [place], [place] [address]   Salicylate level     Status: None   Collection Time: 04/06/19 10:47 PM  Result Value Ref Range   Salicylate Lvl <3.5 2.8 - 30.0 mg/dL    Comment: Performed at [facility], [place] [address] [place], [place] [address]  Acetaminophen level     Status: Abnormal   Collection Time: 04/06/19 10:47 PM  Result Value Ref Range   Acetaminophen (Tylenol), Serum <10 (L) 10 - 30 ug/mL    Comment: (NOTE) Therapeutic concentrations vary significantly. A range of 10-30 ug/mL  may be an effective concentration for many patients. However, some  are best treated at concentrations outside of this range. Acetaminophen concentrations >150 ug/mL at 4 hours after ingestion  and >50 ug/mL at 12 hours after ingestion are often associated with  toxic reactions. Performed at [facility], [place] [address], [place], [place]  [address]   cbc     Status: Abnormal   Collection Time: 04/06/19 10:47 PM  Result Value Ref Range   WBC 9.2 4.0 - 10.5 K/uL   RBC 5.75 4.22 - 5.81 MIL/uL   Hemoglobin 17.3 (H) 13.0 - 17.0 g/dL   HCT 50.3 39.0 - 52.0 %   MCV 87.5 80.0 - 100.0 fL   MCH 30.1 26.0 - 34.0 pg   MCHC 34.4 30.0 - 36.0 g/dL   RDW 14.4 11.5 - 15.5 %   Platelets 349 150 - 400 K/uL   nRBC 0.0 0.0 - 0.2 %    Comment: Performed at [facility], [place] [address] [place], [place] [address]  SARS Coronavirus 2 (CEPHEID - Performed in [place] hospital lab), Hosp Order     Status: None   Collection Time: 04/07/19 12:52 PM   Specimen: Nasopharyngeal Swab  Result Value Ref Range   SARS Coronavirus 2 NEGATIVE NEGATIVE    Comment: (NOTE) If result is NEGATIVE SARS-CoV-2 target nucleic acids are NOT DETECTED. The SARS-CoV-2 RNA is generally detectable in upper and lower  respiratory specimens during the acute phase of infection. The lowest  concentration of SARS-CoV-2 viral copies this assay can detect is 250  copies / mL. A negative result does not preclude SARS-CoV-2 infection  and should not be used as the sole basis for treatment or other  patient management decisions.  A negative result may occur with  improper specimen collection / handling, submission of specimen other  than nasopharyngeal swab, presence of viral mutation(s) within the  areas targeted by this assay, and inadequate number of viral copies  (<250 copies / mL). A negative result must be combined with clinical  observations, patient history, and epidemiological information.  If result is POSITIVE SARS-CoV-2 target nucleic acids are DETECTED. The SARS-CoV-2 RNA is generally detectable in upper and lower  respiratory specimens dur ing the acute phase of infection.  Positive  results are indicative of active infection with SARS-CoV-2.  Clinical  correlation with patient history and other diagnostic information is  necessary to  determine patient infection status.  Positive results do  not rule out bacterial infection or co-infection with other viruses. If result is PRESUMPTIVE POSTIVE SARS-CoV-2 nucleic acids MAY BE PRESENT.   A presumptive positive result was obtained on the submitted specimen  and confirmed on repeat testing.  While 2019 novel coronavirus  (SARS-CoV-2) nucleic acids may be present in the submitted sample  additional confirmatory testing may be necessary for epidemiological  and / or clinical management purposes  to differentiate between  SARS-CoV-2 and other Sarbecovirus currently known to infect humans.  If clinically indicated additional testing with an alternate test  methodology [phone]) is advised. The SARS-CoV-2 RNA is generally  detectable in upper and lower respiratory sp ecimens during the acute  phase of infection. The expected result is Negative. Fact Sheet for Patients:  BoilerBrush.com.cy Fact Sheet for Healthcare Providers: https://pope.com/ This test is not yet approved or cleared by the [place] FDA and has been authorized for detection and/or diagnosis of SARS-CoV-2 by FDA under an Emergency Use Authorization (EUA).  This EUA will remain in effect (meaning this test can be used) for the duration of the COVID-19 declaration under Section 564(b)(1) of the Act, 21 U.S.C. section 360bbb-3(b)(1), unless the authorization is terminated or revoked sooner. Performed at [facility], [address] [place], [place] [address]     Blood Alcohol level:  Lab Results  Component Value Date   ETH 346 [facility]) 04/06/2019   ETH 146 (H) 12/09/2018    Metabolic Disorder Labs:  Lab Results  Component Value Date   HGBA1C 5.8 (H) 03/23/2018   MPG 119.76 03/23/2018   MPG 114 11/04/2017   No results found for: PROLACTIN Lab Results  Component Value Date   CHOL 222 (H) 11/04/2017   TRIG 139 11/04/2017   HDL 41  11/04/2017   CHOLHDL 5.4 (H) 11/04/2017   VLDL 42 (H) 01/14/2017   LDLCALC 155 (H) 11/04/2017   LDLCALC 106 (H) 01/14/2017    Current Medications: Current Facility-Administered Medications  Medication Dose Route Frequency Provider Last Rate Last Dose  . acetaminophen (TYLENOL) tablet 650 mg  650 mg Oral Q6H PRN Antonieta Pert, MD      . alum & mag hydroxide-simeth (MAALOX/MYLANTA) 200-200-20 MG/5ML suspension 30 mL  30 mL Oral Q4H PRN Antonieta Pert, MD      . Melene Muller ON 04/08/2019] amLODipine (NORVASC) tablet 10 mg  10 mg Oral Daily Antonieta Pert, MD      . chlordiazePOXIDE (LIBRIUM) capsule 25 mg  25 mg Oral QID PRN Antonieta Pert, MD      . folic acid (FOLVITE) tablet 1 mg  1 mg Oral Daily Antonieta Pert, MD      . hydrALAZINE (APRESOLINE) tablet 10 mg  10 mg Oral Q6H Antonieta Pert, MD      . hydrOXYzine (ATARAX/VISTARIL) tablet 25 mg  25 mg Oral TID PRN Antonieta Pert, MD      . magnesium hydroxide (MILK OF MAGNESIA) suspension 30 mL  30 mL Oral Daily PRN Antonieta Pert, MD      . Melene Muller ON 04/08/2019] metoprolol succinate (TOPROL-XL) 24 hr  tablet 100 mg  100 mg Oral Daily Antonieta Pertlary, Greg Lawson, MD      . ondansetron [facility](ZOFRAN) tablet 4 mg  4 mg Oral Once PRN Antonieta Pertlary, Greg Lawson, MD      . pantoprazole (PROTONIX) EC tablet 40 mg  40 mg Oral Daily Antonieta Pertlary, Greg Lawson, MD      . Melene Muller[START ON 04/08/2019] thiamine (VITAMIN B-1) tablet 100 mg  100 mg Oral Daily Antonieta Pertlary, Greg Lawson, MD       Or  . Melene Muller[START ON 04/08/2019] thiamine (B-1) injection 100 mg  100 mg Intravenous Daily Antonieta Pertlary, Greg Lawson, MD      . traZODone (DESYREL) tablet 50 mg  50 mg Oral QHS PRN Antonieta Pertlary, Greg Lawson, MD      . venlafaxine XR (EFFEXOR-XR) 24 hr capsule 75 mg  75 mg Oral Q breakfast Antonieta Pertlary, Greg Lawson, MD       PTA Medications: Medications Prior to Admission  Medication Sig Dispense Refill Last Dose  . amLODipine (NORVASC) 10 MG tablet Take 1 tablet (10 mg total) by mouth daily. 90 tablet 1   .  aspirin EC 81 MG tablet Take 81 mg by mouth daily.     . busPIRone (BUSPAR) 7.5 MG tablet Take 1 tablet (7.5 mg total) by mouth at bedtime. (Patient not taking: Reported on 04/07/2019) 30 tablet 1   . escitalopram (LEXAPRO) 10 MG tablet 1 tab Effexor + 1/2 tab Lexapro once daily x7 days, then 1/2tab effexor + 1tab Lexapro once daily x7days, then STOP effexor, take 1 tablet Lexapro once daily (Patient taking differently: Take 10 mg by mouth daily. ) 30 tablet 1   . esomeprazole (NEXIUM) 40 MG capsule Take 1 capsule (40 mg total) by mouth daily. 90 capsule 0   . hydrALAZINE (APRESOLINE) 10 MG tablet Take 1 tablet (10 mg total) by mouth every 8 (eight) hours. (Patient not taking: Reported on 04/07/2019) 270 tablet 1   . metoprolol succinate (TOPROL-XL) 100 MG 24 hr tablet Take 1 tablet (100 mg total) by mouth daily. 90 tablet 1   . Multiple Vitamins-Minerals (MULTIVITAMIN MEN) TABS Take 1 tablet by mouth daily.       Musculoskeletal: Strength & Muscle Tone: within normal limits Gait & Station: normal Patient leans: N/A  Psychiatric Specialty Exam: Physical Exam  Nursing note and vitals reviewed. Constitutional: He is oriented to person, place, and time. He appears well-developed and well-nourished.  Cardiovascular: Normal rate.  Respiratory: Effort normal.  Neurological: He is alert and oriented to person, place, and time.    Review of Systems  Constitutional: Positive for malaise/fatigue. Negative for chills and fever.  Respiratory: Negative for cough and shortness of breath.   Cardiovascular: Negative for chest pain.  Gastrointestinal: Positive for nausea. Negative for diarrhea and vomiting.  Neurological: Positive for tremors. Negative for sensory change and headaches.  Psychiatric/Behavioral: Positive for substance abuse. Negative for depression, hallucinations and suicidal ideas. The patient is not nervous/anxious and does not have insomnia.     Blood pressure (!) 148/88, pulse (!) 105,  temperature 98.7 F (37.1 C), temperature source Oral, resp. rate 18, height 5\' 7"  (1.702 m), weight 79.4 kg, SpO2 98 %.Body mass index is 27.41 kg/m.  General Appearance: Casual  Eye Contact:  Good  Speech:  Clear and Coherent  Volume:  Normal  Mood:  Euthymic  Affect:  Congruent  Thought Process:  Coherent  Orientation:  Full (Time, Place, and Person)  Thought Content:  Logical  Suicidal Thoughts:  No  Homicidal Thoughts:  No  Memory:  Immediate;   Fair Recent;   Fair  Judgement:  Fair  Insight:  Lacking  Psychomotor Activity:  Normal  Concentration:  Concentration: Fair  Recall:  [employer]Fair  Fund of Knowledge:  Fair  Language:  Fair  Akathisia:  No  Handed:  Right  AIMS (if indicated):     Assets:  Communication Skills Housing Resilience  ADL's:  Intact  Cognition:  WNL  Sleep:       Treatment Plan Summary: Daily contact with patient to assess and evaluate symptoms and progress in treatment and Medication management  Inpatient hospitalization.  See MD's admission SRA for medication management.  Patient will participate in the therapeutic group milieu.  Discharge disposition in progress.   Observation Level/Precautions:  15 minute checks  Laboratory:  Reviewed  Psychotherapy:  Group therapy  Medications:  See MAR  Consultations:  PRN  Discharge Concerns:  Safety and stabilization  Estimated LOS: 3-5 days  Other:     Physician Treatment Plan for Primary Diagnosis: <principal problem not specified> Long Term Goal(s): Improvement in symptoms so as ready for discharge  Short Term Goals: Ability to identify changes in lifestyle to reduce recurrence of condition will improve, Ability to verbalize feelings will improve and Ability to disclose and discuss suicidal ideas  Physician Treatment Plan for Secondary Diagnosis: Active Problems:   Substance induced mood disorder (HCC)  Long Term Goal(s): Improvement in symptoms so as ready for discharge  Short Term Goals:  Ability to demonstrate self-control will improve and Ability to identify and develop effective coping behaviors will improve  I certify that inpatient services furnished can reasonably be expected to improve the patient's condition.    Aldean BakerJanet E Gaytha Raybourn, NP 7/3/20202:20 PM

## 2019-04-07 NOTE — Progress Notes (Signed)
D: Pt was in dayroom upon initial approach.  Pt presents with anxious, depressed affect and mood.  He brightens with interaction.  Pt reports his day was "good" and his goal is "going home."  His goal for tonight is "to sleep."  Pt reports he is feeling somewhat better since he has "been in here socializing with them."  He was referring to his peers.  Pt denies SI/HI, denies hallucinations, denies pain.  Pt has been visible in milieu interacting with peers and staff appropriately.     A: Introduced self to pt.  Met with pt 1:1.  Actively listened to pt and offered support and encouragement.  Medication administered per order.  PRN medication administered for sleep and anxiety.  15 minute safety checks in place.  R: Pt is safe on the unit.  Pt is compliant with medications.  Pt verbally contracts for safety.  Will continue to monitor and assess.

## 2019-04-07 NOTE — [facility] Suicide Risk Assessment (Signed)
[facility]   Nursing information obtained from:  Patient Demographic factors:  Male, Caucasian, Unemployed, Access to firearms, Low socioeconomic status Current Mental Status:  Suicidal ideation indicated by others Loss Factors:  Decrease in vocational status, Loss of significant relationship, Financial problems / change in socioeconomic status Historical Factors:  Anniversary of important loss, Impulsivity Risk Reduction Factors:  Sense of responsibility to family, Positive coping skills or problem solving skills, Living with another person, especially a relative  Total Time spent with patient: 30 minutes Principal Problem: <principal problem not specified> Diagnosis:  Active Problems:   Substance induced mood disorder (HCC)  Subjective Data: Patient is seen and examined.  Patient is a 48 year old male with a past psychiatric history significant for alcohol dependence who [facility] on 04/06/2019 under involuntary commitment.  The involuntary commitment paperwork was filed out by his family who stated "the patient is trying to drink himself to death".  The patient had apparently expressed a desire to die to join his truelove who is deceased (his deceased wife who passed away 8 years ago)..  Reportedly the patient has been drinking approximately 1/2 gallon of liquor a day.  His blood alcohol on admission was 346.  The patient minimized his symptoms of depression.  He stated that his wife had filed the involuntary commitment because she was tired of his drinking.  His last inpatient treatment for alcoholism was at [facility] several years ago.  Review of the electronic medical record revealed that he had been seen by his primary care provider on 03/22/2019.  At that time he was switched from venlafaxine to Lexapro.  He stated today that he had not switch medications and was still taking the venlafaxine.  The electronic  medical record also revealed an emergency room visit at [facility] on 12/09/2018.  He reported there for detox, but they were unable to find a bed for him within a certain period of time, and he left AMA.  He was admitted for evaluation and stabilization.  Continued Clinical Symptoms:    The "Alcohol Use Disorders Identification Test", Guidelines for Use in Primary Care, Second Edition.  World Science writer[employer] Organization [facility](WHO). Score between 0-7:  no or low risk or alcohol related problems. Score between 8-15:  moderate risk of alcohol related problems. Score between 16-19:  high risk of alcohol related problems. Score 20 or above:  warrants further diagnostic evaluation for alcohol dependence and treatment.   CLINICAL FACTORS:   Depression:   Anhedonia Comorbid alcohol abuse/dependence Hopelessness Impulsivity Insomnia Alcohol/Substance Abuse/Dependencies   Musculoskeletal: Strength & Muscle Tone: within normal limits Gait & Station: normal Patient leans: N/A  Psychiatric Specialty Exam: Physical Exam  Nursing note and vitals reviewed. Constitutional: He is oriented to person, place, and time. He appears well-developed and well-nourished.  HENT:  Head: Normocephalic and atraumatic.  Respiratory: Effort normal.  Neurological: He is alert and oriented to person, place, and time.    ROS  There were no vitals taken for this visit.There is no height or weight on file to calculate BMI.  General Appearance: Disheveled  Eye Contact:  Fair  Speech:  Normal Rate  Volume:  Normal  Mood:  Anxious  Affect:  Congruent  Thought Process:  Coherent and Descriptions of Associations: Circumstantial  Orientation:  Full (Time, Place, and Person)  Thought Content:  Logical  Suicidal Thoughts:  No  Homicidal Thoughts:  No  Memory:  Immediate;  Fair Recent;   Fair Remote;   Fair  Judgement:  Impaired  Insight:  Lacking  Psychomotor Activity:  Increased  Concentration:   Concentration: Fair and Attention Span: Fair  Recall:  [employer] of Knowledge:  Fair  Language:  Good  Akathisia:  Negative  Handed:  Right  AIMS (if indicated):     Assets:  Desire for Improvement Resilience  ADL's:  Intact  Cognition:  WNL  Sleep:         COGNITIVE FEATURES THAT CONTRIBUTE TO RISK:  None    SUICIDE RISK:   Mild:  Suicidal ideation of limited frequency, intensity, duration, and specificity.  There are no identifiable plans, no associated intent, mild dysphoria and related symptoms, good self-control (both objective and subjective assessment), few other risk factors, and identifiable protective factors, including available and accessible social support.  PLAN OF CARE: Patient is seen and examined.  Patient is a 48 year old male with a past psychiatric history significant for alcohol dependence and probable substance-induced mood disorder.  He will be admitted to the hospital.  He will be integrated into the milieu.  He will be encouraged to attend groups.  He will be placed on Librium 25 mg p.o. every 6 hours PRN a CIWA greater than 10.  He will also receive folic acid as well as thiamine.  We will continue the amlodipine and metoprolol as he is previously been prescribed.  He was also prescribed hydralazine 10 mg po TID as well and we will continue that. We will monitor his blood pressure.  Review of his laboratories revealed a mildly elevated glucose at 117, normal liver function enzymes.  His CBC was normal.  His drug screen was essentially negative, his blood alcohol on admission was 346.  He will be placed on detox observation as well as withdrawal precautions.  I certify that inpatient services furnished can reasonably be expected to improve the patient's condition.   Sharma Covert, MD 04/07/2019, 2:11 PM
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
## 2019-04-08 MED ORDER — METOPROLOL SUCCINATE ER 50 MG PO TB24
150.0000 mg | ORAL_TABLET | Freq: Every day | ORAL | Status: DC
  Administered 2019-04-09: 150 mg via ORAL
  Filled 2019-04-08 (×4): qty 1

## 2019-04-08 MED ORDER — METOPROLOL SUCCINATE ER 25 MG PO TB24
25.0000 mg | ORAL_TABLET | Freq: Once | ORAL | Status: AC
  Administered 2019-04-08: 18:00:00 25 mg via ORAL
  Filled 2019-04-08 (×2): qty 1

## 2019-04-08 NOTE — [facility] Group Notes (Signed)
LCSW Group Therapy Note  04/08/2019    10:00-11:00am   Type of Therapy and Topic:  Group Therapy: Early Messages Received About Anger  Participation Level:  Active   Description of Group:   In this group, patients shared and discussed the early messages received in their lives about anger through parental or other adult modeling, teaching, repression, punishment, violence, and more.  Participants identified how those childhood lessons influence even now how they usually or often react when angered.  The group discussed that anger is a secondary emotion and what may be the underlying emotional themes that come out through anger outbursts or that are ignored through anger suppression.  Therapeutic Goals: 1. Patients will identify one or more childhood message about anger that they received and how it was taught to them. 2. Patients will discuss how these childhood experiences have influenced and continue to influence their own expression or repression of anger even today. 3. Patients will explore possible primary emotions that tend to fuel their secondary emotion of anger. 4. Patients will learn that anger itself is normal and cannot be eliminated, and that healthier coping skills can assist with resolving conflict rather than worsening situations.  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  As a result, he learned to bottle up his anger until it explodes.  Therapeutic Modalities:   Cognitive Behavioral Therapy Motivation Interviewing  Maretta Los  .

## 2019-04-08 NOTE — [facility] Group Notes (Signed)
[place] Group Notes:  (Nursing)  Date:  04/08/2019  Time: 100 P Type of Therapy:  Nurse Education  Participation Level:  Active  Participation Quality:  Appropriate  Affect:  Appropriate  Cognitive:  Appropriate  Insight:  Appropriate  Engagement in Group:  Engaged  Modes of Intervention:  Education  Summary of Progress/Problems: Life Skills Group  Waymond Cera 04/08/2019, 4:16 PM

## 2019-04-08 NOTE — Progress Notes (Signed)
D. Pt is friendly upon approach- observed interacting well with peers and staff in the milieu. Per pt's self inventory, pt rated his depression, hopelessness and anxiety a 1/1/2, respectively. Pt writes that his goal today is "stay away from alcohol" and "not drink" Pt currently denies SI/HI and AVH A. Labs and vitals monitored. Pt compliant with medications. Pt supported emotionally and encouraged to express concerns and ask questions.   R. Pt remains safe with 15 minute checks. Will continue POC.

## 2019-04-08 NOTE — Progress Notes (Signed)
[place] NOVEL CORONAVIRUS (COVID-19) DAILY CHECK-OFF SYMPTOMS - answer yes or no to each - every day NO YES  Have you had a fever in the past 24 hours?  . Fever (Temp > 37.80C / 100F) X   Have you had any of these symptoms in the past 24 hours? . New Cough .  Sore Throat  .  Shortness of Breath .  Difficulty Breathing .  Unexplained Body Aches   X   Have you had any one of these symptoms in the past 24 hours not related to allergies?   . Runny Nose .  Nasal Congestion .  Sneezing   X   If you have had runny nose, nasal congestion, sneezing in the past 24 hours, has it worsened?  X   EXPOSURES - check yes or no X   Have you traveled outside the state in the past 14 days?  X   Have you been in contact with someone with a confirmed diagnosis of COVID-19 or PUI in the past 14 days without wearing appropriate PPE?  X   Have you been living in the same home as a person with confirmed diagnosis of COVID-19 or a PUI (household contact)?    X   Have you been diagnosed with COVID-19?    X              What to do next: Answered NO to all: Answered YES to anything:   Proceed with unit schedule Follow the [facility] Inpatient Flowsheet.   

## 2019-04-08 NOTE — Progress Notes (Signed)
D: Pt was in dayroom upon initial approach.  Pt presents with anxious affect and mood.  He reports he "had a bad phone call but I'm fine."  Goal is to "stay calm."  Pt states "we've spent a lot of time talking about our issues and it's been really helpful."  Pt denies SI/HI, denies hallucinations, denies pain.  Pt has been visible in milieu interacting with peers and staff appropriately.  Pt attended evening group.    A: Met with pt 1:1.  Actively listened to pt and offered support and encouragement. PRN medication administered for anxiety and sleep.  15 minute safety checks in place.  R: Pt is safe on the unit.  Pt is compliant with medications.  Pt verbally contracts for safety.  Will continue to monitor and assess.

## 2019-04-08 NOTE — Progress Notes (Signed)
[place] Group Notes:  (Nursing/MHT/Case Management/Adjunct)  Date:  04/08/2019  Time:  2030  Type of Therapy:  wrap up group  Participation Level:  Active  Participation Quality:  Appropriate, Attentive, Sharing and Supportive  Affect:  Appropriate and Irritable  Cognitive:  Appropriate  Insight:  Improving  Engagement in Group:  Engaged  Modes of Intervention:  Clarification, Education and Support  Summary of Progress/Problems: Pt shared that he really enjoyed nurse Patty's group today. Pt plans on quitting drinking and is grateful for the people he has met here.   [name] White S 04/08/2019, 10:00 PM

## 2019-04-08 NOTE — Progress Notes (Addendum)
[facility]  04/08/2019 10:48 AM Alcide EvenerHerbert L Raboin  MRN:  [id number]   Subjective:  [name] LemmingsHebert reported " I am feeling the best that I ever had.  I had a good nights rest lastnight."  Evaluation: Lorris observed resting in day room interacting with peers.  He presents pleasant, cooperative.  Denying suicidal or homicidal ideations.  Denies auditory or visual hallucinations.  Denies depression or depressive symptoms.  Reports he was initiated on Effexor reports taking and tolerating well.  Patient reports he is hopeful to discharge by Monday.  Reports plans to attend group sessions on today.  Reports a good appetite.  States he is resting well throughout the night.  Denies alcohol withdrawal symptoms and/or cravings.  EtOH on admission on 324, UDS - Support, encouragement and reassurance was provided.   Principal Problem: Substance induced mood disorder (HCC) Diagnosis: Principal Problem:   Substance induced mood disorder (HCC)  Total Time spent with patient: 15 minutes  Past Psychiatric History:   Past Medical History:  Past Medical History:  Diagnosis Date  . Alcohol abuse, in remission   . Anxiety   . Arthritis   . Avascular necrosis (HCC)   . Depression   . GERD (gastroesophageal reflux disease)   . History of alcohol abuse   . Hyperlipidemia   . Hypertension   . IFG (impaired fasting glucose)   . Overweight     Past Surgical History:  Procedure Laterality Date  . CARDIAC CATHETERIZATION N/A 01/02/2016   Procedure: Left Heart Cath and Coronary Angiography;  Surgeon: Marykay Lexavid W Harding, MD;  [facility];  Service: Cardiovascular;  Laterality: N/A;  . CARPAL TUNNEL RELEASE Bilateral    right x 2, left x 1  . DIALYSIS/PERMA CATHETER INSERTION N/A 03/23/2018   Procedure: DIALYSIS/PERMA CATHETER INSERTION;  Surgeon: Renford DillsSchnier, Gregory G, MD;  Location: [facility] INVASIVE CV LAB;  Service: Cardiovascular;  Laterality: N/A;  . TESTICLE TORSION REDUCTION    . TOTAL HIP  ARTHROPLASTY Left 01/30/2014   Procedure: LEFT TOTAL HIP ARTHROPLASTY ANTERIOR APPROACH;  Surgeon: Shelda PalMatthew D Olin, MD;  Location: [facility] ORS;  Service: Orthopedics;  Laterality: Left;  Marland Kitchen. VASECTOMY  1995  . VASECTOMY REVERSAL  1997   Family History:  Family History  Problem Relation Age of Onset  . Hypertension Mother   . Diabetes Mother   . Hypertension Father   . Stroke Father   . Cancer Neg Hx   . COPD Neg Hx   . Heart disease Neg Hx    Family Psychiatric  History:  Social History:  Social History   Substance and Sexual Activity  Alcohol Use Yes  . Frequency: Never   Comment: 1/5 gallon of liquor daily     Social History   Substance and Sexual Activity  Drug Use No    Social History   Socioeconomic History  . Marital status: Married    Spouse name: Not on file  . Number of children: 5  . Years of education: 3012  . Highest education level: Not on file  Occupational History  . Occupation: lawn care    Comment: [employer]  Social Needs  . Financial resource strain: Not on file  . Food insecurity    Worry: Not on file    Inability: Not on file  . Transportation needs    Medical: Not on file    Non-medical: Not on file  Tobacco Use  . Smoking status: Current Every Day Smoker  Packs/day: 1.00    Years: 30.00    Pack years: 30.00    Types: Cigarettes  . Smokeless tobacco: Never Used  Substance and Sexual Activity  . Alcohol use: Yes    Frequency: Never    Comment: 1/5 gallon of liquor daily  . Drug use: No  . Sexual activity: Not Currently    Birth control/protection: Surgical  Lifestyle  . Physical activity    Days per week: Not on file    Minutes per session: Not on file  . Stress: Not on file  Relationships  . Social Musicianconnections    Talks on phone: Not on file    Gets together: Not on file    Attends religious service: Not on file    Active member of club or organization: Not on file    Attends meetings of clubs or organizations: Not on  file    Relationship status: Not on file  Other Topics Concern  . Not on file  Social History Narrative   epworth sleepiness scale = 6 (12/31/15)   Additional Social History:                         Sleep: Good  Appetite:  Good  Current Medications: Current Facility-Administered Medications  Medication Dose Route Frequency Provider Last Rate Last Dose  . acetaminophen (TYLENOL) tablet 650 mg  650 mg Oral Q6H PRN Antonieta Pertlary, Greg Lawson, MD      . alum & mag hydroxide-simeth (MAALOX/MYLANTA) 200-200-20 MG/5ML suspension 30 mL  30 mL Oral Q4H PRN Antonieta Pertlary, Greg Lawson, MD      . amLODipine (NORVASC) tablet 10 mg  10 mg Oral Daily Antonieta Pertlary, Greg Lawson, MD   10 mg at 04/08/19 0841  . chlordiazePOXIDE (LIBRIUM) capsule 25 mg  25 mg Oral QID PRN Antonieta Pertlary, Greg Lawson, MD   25 mg at 04/07/19 1517  . folic acid (FOLVITE) tablet 1 mg  1 mg Oral Daily Antonieta Pertlary, Greg Lawson, MD   1 mg at 04/08/19 0840  . hydrALAZINE (APRESOLINE) tablet 10 mg  10 mg Oral Q6H Antonieta Pertlary, Greg Lawson, MD   10 mg at 04/08/19 16100634  . hydrOXYzine (ATARAX/VISTARIL) tablet 25 mg  25 mg Oral TID PRN Antonieta Pertlary, Greg Lawson, MD   25 mg at 04/07/19 2113  . magnesium hydroxide (MILK OF MAGNESIA) suspension 30 mL  30 mL Oral Daily PRN Antonieta Pertlary, Greg Lawson, MD      . metoprolol succinate (TOPROL-XL) 24 hr tablet 100 mg  100 mg Oral Daily Antonieta Pertlary, Greg Lawson, MD   100 mg at 04/08/19 0840  . nicotine (NICODERM CQ - dosed in mg/24 hours) patch 21 mg  21 mg Transdermal Daily Antonieta Pertlary, Greg Lawson, MD   21 mg at 04/08/19 0843  . ondansetron (ZOFRAN) tablet 4 mg  4 mg Oral Once PRN Antonieta Pertlary, Greg Lawson, MD      . pantoprazole (PROTONIX) EC tablet 40 mg  40 mg Oral Daily Antonieta Pertlary, Greg Lawson, MD   40 mg at 04/08/19 0841  . thiamine (VITAMIN B-1) tablet 100 mg  100 mg Oral Daily Antonieta Pertlary, Greg Lawson, MD   100 mg at 04/08/19 [id number]   Or  . thiamine (B-1) injection 100 mg  100 mg Intravenous Daily Antonieta Pertlary, Greg Lawson, MD      . traZODone (DESYREL) tablet 50 mg  50 mg  Oral QHS PRN Antonieta Pertlary, Greg Lawson, MD   50 mg at 04/07/19 2113  . venlafaxine XR (EFFEXOR-XR) 24 hr  capsule 75 mg  75 mg Oral Q breakfast Antonieta Pertlary, Greg Lawson, MD   75 mg at 04/08/19 [id number]    Lab Results:  Results for orders placed or performed during the hospital encounter of 04/06/19 (from the past 48 hour(s))  Comprehensive metabolic panel     Status: Abnormal   Collection Time: 04/06/19 10:47 PM  Result Value Ref Range   Sodium 142 135 - 145 mmol/L   Potassium 3.5 3.5 - 5.1 mmol/L   Chloride 105 98 - 111 mmol/L   CO2 22 22 - 32 mmol/L   Glucose, Bld 117 (H) 70 - 99 mg/dL   BUN 15 6 - 20 mg/dL   Creatinine, Ser 9.600.79 0.61 - 1.24 mg/dL   Calcium 9.0 8.9 - 45.410.3 mg/dL   Total Protein 8.5 (H) 6.5 - 8.1 g/dL   Albumin 4.8 3.5 - 5.0 g/dL   AST 29 15 - 41 U/L   ALT 32 0 - 44 U/L   Alkaline Phosphatase 96 38 - 126 U/L   Total Bilirubin 0.4 0.3 - 1.2 mg/dL   GFR calc non Af Amer >60 >60 mL/min   GFR calc Af Amer >60 >60 mL/min   Anion gap 15 5 - 15    Comment: Performed at [facility], [address] [place], [place] [address]  Ethanol     Status: Abnormal   Collection Time: 04/06/19 10:47 PM  Result Value Ref Range   Alcohol, Ethyl (B) 346 (HH) <10 mg/dL    Comment: CRITICAL RESULT CALLED TO, READ BACK BY AND VERIFIED WITH: RN J FRICKIE AT 2343 04/06/19 CRUICKSHANKA (NOTE) Lowest detectable limit for serum alcohol is 10 mg/dL. For medical purposes only. Performed at [facility], [address] [place], [place] [address]   Salicylate level     Status: None   Collection Time: 04/06/19 10:47 PM  Result Value Ref Range   Salicylate Lvl <7.0 2.8 - 30.0 mg/dL    Comment: Performed at [facility], [address] [place], [place] [address]  Acetaminophen level     Status: Abnormal   Collection Time: 04/06/19 10:47 PM  Result Value Ref Range   Acetaminophen (Tylenol), Serum <10 (L) 10 - 30 ug/mL    Comment: (NOTE) Therapeutic  concentrations vary significantly. A range of 10-30 ug/mL  may be an effective concentration for many patients. However, some  are best treated at concentrations outside of this range. Acetaminophen concentrations >150 ug/mL at 4 hours after ingestion  and >50 ug/mL at 12 hours after ingestion are often associated with  toxic reactions. Performed at [facility], [address] [place], [place] [address]   cbc     Status: Abnormal   Collection Time: 04/06/19 10:47 PM  Result Value Ref Range   WBC 9.2 4.0 - 10.5 K/uL   RBC 5.75 4.22 - 5.81 MIL/uL   Hemoglobin 17.3 (H) 13.0 - 17.0 g/dL   HCT 57.850.3 46.939.0 - 62.952.0 %   MCV 87.5 80.0 - 100.0 fL   MCH 30.1 26.0 - 34.0 pg   MCHC 34.4 30.0 - 36.0 g/dL   RDW 52.814.4 41.311.5 - 24.415.5 %   Platelets 349 150 - 400 K/uL   nRBC 0.0 0.0 - 0.2 %    Comment: Performed at [facility], [address] [place], [place] [address]  SARS Coronavirus 2 (CEPHEID - Performed in [facility] Order     Status: None  Collection Time: 04/07/19 12:52 PM   Specimen: Nasopharyngeal Swab  Result Value Ref Range   SARS Coronavirus 2 NEGATIVE NEGATIVE    Comment: (NOTE) If result is NEGATIVE SARS-CoV-2 target nucleic acids are NOT DETECTED. The SARS-CoV-2 RNA is generally detectable in upper and lower  respiratory specimens during the acute phase of infection. The lowest  concentration of SARS-CoV-2 viral copies this assay can detect is 250  copies / mL. A negative result does not preclude SARS-CoV-2 infection  and should not be used as the sole basis for treatment or other  patient management decisions.  A negative result may occur with  improper specimen collection / handling, submission of specimen other  than nasopharyngeal swab, presence of viral mutation(s) within the  areas targeted by this assay, and inadequate number of viral copies  (<250 copies / mL). A negative result must be combined with clinical   observations, patient history, and epidemiological information. If result is POSITIVE SARS-CoV-2 target nucleic acids are DETECTED. The SARS-CoV-2 RNA is generally detectable in upper and lower  respiratory specimens dur ing the acute phase of infection.  Positive  results are indicative of active infection with SARS-CoV-2.  Clinical  correlation with patient history and other diagnostic information is  necessary to determine patient infection status.  Positive results do  not rule out bacterial infection or co-infection with other viruses. If result is PRESUMPTIVE POSTIVE SARS-CoV-2 nucleic acids MAY BE PRESENT.   A presumptive positive result was obtained on the submitted specimen  and confirmed on repeat testing.  While 2019 novel coronavirus  (SARS-CoV-2) nucleic acids may be present in the submitted sample  additional confirmatory testing may be necessary for epidemiological  and / or clinical management purposes  to differentiate between  SARS-CoV-2 and other Sarbecovirus currently known to infect humans.  If clinically indicated additional testing with an alternate test  methodology [phone]) is advised. The SARS-CoV-2 RNA is generally  detectable in upper and lower respiratory sp ecimens during the acute  phase of infection. The expected result is Negative. Fact Sheet for Patients:  StrictlyIdeas.no Fact Sheet for Healthcare Providers: BankingDealers.co.za This test is not yet approved or cleared by the [place] FDA and has been authorized for detection and/or diagnosis of SARS-CoV-2 by FDA under an Emergency Use Authorization (EUA).  This EUA will remain in effect (meaning this test can be used) for the duration of the COVID-19 declaration under Section 564(b)(1) of the Act, 21 U.S.C. section 360bbb-3(b)(1), unless the authorization is terminated or revoked sooner. Performed at [facility], [place]  [address] [place], [place] [address]     Blood Alcohol level:  Lab Results  Component Value Date   ETH 346 (HH) 04/06/2019   ETH 146 (H) 25/95/6387    Metabolic Disorder Labs: Lab Results  Component Value Date   HGBA1C 5.8 (H) 03/23/2018   MPG 119.76 03/23/2018   MPG 114 11/04/2017   No results found for: PROLACTIN Lab Results  Component Value Date   CHOL 222 (H) 11/04/2017   TRIG 139 11/04/2017   HDL 41 11/04/2017   CHOLHDL 5.4 (H) 11/04/2017   VLDL 42 (H) 01/14/2017   LDLCALC 155 (H) 11/04/2017   LDLCALC 106 (H) 01/14/2017    Physical Findings: AIMS: Facial and Oral Movements Muscles of Facial Expression: None, normal Lips and Perioral Area: None, normal Jaw: None, normal Tongue: None, normal,Extremity Movements Upper (arms, wrists, hands, fingers): None, normal Lower (legs, knees, ankles, toes): None, normal, Trunk Movements Neck,  shoulders, hips: None, normal, Overall Severity Severity of abnormal movements (highest score from questions above): None, normal Incapacitation due to abnormal movements: None, normal Patient's awareness of abnormal movements (rate only patient's report): No Awareness, Dental Status Current problems with teeth and/or dentures?: No Does patient usually wear dentures?: No  CIWA:  CIWA-Ar Total: 3 COWS:     Musculoskeletal: Strength & Muscle Tone: within normal limits Gait & Station: normal Patient leans: N/A  Psychiatric Specialty Exam: Physical Exam  ROS  Blood pressure (!) 139/99, pulse (!) 104, temperature 97.8 F (36.6 C), temperature source Oral, resp. rate 18, height  (1.702 m), weight 79.4 kg, SpO2 98 %.Body mass index is 27.41 kg/m.  General Appearance: Casual  Eye Contact:  Fair  Speech:  Clear and Coherent  Volume:  Normal  Mood:  Anxious  Affect:  Congruent  Thought Process:  Coherent  Orientation:  Full (Time, Place, and Person)  Thought Content:  Logical  Suicidal Thoughts:  No  Homicidal Thoughts:  No   Memory:  Recent;   Fair Remote;   Fair  Judgement:  Fair  Insight:  Fair  Psychomotor Activity:  Normal  Concentration:  Concentration: Fair  Recall:  [employer] of Knowledge:  Fair  Language:  Fair  Akathisia:  No  Handed:  Right  AIMS (if indicated):     Assets:  Communication Skills Desire for Improvement Resilience Social Support  ADL's:  Intact  Cognition:  WNL  Sleep:  Number of Hours: 6.75     Treatment Plan Summary: Daily contact with patient to assess and evaluate symptoms and progress in treatment and Medication management   Continue with current treatment plan on 04/08/2019 as listed below except were noted  Substance-induced mood disorder:  Continue Effexor 75 mg p.o. daily Continue Vistaril 25 mg p.o. 3 times daily Continue trazodone 50 mg p.o. nightly Continue Librium 25 mg p.o. PRN CIWA withdrawal symptoms  CSW to continue working on discharge disposition Patient encouraged to participate within the therapeutic milieu  Oneta Rack, NP 04/08/2019, 10:48 AM

## 2019-04-09 MED ORDER — NICOTINE POLACRILEX 2 MG MT GUM: 2.0000 mg | CHEWING_GUM | OROMUCOSAL | Status: DC | PRN

## 2019-04-09 NOTE — Progress Notes (Signed)
[facility]  04/09/2019 10:26 AM [name] EvenerHerbert L White  MRN:  [id number]   Subjective: [name] LemmingsHebert reported "  I continue to improve every day, but I am ready to leave."   Evaluation:[name] "[name] White" reports feeling better overall since his admission.  Reported in hindsight I think this was the best place for me at this time.  Patient is requesting to be discharged tomorrow.  Denies depression or depressive symptoms.  Denies suicidal or homicidal ideations.  Denies auditory or  visual hallucinations.  Reports taking and tolerating medications well.  Reported discontinuation of her hypertension medications that cause diarrhea and now is feeling much better.  Attending daily group session with active and engaged participation.  Reports [name] White would like to attempt to work on the relationship between [name] White and his stepchildren. [name] White reported a good appetite.  States [name] White is resting well throughout the night.  Support encouragement reassurance was provided.  Principal Problem: Substance induced mood disorder (HCC) Diagnosis: Principal Problem:   Substance induced mood disorder (HCC)  Total Time spent with patient: 15 minutes  Past Psychiatric History:   Past Medical History:  Past Medical History:  Diagnosis Date  . Alcohol abuse, in remission   . Anxiety   . Arthritis   . Avascular necrosis (HCC)   . Depression   . GERD (gastroesophageal reflux disease)   . History of alcohol abuse   . Hyperlipidemia   . Hypertension   . IFG (impaired fasting glucose)   . Overweight     Past Surgical History:  Procedure Laterality Date  . CARDIAC CATHETERIZATION N/A 01/02/2016   Procedure: Left Heart Cath and Coronary Angiography;  Surgeon: Marykay Lexavid W Harding, MD;  [facility];  Service: Cardiovascular;  Laterality: N/A;  . CARPAL TUNNEL RELEASE Bilateral    right x 2, left x 1  . DIALYSIS/PERMA CATHETER INSERTION N/A 03/23/2018   Procedure: DIALYSIS/PERMA CATHETER INSERTION;  Surgeon:  Renford DillsSchnier, Gregory G, MD;  Location: [facility] INVASIVE CV LAB;  Service: Cardiovascular;  Laterality: N/A;  . TESTICLE TORSION REDUCTION    . TOTAL HIP ARTHROPLASTY Left 01/30/2014   Procedure: LEFT TOTAL HIP ARTHROPLASTY ANTERIOR APPROACH;  Surgeon: Shelda PalMatthew D Olin, MD;  Location: [facility] ORS;  Service: Orthopedics;  Laterality: Left;  Marland Kitchen. VASECTOMY  1995  . VASECTOMY REVERSAL  1997   Family History:  Family History  Problem Relation Age of Onset  . Hypertension Mother   . Diabetes Mother   . Hypertension Father   . Stroke Father   . Cancer Neg Hx   . COPD Neg Hx   . Heart disease Neg Hx    Family Psychiatric  History:  Social History:  Social History   Substance and Sexual Activity  Alcohol Use Yes  . Frequency: Never   Comment: 1/5 gallon of liquor daily     Social History   Substance and Sexual Activity  Drug Use No    Social History   Socioeconomic History  . Marital status: Married    Spouse name: Not on file  . Number of children: 5  . Years of education: 5312  . Highest education level: Not on file  Occupational History  . Occupation: lawn care    Comment: [employer]  Social Needs  . Financial resource strain: Not on file  . Food insecurity    Worry: Not on file    Inability: Not on file  . Transportation needs    Medical: Not on file  Non-medical: Not on file  Tobacco Use  . Smoking status: Current Every Day Smoker    Packs/day: 1.00    Years: 30.00    Pack years: 30.00    Types: Cigarettes  . Smokeless tobacco: Never Used  Substance and Sexual Activity  . Alcohol use: Yes    Frequency: Never    Comment: 1/5 gallon of liquor daily  . Drug use: No  . Sexual activity: Not Currently    Birth control/protection: Surgical  Lifestyle  . Physical activity    Days per week: Not on file    Minutes per session: Not on file  . Stress: Not on file  Relationships  . Social Musicianconnections    Talks on phone: Not on file    Gets together: Not on file     Attends religious service: Not on file    Active member of club or organization: Not on file    Attends meetings of clubs or organizations: Not on file    Relationship status: Not on file  Other Topics Concern  . Not on file  Social History Narrative   epworth sleepiness scale = 6 (12/31/15)   Additional Social History:                         Sleep: Good  Appetite:  Good  Current Medications: Current Facility-Administered Medications  Medication Dose Route Frequency Provider Last Rate Last Dose  . acetaminophen (TYLENOL) tablet 650 mg  650 mg Oral Q6H PRN Antonieta Pertlary, Greg Lawson, MD      . alum & mag hydroxide-simeth (MAALOX/MYLANTA) 200-200-20 MG/5ML suspension 30 mL  30 mL Oral Q4H PRN Antonieta Pertlary, Greg Lawson, MD      . amLODipine (NORVASC) tablet 10 mg  10 mg Oral Daily Antonieta Pertlary, Greg Lawson, MD   10 mg at 04/09/19 0815  . chlordiazePOXIDE (LIBRIUM) capsule 25 mg  25 mg Oral QID PRN Antonieta Pertlary, Greg Lawson, MD   25 mg at 04/07/19 1517  . folic acid (FOLVITE) tablet 1 mg  1 mg Oral Daily Antonieta Pertlary, Greg Lawson, MD   1 mg at 04/09/19 0815  . hydrOXYzine (ATARAX/VISTARIL) tablet 25 mg  25 mg Oral TID PRN Antonieta Pertlary, Greg Lawson, MD   25 mg at 04/08/19 2116  . magnesium hydroxide (MILK OF MAGNESIA) suspension 30 mL  30 mL Oral Daily PRN Antonieta Pertlary, Greg Lawson, MD      . metoprolol succinate (TOPROL-XL) 24 hr tablet 150 mg  150 mg Oral Daily Antonieta Pertlary, Greg Lawson, MD   150 mg at 04/09/19 0815  . nicotine (NICODERM CQ - dosed in mg/24 hours) patch 21 mg  21 mg Transdermal Daily Antonieta Pertlary, Greg Lawson, MD   21 mg at 04/09/19 0817  . ondansetron (ZOFRAN) tablet 4 mg  4 mg Oral Once PRN Antonieta Pertlary, Greg Lawson, MD      . pantoprazole (PROTONIX) EC tablet 40 mg  40 mg Oral Daily Antonieta Pertlary, Greg Lawson, MD   40 mg at 04/09/19 0815  . thiamine (VITAMIN B-1) tablet 100 mg  100 mg Oral Daily Antonieta Pertlary, Greg Lawson, MD   100 mg at 04/09/19 0815   Or  . thiamine (B-1) injection 100 mg  100 mg Intravenous Daily Antonieta Pertlary, Greg Lawson, MD       . traZODone (DESYREL) tablet 50 mg  50 mg Oral QHS PRN Antonieta Pertlary, Greg Lawson, MD   50 mg at 04/08/19 2116  . venlafaxine XR (EFFEXOR-XR) 24 hr capsule 75 mg  75  mg Oral Q breakfast Sharma Covert, MD   75 mg at 04/09/19 2423    Lab Results:  Results for orders placed or performed during the hospital encounter of 04/06/19 (from the past 48 hour(s))  SARS Coronavirus 2 (CEPHEID - Performed in [facility] hospital lab), Hosp Order     Status: None   Collection Time: 04/07/19 12:52 PM   Specimen: Nasopharyngeal Swab  Result Value Ref Range   SARS Coronavirus 2 NEGATIVE NEGATIVE    Comment: (NOTE) If result is NEGATIVE SARS-CoV-2 target nucleic acids are NOT DETECTED. The SARS-CoV-2 RNA is generally detectable in upper and lower  respiratory specimens during the acute phase of infection. The lowest  concentration of SARS-CoV-2 viral copies this assay can detect is 250  copies / mL. A negative result does not preclude SARS-CoV-2 infection  and should not be used as the sole basis for treatment or other  patient management decisions.  A negative result may occur with  improper specimen collection / handling, submission of specimen other  than nasopharyngeal swab, presence of viral mutation(s) within the  areas targeted by this assay, and inadequate number of viral copies  (<250 copies / mL). A negative result must be combined with clinical  observations, patient history, and epidemiological information. If result is POSITIVE SARS-CoV-2 target nucleic acids are DETECTED. The SARS-CoV-2 RNA is generally detectable in upper and lower  respiratory specimens dur ing the acute phase of infection.  Positive  results are indicative of active infection with SARS-CoV-2.  Clinical  correlation with patient history and other diagnostic information is  necessary to determine patient infection status.  Positive results do  not rule out bacterial infection or co-infection with other viruses. If result  is PRESUMPTIVE POSTIVE SARS-CoV-2 nucleic acids MAY BE PRESENT.   A presumptive positive result was obtained on the submitted specimen  and confirmed on repeat testing.  While 2019 novel coronavirus  (SARS-CoV-2) nucleic acids may be present in the submitted sample  additional confirmatory testing may be necessary for epidemiological  and / or clinical management purposes  to differentiate between  SARS-CoV-2 and other Sarbecovirus currently known to infect humans.  If clinically indicated additional testing with an alternate test  methodology [phone]) is advised. The SARS-CoV-2 RNA is generally  detectable in upper and lower respiratory sp ecimens during the acute  phase of infection. The expected result is Negative. Fact Sheet for Patients:  StrictlyIdeas.no Fact Sheet for Healthcare Providers: BankingDealers.co.za This test is not yet approved or cleared by the [place] FDA and has been authorized for detection and/or diagnosis of SARS-CoV-2 by FDA under an Emergency Use Authorization (EUA).  This EUA will remain in effect (meaning this test can be used) for the duration of the COVID-19 declaration under Section 564(b)(1) of the Act, 21 U.S.C. section 360bbb-3(b)(1), unless the authorization is terminated or revoked sooner. Performed at [facility], [place] [address], [place], [place] [address]     Blood Alcohol level:  Lab Results  Component Value Date   ETH [facility]) 04/06/2019   ETH 146 (H) 86/76/1952    Metabolic Disorder Labs: Lab Results  Component Value Date   HGBA1C 5.8 (H) 03/23/2018   MPG 119.76 03/23/2018   MPG 114 11/04/2017   No results found for: PROLACTIN Lab Results  Component Value Date   CHOL 222 (H) 11/04/2017   TRIG 139 11/04/2017   HDL 41 11/04/2017   CHOLHDL 5.4 (H) 11/04/2017   VLDL 42 (H)  01/14/2017   LDLCALC 155 (H) 11/04/2017   LDLCALC 106 (H) 01/14/2017    Physical  Findings: AIMS: Facial and Oral Movements Muscles of Facial Expression: None, normal Lips and Perioral Area: None, normal Jaw: None, normal Tongue: None, normal,Extremity Movements Upper (arms, wrists, hands, fingers): None, normal Lower (legs, knees, ankles, toes): None, normal, Trunk Movements Neck, shoulders, hips: None, normal, Overall Severity Severity of abnormal movements (highest score from questions above): None, normal Incapacitation due to abnormal movements: None, normal Patient's awareness of abnormal movements (rate only patient's report): No Awareness, Dental Status Current problems with teeth and/or dentures?: No Does patient usually wear dentures?: No  CIWA:  CIWA-Ar Total: 1 COWS:     Musculoskeletal: Strength & Muscle Tone: within normal limits Gait & Station: normal Patient leans: N/A  Psychiatric Specialty Exam: Physical Exam  GI: There is abdominal tenderness.    ROS  Blood pressure (!) 140/102, pulse 100, temperature 98.1 F (36.7 C), temperature source Oral, resp. rate 20, height 5\' 7"  (1.702 m), weight 79.4 kg, SpO2 99 %.Body mass index is 27.41 kg/m.  General Appearance: Casual  Eye Contact:  Fair  Speech:  Clear and Coherent  Volume:  Normal  Mood:  Anxious  Affect:  Congruent  Thought Process:  Coherent  Orientation:  Full (Time, Place, and Person)  Thought Content:  Logical  Suicidal Thoughts:  No  Homicidal Thoughts:  No  Memory:  Recent;   Fair Remote;   Fair  Judgement:  Fair  Insight:  Fair  Psychomotor Activity:  Normal  Concentration:  Concentration: Fair  Recall:  FiservFair  Fund of Knowledge:  Fair  Language:  Fair  Akathisia:  No  Handed:  Right  AIMS (if indicated):     Assets:  Communication Skills Desire for Improvement Resilience Social Support  ADL's:  Intact  Cognition:  WNL  Sleep:  Number of Hours: 5.25     Treatment Plan Summary: Daily contact with patient to assess and evaluate symptoms and progress in  treatment and Medication management   Continue with current treatment plan on 04/09/2019 as listed below except were noted  Substance-induced mood disorder:  Continue Effexor 75 mg p.o. daily Continue Vistaril 25 mg p.o. 3 times daily Continue trazodone 50 mg p.o. nightly Continue Librium 25 mg p.o. PRN CIWA withdrawal symptoms  CSW to continue working on discharge disposition Patient encouraged to participate within the therapeutic milieu  Oneta Rackanika N Lewis, NP 04/09/2019, 10:26 AM

## 2019-04-09 NOTE — Progress Notes (Signed)
D. Pt presents with an anxious affect/ mood- calm cooperative behavior- appropriate during interactions- visible in the milieu interacting well with peers and staff. Per pt's self inventory, pt rated his depression hopelessness and anxiety all 0's. Pt writes that his most important goal today is "not drinking" .  Pt currently denies SI/HI and AVH  A. Labs and vitals monitored. Pt compliant with medications. Pt supported emotionally and encouraged to express concerns and ask questions.   R. Pt remains safe with 15 minute checks. Will continue POC.

## 2019-04-09 NOTE — [facility] Group Notes (Signed)
[facility] LCSW Group Therapy Note  04/09/2019   10:00-11:00AM  Type of Therapy and Topic:  Group Therapy:  Unhealthy versus Healthy Supports, Which Am I?  Participation Level:  Active   Description of Group:  Patients in this group were introduced to the concept that additional supports including self-support are an essential part of recovery.  Initially a discussion was held about the differences between healthy versus unhealthy supports.  Patients were asked to share what healthy supports are helping them to achieve their goals and what unhealthy supports in their lives need to be addressed.   A song entitled "My Own Hero" was played and a group discussion ensued in which patients stated they could relate to the song and it inspired them to realize they have be willing to help themselves in order to succeed, because other people cannot achieve sobriety or stability for them.  We discussed adding a variety of healthy supports to address the various needs in patient lives, including becoming more self-supportive.  A song was then played called "I Am Enough" and people stated they related to this when it talked about being your own worst enemy.  A song was played called "I Know Where I've Been" toward the end of group and used to conduct an inspirational wrap-up to group of remembering how far they have already come in their journey.  Therapeutic Goals: 1)  Highlight the differences between healthy and unhealthy supports 2)  Suggest the importance of being a part of one's own support system  3)  Identify the patient's current support system   4) Elicit commitments to add healthy supports and to become more conscious of being self-supportive   Summary of Patient Progress:  The patient listed as healthy supports the following:  His aunt and uncle.  The patient expressed that unhealthy supports to be addressed include his stepdaughter.  Patient feels he is a healthy support for himself.  Healthy supports which  could be added for increased stability and happiness include professionals.  Therapeutic Modalities:   Motivational Interviewing Activity  Maretta Los , MSW, LCSW  o

## 2019-04-09 NOTE — Progress Notes (Signed)
[facility] NOVEL CORONAVIRUS (COVID-19) DAILY CHECK-OFF SYMPTOMS - answer yes or no to each - every day NO YES  Have you had a fever in the past 24 hours?  . Fever (Temp > 37.80C / 100F) X   Have you had any of these symptoms in the past 24 hours? . New Cough .  Sore Throat  .  Shortness of Breath .  Difficulty Breathing .  Unexplained Body Aches   X   Have you had any one of these symptoms in the past 24 hours not related to allergies?   . Runny Nose .  Nasal Congestion .  Sneezing   X   If you have had runny nose, nasal congestion, sneezing in the past 24 hours, has it worsened?  X   EXPOSURES - check yes or no X   Have you traveled outside the state in the past 14 days?  X   Have you been in contact with someone with a confirmed diagnosis of COVID-19 or PUI in the past 14 days without wearing appropriate PPE?  X   Have you been living in the same home as a person with confirmed diagnosis of COVID-19 or a PUI (household contact)?    X   Have you been diagnosed with COVID-19?    X              What to do next: Answered NO to all: Answered YES to anything:   Proceed with unit schedule Follow the [facility] Inpatient Flowsheet.   

## 2019-04-09 NOTE — [facility] Group Notes (Signed)
[facility] Group Notes: Nursing Education Group  Date:  04/09/2019  Time:  1:15 PM  Facilitator: Patty D., RN  Type of Therapy:  Nurse Education  Participation Level:  Active  Participation Quality:  Appropriate  Affect:  Appropriate  Cognitive:  Alert and Oriented  Insight:  Appropriate  Engagement in Group:  Developing/Improving  Modes of Intervention:  Activity, Discussion, Socialization and Support  Summary of Progress/Problems: Patient attended group and was not disruptive in the discussion.   [name] White 04/09/2019, 1:15 PM 

## 2019-04-09 NOTE — Progress Notes (Signed)
D: Pt was in dayroom upon initial approach.  Pt presents with anxious affect and mood.  He reports his day has been "good" and his "leaving tomorrow."  Pt reports feeling safe to discharge.  Pt denies SI/HI, denies hallucinations, denies pain.  Pt has been visible in milieu interacting with peers and staff appropriately.   A: Met with pt 1:1.  Actively listened to pt and offered support and encouragement. PRN medication administered for anxiety and sleep.  15 minute safety checks in place.  R: Pt is safe on the unit.  Pt is compliant with medications.  Pt verbally contracts for safety.  Will continue to monitor and assess.
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
## 2019-04-10 MED ORDER — METOPROLOL SUCCINATE ER 200 MG PO TB24: 200.0000 mg | ORAL_TABLET | Freq: Every day | ORAL | 0 refills | Status: DC

## 2019-04-10 MED ORDER — AMLODIPINE BESYLATE 10 MG PO TABS: 10.0000 mg | ORAL_TABLET | Freq: Every day | ORAL | 0 refills | Status: DC

## 2019-04-10 MED ORDER — METOPROLOL SUCCINATE ER 50 MG PO TB24
200.0000 mg | ORAL_TABLET | Freq: Every day | ORAL | Status: DC
  Administered 2019-04-10: 200 mg via ORAL
  Filled 2019-04-10: qty 1
  Filled 2019-04-10: qty 28
  Filled 2019-04-10: qty 1

## 2019-04-10 MED ORDER — NICOTINE POLACRILEX 2 MG MT GUM: 2.0000 mg | CHEWING_GUM | OROMUCOSAL | 0 refills | Status: DC | PRN

## 2019-04-10 MED ORDER — VENLAFAXINE HCL ER 75 MG PO CP24: 75.0000 mg | ORAL_CAPSULE | Freq: Every day | ORAL | 0 refills | Status: DC

## 2019-04-10 MED ORDER — TRAZODONE HCL 50 MG PO TABS: 50.0000 mg | ORAL_TABLET | Freq: Every evening | ORAL | 0 refills | Status: DC | PRN

## 2019-04-10 NOTE — Plan of Care (Signed)
  Problem: Education: Goal: Knowledge of [facility] Education information/materials will improve Outcome: Progressing Goal: Emotional status will improve Outcome: Progressing Goal: Mental status will improve Outcome: Progressing Goal: Verbalization of understanding the information provided will improve Outcome: Progressing   Problem: Activity: Goal: Interest or engagement in activities will improve Outcome: Progressing   

## 2019-04-10 NOTE — BHH Suicide Risk Assessment (Signed)
[facility] INPATIENT:  Family/Significant Other Suicide Prevention Education  Suicide Prevention Education:  Education Completed; Pt's wife, [name] White, has been identified by the patient as the family member/significant other with whom the patient will be residing, and identified as the person(s) who will aid the patient in the event of a mental health crisis (suicidal ideations/suicide attempt).  With written consent from the patient, the family member/significant other has been provided the following suicide prevention education, prior to the and/or following the discharge of the patient.  The suicide prevention education provided includes the following:  Suicide risk factors  Suicide prevention and interventions  National Suicide Hotline telephone number  [facility] assessment telephone number  [facility] Emergency Assistance [place] and/or Residential Mobile Crisis Unit telephone number  Request made of family/significant other to:  Remove weapons (e.g., guns, rifles, knives), all items previously/currently identified as safety concern.    Remove drugs/medications (over-the-counter, prescriptions, illicit drugs), all items previously/currently identified as a safety concern.  The family member/significant other verbalizes understanding of the suicide prevention education information provided.  The family member/significant other agrees to remove the items of safety concern listed above.  CSW contacted pt's wife, [name] White. Pt's wife asked if the patient was going to be set up with aftercare and obtain some options for AA meetings. Pt's wife stated that she has been talking to him since he has been here and the pt has stated to her that he has been going to groups and trying to open up more. CSW confirmed that the pt has been going to groups. Pt's wife states that makes her happy but she is scared about his binges. Pt's wife states that she is scared he will  eventually kill himself due to his drinking. She states that when he goes on his binges that he keeps going. He cannot hold a job due to the drinking. Pt's wife confirms that they have firearms but that they are locked up in a safe. Pt's wife states that she will be picking him up around 3:30pm once she gets off of work.   Trecia Rogers 04/10/2019, 10:27 AM

## 2019-04-10 NOTE — BHH Suicide Risk Assessment (Signed)
[facility] Discharge Suicide Risk Assessment   Principal Problem: Substance induced mood disorder ([place]) Discharge Diagnoses: Principal Problem:   Substance induced mood disorder ([place])   Total Time spent with patient: 15 minutes  Musculoskeletal: Strength & Muscle Tone: within normal limits Gait & Station: normal Patient leans: N/A  Psychiatric Specialty Exam: Review of Systems  All other systems reviewed and are negative.   Blood pressure (!) 155/93, pulse 81, temperature 98.1 F (36.7 C), temperature source Oral, resp. rate 20, height 5\' 7"  (1.702 m), weight 79.4 kg, SpO2 99 %.Body mass index is 27.41 kg/m.  General Appearance: Casual  Eye Contact::  Good  Speech:  Normal Rate409  Volume:  Normal  Mood:  Euthymic  Affect:  Congruent  Thought Process:  Coherent and Descriptions of Associations: Intact  Orientation:  Full (Time, Place, and Person)  Thought Content:  Logical  Suicidal Thoughts:  No  Homicidal Thoughts:  No  Memory:  Immediate;   Fair Recent;   Fair Remote;   Fair  Judgement:  Intact  Insight:  Good  Psychomotor Activity:  Normal  Concentration:  Good  Recall:  Good  Fund of Knowledge:Good  Language: Good  Akathisia:  Negative  Handed:  Right  AIMS (if indicated):     Assets:  Desire for Improvement Resilience  Sleep:  Number of Hours: 6.75  Cognition: WNL  ADL's:  Intact   Mental Status Per Nursing Assessment::   On Admission:  Suicidal ideation indicated by others  Demographic Factors:  Male, Caucasian and Low socioeconomic status  Loss Factors: NA  Historical Factors: Impulsivity  Risk Reduction Factors:   Living with another person, especially a relative  Continued Clinical Symptoms:  Depression:   Comorbid alcohol abuse/dependence Impulsivity Alcohol/Substance Abuse/Dependencies  Cognitive Features That Contribute To Risk:  None    Suicide Risk:  Minimal: No identifiable suicidal ideation.  Patients presenting with no risk  factors but with morbid ruminations; may be classified as minimal risk based on the severity of the depressive symptoms  Follow-up Information    [facility] Follow up on 04/14/2019.   Why: Hospital discharge appointment is Friday, 7/10 at 9:30a. Please bring your photo ID, proof of residency, current medications and discharge paperwork from this hospitalization.  Contact information: [place] [id number] [facility] Follow up.   Why: Social Worker Assitant attempted to contact office. Please call and schedule a medication management appointment within 3 days of discharge. Contact information: [address]  [place] [address] Ph: [phone] Fx: [phone]          Plan Of Care/Follow-up recommendations:  Activity:  ad lib  Sharma Covert, MD 04/10/2019, 12:51 PM

## 2019-04-10 NOTE — Discharge Summary (Addendum)
Physician Discharge Summary Note  Patient:  [name] White is an 48 y.o., male MRN:  [id number] DOB:  11/28/1970 Patient phone:  [phone] (home)  Patient address[place]   [address] [place] [address],  Total Time spent with patient: 15 minutes  Date of Admission:  04/07/2019 Date of Discharge: 04/10/2019   Reason for Admission:  Alcohol abuse with suicidal statements  Principal Problem: Substance induced mood disorder [facility](HCC) Discharge Diagnoses: Principal Problem:   Substance induced mood disorder (HCC)   Past Psychiatric History: Denies prior psychiatric hospitalizations. He reports he was admitted for detox at [facility] six years ago. Denies prior rehab.  Past Medical History:  Past Medical History:  Diagnosis Date  . Alcohol abuse, in remission   . Anxiety   . Arthritis   . Avascular necrosis (HCC)   . Depression   . GERD (gastroesophageal reflux disease)   . History of alcohol abuse   . Hyperlipidemia   . Hypertension   . IFG (impaired fasting glucose)   . Overweight     Past Surgical History:  Procedure Laterality Date  . CARDIAC CATHETERIZATION N/A 01/02/2016   Procedure: Left Heart Cath and Coronary Angiography;  Surgeon: Marykay Lexavid W Harding, MD;  Location[facility] INVASIVE CV LAB;  Service: Cardiovascular;  Laterality: N/A;  . CARPAL TUNNEL RELEASE Bilateral    right x 2, left x 1  . DIALYSIS/PERMA CATHETER INSERTION N/A 03/23/2018   Procedure: DIALYSIS/PERMA CATHETER INSERTION;  Surgeon: Renford DillsSchnier, Gregory G, MD;  Location: [facility] INVASIVE CV LAB;  Service: Cardiovascular;  Laterality: N/A;  . TESTICLE TORSION REDUCTION    . TOTAL HIP ARTHROPLASTY Left 01/30/2014   Procedure: LEFT TOTAL HIP ARTHROPLASTY ANTERIOR APPROACH;  Surgeon: Shelda PalMatthew D Olin, MD;  Location: [facility] ORS;  Service: Orthopedics;  Laterality: Left;  Marland Kitchen. VASECTOMY  1995  . VASECTOMY REVERSAL  1997   Family History:  Family History  Problem Relation Age of Onset  . Hypertension Mother   . Diabetes Mother    . Hypertension Father   . Stroke Father   . Cancer Neg Hx   . COPD Neg Hx   . Heart disease Neg Hx    Family Psychiatric  History: Denies Social History:  Social History   Substance and Sexual Activity  Alcohol Use Yes  . Frequency: Never   Comment: 1/5 gallon of liquor daily     Social History   Substance and Sexual Activity  Drug Use No    Social History   Socioeconomic History  . Marital status: Married    Spouse name: Not on file  . Number of children: 5  . Years of education: 7312  . Highest education level: Not on file  Occupational History  . Occupation: lawn care    Comment: [employer]  Social Needs  . Financial resource strain: Not on file  . Food insecurity    Worry: Not on file    Inability: Not on file  . Transportation needs    Medical: Not on file    Non-medical: Not on file  Tobacco Use  . Smoking status: Current Every Day Smoker    Packs/day: 1.00    Years: 30.00    Pack years: 30.00    Types: Cigarettes  . Smokeless tobacco: Never Used  Substance and Sexual Activity  . Alcohol use: Yes    Frequency: Never    Comment: 1/5 gallon of liquor daily  . Drug use: No  . Sexual activity: Not  Currently    Birth control/protection: Surgical  Lifestyle  . Physical activity    Days per week: Not on file    Minutes per session: Not on file  . Stress: Not on file  Relationships  . Social Herbalist on phone: Not on file    Gets together: Not on file    Attends religious service: Not on file    Active member of club or organization: Not on file    Attends meetings of clubs or organizations: Not on file    Relationship status: Not on file  Other Topics Concern  . Not on file  Social History Narrative   epworth sleepiness scale = 6 (12/31/15)    Hospital Course:  From admission H&P: [name] White is a 48 year old male with history of alcohol use disorder, HTN, HLD, GERD, and fatty liver, presenting for treatment under IVC for  reported suicidal statements. According to IVC patient stated he "wanted to drink himself to death" and be with his "true love," his ex-wife who passed away. Patient denies making suicidal statements. He states he has been having increased conflict with his wife and went on a five day drinking binge. He states that he thinks his wife sent suicidal statement from his cell phone to his aunt in order to commit him because she was upset about his drinking. He does admit to problem with binge drinking and reports drinking a fifth each day for the past five days. He denies alcohol dependence prior to the last five days, states he was drinking "maybe twice a week" before. He was seen in the ED in March 2020 requesting detox but chose to leave before being admitted. He is being treated for depression with Effexor XR 75 mg daily from his PCP and reports compliance. He denies feeling depressed or anxious recently. He does report problems with poor sleep and energy. He complains of withdrawal symptoms- shakiness, nausea, restlessness. Denies other drug use. BAL 346. UDS negative. Denies SI/HI/AVH.  [name] White was admitted under IVC for reported suicidal statements while intoxicated. He denied memory of suicidal statements and denied SI throughout admission. He remained on the [facility] unit for three days. CIWA protocol was started with Librium PRN CIWA>10. Effexor was continued. He participated in group therapy on the unit. He stabilized with medication and therapy. He is discharging on the medications listed below. He has shown stable mood, affect, sleep, appetite, and interaction. He denies any SI/HI/AVH and contracts for safety. Denies withdrawal symptoms. Collateral information was obtained from his wife, who denies safety concerns for discharge. He agrees to follow up at [facility] (see below). He is provided with prescriptions for medications upon discharge. His wife is picking him up for discharge  home.  Physical Findings: AIMS: Facial and Oral Movements Muscles of Facial Expression: None, normal Lips and Perioral Area: None, normal Jaw: None, normal Tongue: None, normal,Extremity Movements Upper (arms, wrists, hands, fingers): None, normal Lower (legs, knees, ankles, toes): None, normal, Trunk Movements Neck, shoulders, hips: None, normal, Overall Severity Severity of abnormal movements (highest score from questions above): None, normal Incapacitation due to abnormal movements: None, normal Patient's awareness of abnormal movements (rate only patient's report): No Awareness, Dental Status Current problems with teeth and/or dentures?: No Does patient usually wear dentures?: No  CIWA:  CIWA-Ar Total: 1 COWS:     Musculoskeletal: Strength & Muscle Tone: within normal limits Gait & Station: normal Patient leans: N/A  Psychiatric Specialty Exam:  Physical Exam  Nursing note and vitals reviewed. Constitutional: He is oriented to person, place, and time. He appears well-developed and well-nourished.  Cardiovascular: Normal rate.  Respiratory: Effort normal.  Neurological: He is alert and oriented to person, place, and time.    Review of Systems  Constitutional: Negative.   Psychiatric/Behavioral: Positive for substance abuse. Negative for depression, hallucinations and suicidal ideas. The patient is not nervous/anxious and does not have insomnia.     Blood pressure (!) 155/93, pulse 81, temperature 98.1 F (36.7 C), temperature source Oral, resp. rate 20, height 5\' 7"  (1.702 m), weight 79.4 kg, SpO2 99 %.Body mass index is 27.41 kg/m.  See MD's discharge SRA        Has this patient used any form of tobacco in the last 30 days? (Cigarettes, Smokeless Tobacco, Cigars, and/or Pipes) Yes, a prescription for an FDA-approved medication for tobacco cessation was offered at discharge.   Blood Alcohol level:  Lab Results  Component Value Date   ETH 346 (HH) 04/06/2019   ETH  146 (H) 12/09/2018    Metabolic Disorder Labs:  Lab Results  Component Value Date   HGBA1C 5.8 (H) 03/23/2018   MPG 119.76 03/23/2018   MPG 114 11/04/2017   No results found for: PROLACTIN Lab Results  Component Value Date   CHOL 222 (H) 11/04/2017   TRIG 139 11/04/2017   HDL 41 11/04/2017   CHOLHDL 5.4 (H) 11/04/2017   VLDL 42 (H) 01/14/2017   LDLCALC 155 (H) 11/04/2017   LDLCALC 106 (H) 01/14/2017    See Psychiatric Specialty Exam and Suicide Risk Assessment completed by Attending Physician prior to discharge.  Discharge destination:  Home  Is patient on multiple antipsychotic therapies at discharge:  No   Has Patient had three or more failed trials of antipsychotic monotherapy by history:  No  Recommended Plan for Multiple Antipsychotic Therapies: NA  Discharge Instructions    Discharge instructions   Complete by: As directed    Patient is instructed to take all prescribed medications as recommended. Report any side effects or adverse reactions to your outpatient psychiatrist. Patient is instructed to abstain from alcohol and illegal drugs while on prescription medications. In the event of worsening symptoms, patient is instructed to call the crisis hotline, 911, or go to the nearest emergency department for evaluation and treatment.     Allergies as of 04/10/2019      Reactions   Ace Inhibitors Anaphylaxis   Captopril Anaphylaxis   Enalapril Anaphylaxis   Fosinopril Anaphylaxis   Lisinopril Anaphylaxis   Ramipril Anaphylaxis      Medication List    STOP taking these medications   aspirin EC 81 MG tablet   busPIRone 7.5 MG tablet Commonly known as: BUSPAR   escitalopram 10 MG tablet Commonly known as: Lexapro   hydrALAZINE 10 MG tablet Commonly known as: APRESOLINE   Multivitamin Men Tabs     TAKE these medications     Indication  amLODipine 10 MG tablet Commonly known as: NORVASC Take 1 tablet (10 mg total) by mouth daily.  Indication: High  Blood Pressure Disorder   esomeprazole 40 MG capsule Commonly known as: NEXIUM Take 1 capsule (40 mg total) by mouth daily.  Indication: Gastroesophageal Reflux Disease   metoprolol 200 MG 24 hr tablet Commonly known as: TOPROL-XL Take 1 tablet (200 mg total) by mouth daily. Start taking on: April 11, 2019 What changed:   medication strength  how much to take  Indication: High Blood Pressure  Disorder   nicotine polacrilex 2 MG gum Commonly known as: NICORETTE Take 1 each (2 mg total) by mouth as needed for smoking cessation.  Indication: Nicotine Addiction   traZODone 50 MG tablet Commonly known as: DESYREL Take 1 tablet (50 mg total) by mouth at bedtime as needed for sleep.  Indication: Trouble Sleeping   venlafaxine XR 75 MG 24 hr capsule Commonly known as: EFFEXOR-XR Take 1 capsule (75 mg total) by mouth daily with breakfast. Start taking on: April 11, 2019  Indication: Major Depressive Disorder      Follow-up Information    [facility] Follow up on 04/14/2019.   Why: Hospital discharge appointment is Friday, 7/10 at 9:30a. Please bring your photo ID, proof of residency, current medications and discharge paperwork from this hospitalization.  Contact information: [address] [place] [address] [phone]        [facility].   Why: Social Worker Assitant attempted to contact office. Please call and schedule a medication management appointment within 3 days of discharge. Contact information: [address]  [place] [address] Ph: [phone] Fx: [phone]          Follow-up recommendations: Activity as tolerated. Diet as recommended by primary care physician. Keep all scheduled follow-up appointments as recommended.   Comments:   Patient is instructed to take all prescribed medications as recommended. Report any side effects or adverse reactions to your outpatient psychiatrist. Patient  is instructed to abstain from alcohol and illegal drugs while on prescription medications. In the event of worsening symptoms, patient is instructed to call the crisis hotline, 911, or go to the nearest emergency department for evaluation and treatment.  Signed: Aldean BakerJanet E Briannon Boggio, NP 04/10/2019, 12:30 PM

## 2019-04-10 NOTE — Progress Notes (Signed)
Discharge note: Patient reviewed discharge paperwork with RN including prescriptions, follow up appointments, and lab work. Patient given the opportunity to ask questions. All concerns were addressed. All belongings were returned to patient. Denied SI/HI/AVH. Patient thanked staff for their care while at the hospital.   

## 2019-04-10 NOTE — Tx Team (Signed)
Interdisciplinary Treatment and Diagnostic Plan Update  04/10/2019 Time of Session:  [name] EvenerHerbert L Bromwell MRN: [id number]  Principal Diagnosis: Substance induced mood disorder (HCC)  Secondary Diagnoses: Principal Problem:   Substance induced mood disorder (HCC)   Current Medications:  Current Facility-Administered Medications  Medication Dose Route Frequency Provider Last Rate Last Dose  . acetaminophen (TYLENOL) tablet 650 mg  650 mg Oral Q6H PRN Antonieta Pertlary, Greg Lawson, MD      . alum & mag hydroxide-simeth (MAALOX/MYLANTA) 200-200-20 MG/5ML suspension 30 mL  30 mL Oral Q4H PRN Antonieta Pertlary, Greg Lawson, MD      . amLODipine (NORVASC) tablet 10 mg  10 mg Oral Daily Antonieta Pertlary, Greg Lawson, MD   10 mg at 04/10/19 [id number]  . chlordiazePOXIDE (LIBRIUM) capsule 25 mg  25 mg Oral QID PRN Antonieta Pertlary, Greg Lawson, MD   25 mg at 04/07/19 1517  . folic acid (FOLVITE) tablet 1 mg  1 mg Oral Daily Antonieta Pertlary, Greg Lawson, MD   1 mg at 04/10/19 0845  . hydrOXYzine (ATARAX/VISTARIL) tablet 25 mg  25 mg Oral TID PRN Antonieta Pertlary, Greg Lawson, MD   25 mg at 04/09/19 2112  . magnesium hydroxide (MILK OF MAGNESIA) suspension 30 mL  30 mL Oral Daily PRN Antonieta Pertlary, Greg Lawson, MD      . metoprolol (TOPROL-XL) 24 hr tablet 200 mg  200 mg Oral Daily Antonieta Pertlary, Greg Lawson, MD   200 mg at 04/10/19 [id number]  . nicotine polacrilex (NICORETTE) gum 2 mg  2 mg Oral PRN Antonieta Pertlary, Greg Lawson, MD      . ondansetron [facility](ZOFRAN) tablet 4 mg  4 mg Oral Once PRN Antonieta Pertlary, Greg Lawson, MD      . pantoprazole (PROTONIX) EC tablet 40 mg  40 mg Oral Daily Antonieta Pertlary, Greg Lawson, MD   40 mg at 04/10/19 [id number]  . thiamine (VITAMIN B-1) tablet 100 mg  100 mg Oral Daily Antonieta Pertlary, Greg Lawson, MD   100 mg at 04/10/19 [id number]   Or  . thiamine (B-1) injection 100 mg  100 mg Intravenous Daily Antonieta Pertlary, Greg Lawson, MD      . traZODone (DESYREL) tablet 50 mg  50 mg Oral QHS PRN Antonieta Pertlary, Greg Lawson, MD   50 mg at 04/09/19 2112  . venlafaxine XR (EFFEXOR-XR) 24 hr capsule 75 mg  75 mg Oral Q breakfast  Antonieta Pertlary, Greg Lawson, MD   75 mg at 04/10/19 [id number]   PTA Medications: Medications Prior to Admission  Medication Sig Dispense Refill Last Dose  . amLODipine (NORVASC) 10 MG tablet Take 1 tablet (10 mg total) by mouth daily. 90 tablet 1   . aspirin EC 81 MG tablet Take 81 mg by mouth daily.     . busPIRone (BUSPAR) 7.5 MG tablet Take 1 tablet (7.5 mg total) by mouth at bedtime. (Patient not taking: Reported on 04/07/2019) 30 tablet 1   . escitalopram (LEXAPRO) 10 MG tablet 1 tab Effexor + 1/2 tab Lexapro once daily x7 days, then 1/2tab effexor + 1tab Lexapro once daily x7days, then STOP effexor, take 1 tablet Lexapro once daily (Patient taking differently: Take 10 mg by mouth daily. ) 30 tablet 1   . esomeprazole (NEXIUM) 40 MG capsule Take 1 capsule (40 mg total) by mouth daily. 90 capsule 0   . hydrALAZINE (APRESOLINE) 10 MG tablet Take 1 tablet (10 mg total) by mouth every 8 (eight) hours. (Patient not taking: Reported on 04/07/2019) 270 tablet 1   . metoprolol succinate (TOPROL-XL) 100 MG 24 hr tablet Take 1  tablet (100 mg total) by mouth daily. 90 tablet 1   . Multiple Vitamins-Minerals (MULTIVITAMIN MEN) TABS Take 1 tablet by mouth daily.       Patient Stressors: Financial difficulties Marital or family conflict Substance abuse  Patient Strengths: Ability for insight Active sense of humor Average or above average intelligence Capable of independent living MetallurgistCommunication skills Financial means General fund of knowledge Motivation for treatment/growth Physical Health Work skills  Treatment Modalities: Medication Management, Group therapy, Case management,  1 to 1 session with clinician, Psychoeducation, Recreational therapy.   Physician Treatment Plan for Primary Diagnosis: Substance induced mood disorder (HCC) Long Term Goal(s): Improvement in symptoms so as ready for discharge Improvement in symptoms so as ready for discharge   Short Term Goals: Ability to identify changes in  lifestyle to reduce recurrence of condition will improve Ability to verbalize feelings will improve Ability to disclose and discuss suicidal ideas Ability to demonstrate self-control will improve Ability to identify and develop effective coping behaviors will improve  Medication Management: Evaluate patient's response, side effects, and tolerance of medication regimen.  Therapeutic Interventions: 1 to 1 sessions, Unit Group sessions and Medication administration.  Evaluation of Outcomes: Adequate for Discharge  Physician Treatment Plan for Secondary Diagnosis: Principal Problem:   Substance induced mood disorder (HCC)  Long Term Goal(s): Improvement in symptoms so as ready for discharge Improvement in symptoms so as ready for discharge   Short Term Goals: Ability to identify changes in lifestyle to reduce recurrence of condition will improve Ability to verbalize feelings will improve Ability to disclose and discuss suicidal ideas Ability to demonstrate self-control will improve Ability to identify and develop effective coping behaviors will improve     Medication Management: Evaluate patient's response, side effects, and tolerance of medication regimen.  Therapeutic Interventions: 1 to 1 sessions, Unit Group sessions and Medication administration.  Evaluation of Outcomes: Adequate for Discharge   RN Treatment Plan for Primary Diagnosis: Substance induced mood disorder (HCC) Long Term Goal(s): Knowledge of disease and therapeutic regimen to maintain health will improve  Short Term Goals: Ability to participate in decision making will improve, Ability to verbalize feelings will improve, Ability to disclose and discuss suicidal ideas, Ability to identify and develop effective coping behaviors will improve and Compliance with prescribed medications will improve  Medication Management: RN will administer medications as ordered by provider, will assess and evaluate patient's response  and provide education to patient for prescribed medication. RN will report any adverse and/or side effects to prescribing provider.  Therapeutic Interventions: 1 on 1 counseling sessions, Psychoeducation, Medication administration, Evaluate responses to treatment, Monitor vital signs and CBGs as ordered, Perform/monitor CIWA, COWS, AIMS and Fall Risk screenings as ordered, Perform wound care treatments as ordered.  Evaluation of Outcomes: Adequate for Discharge   LCSW Treatment Plan for Primary Diagnosis: Substance induced mood disorder (HCC) Long Term Goal(s): Safe transition to appropriate next level of care at discharge, Engage patient in therapeutic group addressing interpersonal concerns.  Short Term Goals: Engage patient in aftercare planning with referrals and resources  Therapeutic Interventions: Assess for all discharge needs, 1 to 1 time with Social worker, Explore available resources and support systems, Assess for adequacy in community support network, Educate family and significant other(s) on suicide prevention, Complete Psychosocial Assessment, Interpersonal group therapy.  Evaluation of Outcomes: Adequate for Discharge   Progress in Treatment: Attending groups: Yes. Participating in groups: Yes. Taking medication as prescribed: Yes. Toleration medication: Yes. Family/Significant other contact made: No, will contact:  the patient's aunt Patient understands diagnosis: Yes. Discussing patient identified problems/goals with staff: Yes. Medical problems stabilized or resolved: Yes. Denies suicidal/homicidal ideation: Yes. Issues/concerns per patient self-inventory: No. Other:   New problem(s) identified: None   New Short Term/Long Term Goal(s): medication stabilization, elimination of SI thoughts, development of comprehensive mental wellness plan.    Patient Goals:    Discharge Plan or Barriers: Patient recently admitted. CSW will continue to follow and assess for  appropriate referrals and possible discharge planning.    Reason for Continuation of Hospitalization: None   Estimated Length of Stay: 04/10/2019  Attendees: Patient: 04/10/2019 9:17 AM  Physician: Dr. Myles Lipps, MD 04/10/2019 9:17 AM  Nursing: Yetta Flock.Lenna Sciara RN  04/10/2019 9:17 AM  RN Care Manager: 04/10/2019 9:17 AM  Social Worker: Radonna Ricker, [place] 04/10/2019 9:17 AM  Recreational Therapist:  04/10/2019 9:17 AM  Other:  04/10/2019 9:17 AM  Other:  04/10/2019 9:17 AM  Other: 04/10/2019 9:17 AM    Scribe for Treatment Team: Marylee Floras, [place] 04/10/2019 9:17 AM

## 2019-04-10 NOTE — Progress Notes (Signed)
  [facility] Adult Case Management Discharge Plan :  Will you be returning to the same living situation after discharge:  Yes,  home with wife At discharge, do you have transportation home?: Yes,  pt's wife Do you have the ability to pay for your medications: No.; [facility]  Release of information consent forms completed and in the chart;  Patient's signature needed at discharge.  Patient to Follow up at: Follow-up Information    [facility] Follow up on 04/14/2019.   Why: Hospital discharge appointment is Friday, 7/10 at 9:30a. Please bring your photo ID, proof of residency, current medications and discharge paperwork from this hospitalization.  Contact information: [place] [address] [facility] Follow up.   Contact information: [address]  [place] [address] Ph: [phone] Fx:           Next level of care provider has access to [place] and Suicide Prevention discussed: Yes,  pt's wife     Has patient been referred to the Quitline?: Patient refused referral  Patient has been referred for addiction treatment: Yes  Trecia Rogers, LCSW 04/10/2019, 11:49 AM

## 2019-04-21 ENCOUNTER — Ambulatory Visit: Payer: Self-pay | Admitting: Family Medicine

## 2019-04-26 ENCOUNTER — Ambulatory Visit: Payer: Self-pay | Admitting: Family Medicine

## 2019-06-25 ENCOUNTER — Telehealth: Payer: Self-pay | Admitting: Family Medicine

## 2019-06-26 ENCOUNTER — Other Ambulatory Visit: Payer: Self-pay | Admitting: Family Medicine

## 2019-06-26 MED ORDER — VENLAFAXINE HCL ER 75 MG PO CP24: 75.0000 mg | ORAL_CAPSULE | Freq: Every day | ORAL | 0 refills | Status: DC

## 2019-06-26 NOTE — Telephone Encounter (Signed)
appt scheduled with Leisa for 10.12.2020-he is also needing a refill on venlafaxine

## 2019-06-26 NOTE — Telephone Encounter (Signed)
Requested medication (s) are due for refill today: yes  Requested medication (s) are on the active medication list: yes  Last refill:  05/20/2019  Future visit scheduled: no  Notes to clinic:  Review for refill   Requested Prescriptions  Pending Prescriptions Disp Refills   esomeprazole ([place]rison) 40 MG capsule [Pharmacy Med Name: ESOMEPRAZOLE MAG DR 40 MG CAP] 30 capsule 0    Sig: TAKE ONE CAPSULE BY MOUTH DAILY     Gastroenterology: Proton Pump Inhibitors Passed - 06/25/2019  5:50 PM      Passed - Valid encounter within last 12 months    Recent Outpatient Visits          3 months ago [place], FNP   5 months ago Essential hypertension   [name], Satira Anis, MD   1 year ago Essential hypertension   [name], Satira Anis, MD   2 years ago Essential hypertension   [name], Satira Anis, MD   3 years ago Essential hypertension   [facility], Satira Anis, MD

## 2019-07-17 ENCOUNTER — Ambulatory Visit: Payer: Self-pay | Admitting: Family Medicine

## 2019-07-22 IMAGING — DX DG CHEST 1V PORT
1 series · 1 of 1 positions shown · non-contrast
Comparison: 07/12/2017

CLINICAL DATA: Generalize cramping which shortness-of-breath and
weakness. Symptoms beginning this morning.

EXAM:
PORTABLE CHEST 1 VIEW

[chest ap]
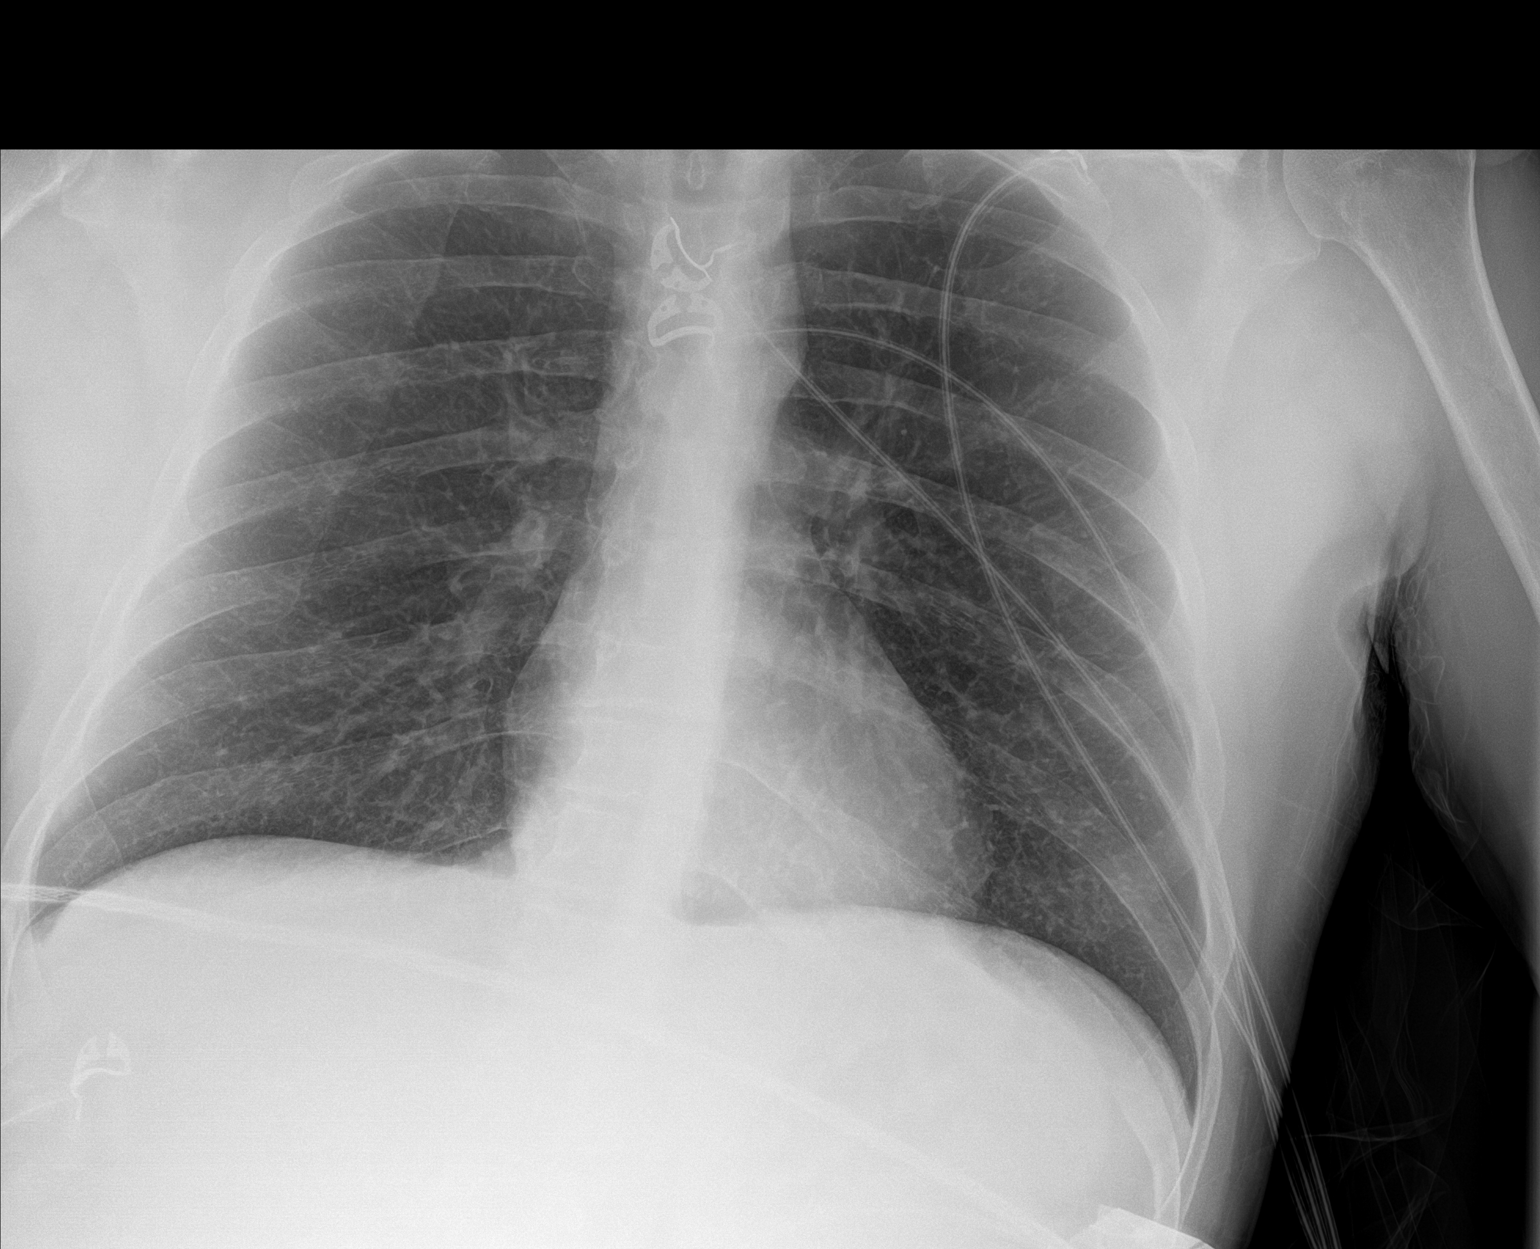

[1 of 1 positions shown; findings below may reference images not displayed]

FINDINGS: The heart size and mediastinal contours are within normal limits.
Both lungs are clear. The visualized skeletal structures are
unremarkable.
IMPRESSION: No active disease.

## 2019-07-31 ENCOUNTER — Other Ambulatory Visit: Payer: Self-pay

## 2019-07-31 ENCOUNTER — Encounter: Payer: Self-pay | Admitting: Family Medicine

## 2019-07-31 ENCOUNTER — Ambulatory Visit (INDEPENDENT_AMBULATORY_CARE_PROVIDER_SITE_OTHER): Payer: Self-pay | Admitting: Family Medicine

## 2019-07-31 VITALS — BP 163/99 | HR 81

## 2019-07-31 DIAGNOSIS — Z5181 Encounter for therapeutic drug level monitoring: Secondary | ICD-10-CM

## 2019-07-31 DIAGNOSIS — F413 Other mixed anxiety disorders: Secondary | ICD-10-CM

## 2019-07-31 DIAGNOSIS — F330 Major depressive disorder, recurrent, mild: Secondary | ICD-10-CM

## 2019-07-31 DIAGNOSIS — F10188 Alcohol abuse with other alcohol-induced disorder: Secondary | ICD-10-CM

## 2019-07-31 DIAGNOSIS — I10 Essential (primary) hypertension: Secondary | ICD-10-CM

## 2019-07-31 DIAGNOSIS — K219 Gastro-esophageal reflux disease without esophagitis: Secondary | ICD-10-CM

## 2019-07-31 DIAGNOSIS — E782 Mixed hyperlipidemia: Secondary | ICD-10-CM

## 2019-07-31 DIAGNOSIS — F1994 Other psychoactive substance use, unspecified with psychoactive substance-induced mood disorder: Secondary | ICD-10-CM

## 2019-07-31 DIAGNOSIS — R7301 Impaired fasting glucose: Secondary | ICD-10-CM

## 2019-07-31 DIAGNOSIS — R7989 Other specified abnormal findings of blood chemistry: Secondary | ICD-10-CM

## 2019-07-31 DIAGNOSIS — Z720 Tobacco use: Secondary | ICD-10-CM

## 2019-07-31 MED ORDER — VENLAFAXINE HCL ER 150 MG PO CP24: 150.0000 mg | ORAL_CAPSULE | Freq: Every day | ORAL | 1 refills | Status: DC

## 2019-07-31 MED ORDER — METOPROLOL SUCCINATE ER 100 MG PO TB24: 100.0000 mg | ORAL_TABLET | Freq: Every day | ORAL | 1 refills | Status: DC

## 2019-07-31 MED ORDER — AMLODIPINE BESYLATE 10 MG PO TABS: 10.0000 mg | ORAL_TABLET | Freq: Every day | ORAL | 1 refills | Status: DC

## 2019-07-31 MED ORDER — BUSPIRONE HCL 5 MG PO TABS: 5.0000 mg | ORAL_TABLET | Freq: Three times a day (TID) | ORAL | 0 refills | Status: DC

## 2019-07-31 NOTE — Progress Notes (Signed)
Name: [name] White   MRN: [id number]    DOB: 17-Oct-1970   Date:07/31/2019       Progress Note  Subjective:    Chief Complaint  Chief Complaint  Patient presents with   Medication Refill   Depression    PT states med stopped working   Hypertension    out of norvasc and only taking 100mg  of metoprolol   Nicotine Dependence    would like to stop and get rx for chantix    I connected with  Vence L Hannum  on 07/31/19 at  1:20 PM EDT by a video enabled telemedicine application and verified that I am speaking with the correct person using two identifiers.  I discussed the limitations of evaluation and management by telemedicine and the availability of in person appointments. The patient expressed understanding and agreed to proceed. Staff also discussed with the patient that there may be a patient responsible charge related to this service. Patient Location: home on his porch Provider Location: [facility] clinic Additional Individuals present: none  HPI  Anxiety and mood disorder, pt also just stopped drinking alcohol 10 days ago His wife was also out of town and that makes it hard for him to sleep so his moods anxiety energy, being irritable have all been much worse than normal.  He has been on venlafaxine over the past 6 months when he started the medication he started on 37.5 mg dose and it was effective for him but a few months ago he did increase the dose again to 75 mg.  He reports that the dose increase also did help him feel good for a while but now he feels like he needs more medication or change in medication.  He is inquiring about Brilinta. He did go inpatient for alcohol withdrawal and his metoprolol was increased from 100 mg to 200 mg daily.  Since getting out of hospital and being back at home that made him feel very sluggish very bad and fatigued so he decreased it to 100 mg daily.  He has not had any withdrawal symptoms such as tremor, seizures, nausea, vomiting, sweats,  but moods are worse. Depression screen [facility] 2/9 07/31/2019 03/22/2019 11/04/2017  Decreased Interest 1 - 0  Down, Depressed, Hopeless 2 1 0  PHQ - 2 Score 3 1 0  Altered sleeping 3 1 -  Tired, decreased energy 2 0 -  Change in appetite 0 0 -  Feeling bad or failure about yourself  0 0 -  Trouble concentrating 1 0 -  Moving slowly or fidgety/restless 0 0 -  Suicidal thoughts 0 0 -  PHQ-9 Score 9 2 -  Difficult doing work/chores Somewhat difficult Somewhat difficult -     Hypertension:  Currently managed on metoprolol, amlodipine and hydralazine He has difficult to control BP, he can feel when its high, usually >170/120, has slight discomfort in his head and can feel and hear his heartbeat in his pulse.  He does monitor his blood pressure at home, currently is elevated at 163/99 but that is very good for him he states. He reports med non-compliance.  As per above he decreased the metoprolol dose because it made him feel bad.  He is unable to take hydralazine 3 times a day because it causes diarrhea.  He has run out of amlodipine. Currently denies lightheadedness, hypotension, syncope, CP, SOB, exertional sx, LE edema, palpitation, Ha's, visual disturbances  BP Readings from Last 3 Encounters:  07/31/19 (!) 163/99  04/07/19 06/08/19)  154/89  12/10/18 (!) 181/98   Dietary efforts for BP?  None right now  Patient denies any other substance abuse specifically denies any cocaine use.   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    Past Surgical History:  Procedure Laterality Date   CARDIAC CATHETERIZATION N/A 01/02/2016   Procedure: Left Heart Cath and Coronary Angiography;  Surgeon: Marykay Lexavid W Harding, MD;  [facility];  Service: Cardiovascular;  Laterality: N/A;   CARPAL TUNNEL RELEASE Bilateral    right x 2, left x 1   DIALYSIS/PERMA CATHETER INSERTION N/A 03/23/2018   Procedure: DIALYSIS/PERMA CATHETER INSERTION;  Surgeon: Renford DillsSchnier, Gregory G, MD;  Location: [facility] INVASIVE CV LAB;  Service: Cardiovascular;  Laterality: N/A;   TESTICLE TORSION REDUCTION     TOTAL HIP ARTHROPLASTY Left 01/30/2014   Procedure: LEFT TOTAL HIP ARTHROPLASTY ANTERIOR APPROACH;  Surgeon: Shelda PalMatthew D Olin, MD;  Location: [facility] ORS;  Service: Orthopedics;  Laterality: Left;   VASECTOMY  1995   VASECTOMY REVERSAL  1997    Family History  Problem Relation Age of Onset   Hypertension Mother    Diabetes Mother    Hypertension Father    Stroke Father    Cancer Neg Hx    COPD Neg Hx    Heart disease Neg Hx     Social History   Socioeconomic History   Marital status: Married    Spouse name: Not on file   Number of children: 5   Years of education: 12   Highest education level: Not on file  Occupational History   Occupation: lawn care    Comment: [employer]  Social Needs   Financial resource strain: Not on file   Food insecurity    Worry: Not on file    Inability: Not on file   Transportation needs    Medical: Not on file    Non-medical: Not on file  Tobacco Use   Smoking status: Current Every Day Smoker    Packs/day: 1.00    Years: 30.00    Pack years: 30.00    Types: Cigarettes   Smokeless tobacco: Never Used   Tobacco comment: discussed options   Substance and Sexual Activity   Alcohol use: Not Currently    Frequency: Never    Comment: d/c ETOH 10 d ago 07/21/2019   Drug use: No    Comment: denies    Sexual activity: Not Currently    Birth control/protection: Surgical  Lifestyle   Physical activity    Days per week: Not on file    Minutes per session: Not on file   Stress: Not on file  Relationships   Social connections    Talks on phone: Not on file    Gets together: Not on file    Attends religious service: Not on file    Active member of club or organization: Not on file    Attends  meetings of clubs or organizations: Not on file    Relationship status: Not on file   Intimate partner violence    Fear of current or ex partner: Not on file    Emotionally abused: Not on file    Physically abused: Not on file    Forced sexual activity: Not on file  Other Topics Concern   Not on file  Social History Narrative   epworth sleepiness scale = 6 (12/31/15)     Current Outpatient Medications:    esomeprazole (NEXIUM) 40 MG capsule, TAKE ONE CAPSULE BY MOUTH DAILY, Disp: 30 capsule, Rfl: 0   metoprolol (TOPROL-XL) 100 MG 24 hr tablet, Take 1 tablet (100 mg total) by mouth daily., Disp: 90 tablet, Rfl: 1   amLODipine (NORVASC) 10 MG tablet, Take 1 tablet (10 mg total) by mouth daily., Disp: 90 tablet, Rfl: 1   busPIRone (BUSPAR) 5 MG tablet, Take 1-2 tablets (5-10 mg total) by mouth 3 (three) times daily., Disp: 120 tablet, Rfl: 0   nicotine polacrilex (NICORETTE) 2 MG gum, Take 1 each (2 mg total) by mouth as needed for smoking cessation. (Patient not taking: Reported on 07/31/2019), Disp: 100 tablet, Rfl: 0   venlafaxine XR (EFFEXOR-XR) 150 MG 24 hr capsule, Take 1 capsule (150 mg total) by mouth daily with breakfast., Disp: 30 capsule, Rfl: 1  Allergies  Allergen Reactions   Ace Inhibitors Anaphylaxis   Captopril Anaphylaxis   Enalapril Anaphylaxis   Fosinopril Anaphylaxis   Lisinopril Anaphylaxis   Ramipril Anaphylaxis    I personally reviewed active problem list, medication list, allergies, family history, social history, health maintenance, notes from last  encounter, lab results, imaging with the patient/caregiver today.  Review of Systems  Constitutional: Negative.  Negative for activity change, appetite change, chills, diaphoresis and fever.  HENT: Negative.   Eyes: Negative.   Respiratory: Negative.  Negative for shortness of breath and wheezing.   Cardiovascular: Negative.  Negative for chest pain, palpitations and leg swelling.  Gastrointestinal: Negative.  Negative for abdominal pain, diarrhea, nausea and vomiting.  Endocrine: Negative.   Genitourinary: Negative.   Musculoskeletal: Negative.   Skin: Negative.  Negative for color change, pallor and rash.  Allergic/Immunologic: Negative.   Neurological: Negative.  Negative for dizziness, tremors, seizures, syncope, speech difficulty, weakness, light-headedness, numbness and headaches.  Hematological: Negative.   Psychiatric/Behavioral: Positive for agitation, decreased concentration, dysphoric mood and sleep disturbance. Negative for self-injury and suicidal ideas. The patient is nervous/anxious.   All other systems reviewed and are negative.     Objective:    Virtual encounter, vitals limited, only able to obtain the following Today's Vitals   07/31/19 1142  BP: (!) 163/99  Pulse: 81   There is no height or weight on file to calculate BMI. Nursing Note and Vital Signs reviewed.  Physical Exam Vitals signs and nursing note reviewed.  Constitutional:      General: He is not in acute distress.    Appearance: He is well-developed. He is obese. He is not ill-appearing, toxic-appearing or diaphoretic.  HENT:     Head: Normocephalic and atraumatic.     Nose: Nose normal.     Mouth/Throat:     Mouth: Mucous membranes are moist.  Eyes:     General:        Right eye: No discharge.        Left eye: No discharge.     Conjunctiva/sclera: Conjunctivae normal.  Neck:     Trachea: No tracheal deviation.  Pulmonary:     Effort: Pulmonary effort is normal. No respiratory distress.      Breath sounds: No stridor.  Skin:    Findings: No rash.  Neurological:     Mental Status: He is alert.     Motor: No abnormal muscle tone.     Coordination: Coordination normal.  Psychiatric:        Attention and Perception: Attention normal.        Mood and Affect: Mood normal.        Speech: Speech normal.        Behavior: Behavior normal. Behavior is cooperative.        Thought Content: Thought content does not include homicidal or suicidal ideation. Thought content does not include homicidal or suicidal plan.     PE limited by telephone encounter  No results found for this or any previous visit (from the past 72 hour(s)).  PHQ2/9: Depression screen [facility] 2/9 07/31/2019 03/22/2019 11/04/2017 01/14/2017 11/06/2015  Decreased Interest 1 - 0 0 0  Down, Depressed, Hopeless 2 1 0 1 1  PHQ - 2 Score 3 1 0 1 1  Altered sleeping 3 1 - - -  Tired, decreased energy 2 0 - - -  Change in appetite 0 0 - - -  Feeling bad or failure about yourself  0 0 - - -  Trouble concentrating 1 0 - - -  Moving slowly or fidgety/restless 0 0 - - -  Suicidal thoughts 0 0 - - -  PHQ-9 Score 9 2 - - -  Difficult doing work/chores Somewhat difficult Somewhat difficult - - -   PHQ-2/9 Result is positive - see HPI.    Fall Risk: Fall Risk  03/22/2019 01/20/2019 11/04/2017 01/14/2017 05/17/2015  Falls in the past year? 0 0 Yes No No  Number falls in past yr: 0 - 1 - -  Injury with Fall? 0 - No - -     Assessment and Plan:        ICD-10-CM   1. Substance induced mood disorder (HCC)  F19.94    worse, dose increase of effexor, add buspar, recent detox from ETOH, close f/up and monitoring  2. MDD (major depressive disorder), recurrent episode, mild (HCC)  F33.0    see above - PHQ score positive, but no SI, HI, or AVH  3. Other mixed anxiety disorders  F41.3   4. 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 Essential hypertension  I10 CBC with Differential/Platelet    COMPLETE METABOLIC PANEL WITH GFR    Lipid panel   BP elevated, "good for him" and out of meds, on metorprolol 100 mg, cannot tolerate hydralazine, refill norvasc and metoprolol, consider change to bystolic?  6. Mixed hyperlipidemia  E78.2 CBC with Differential/Platelet    COMPLETE METABOLIC PANEL WITH GFR    Lipid panel   not on meds, pt denies hx of high cholesterol, pt new to me, recheck labs  7. IFG (impaired fasting glucose)  R73.01    recheck with labs/A1C  8. Encounter for medication monitoring  Z51.81 CBC with Differential/Platelet    COMPLETE METABOLIC PANEL WITH GFR    Lipid panel  9. Gastroesophageal reflux disease without esophagitis  K21.9    well contolled with OTC PPI  10. Elevated liver function tests  R79.89 CBC with Differential/Platelet  COMPLETE METABOLIC PANEL WITH GFR    Lipid panel   recheck, ETOH abuse, obesity, HLD  11. Tobacco abuse  Z72.0    desires to quit, discussed chantix, pt needs to have mood well controlled before that is an option, will need to review chart to see if safe       I discussed the assessment and treatment plan with the patient. The patient was provided an opportunity to ask questions and all were answered. The patient agreed with the plan and demonstrated an understanding of the instructions.  The patient was advised to call back or seek an in-person evaluation if the symptoms worsen or if the condition fails to improve as anticipated.  I provided 19 minutes of non-face-to-face time during this encounter.  Danelle Berry, PA-C 10/26/202:17 PM

## 2019-08-10 IMAGING — US US RENAL
1 series · 14 of 25 positions shown · non-contrast
Comparison: Lumbar MRI 04/05/2013.

CLINICAL DATA: 47-year-old male with acute kidney injury.

EXAM:
RENAL / URINARY TRACT ULTRASOUND COMPLETE

[Series 1: us renal · 0.28mm/px · 14 of 31 slices shown]
[im 1/31]
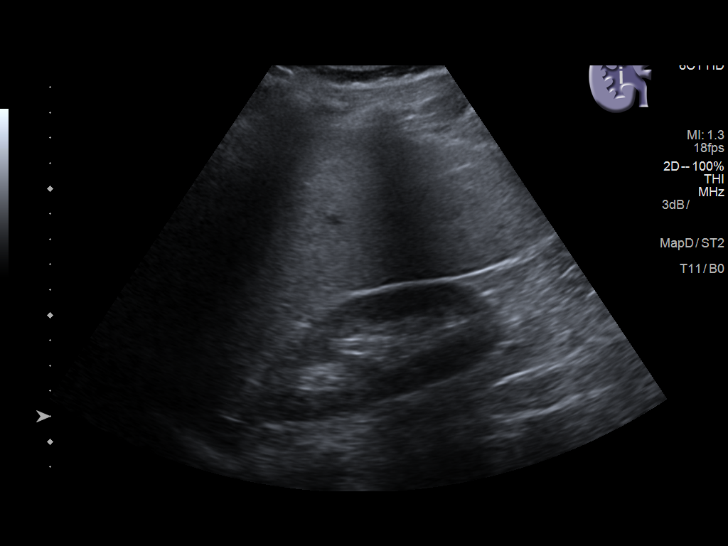
[im 3/31]
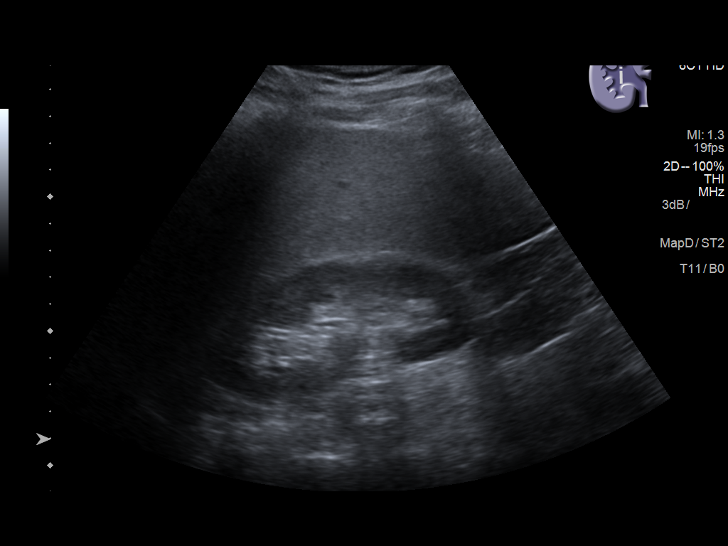
[im 6/31]
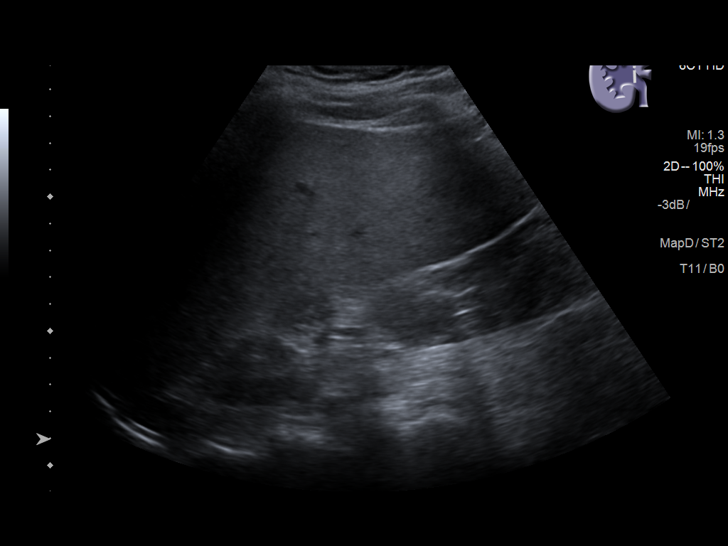
[im 8/31]
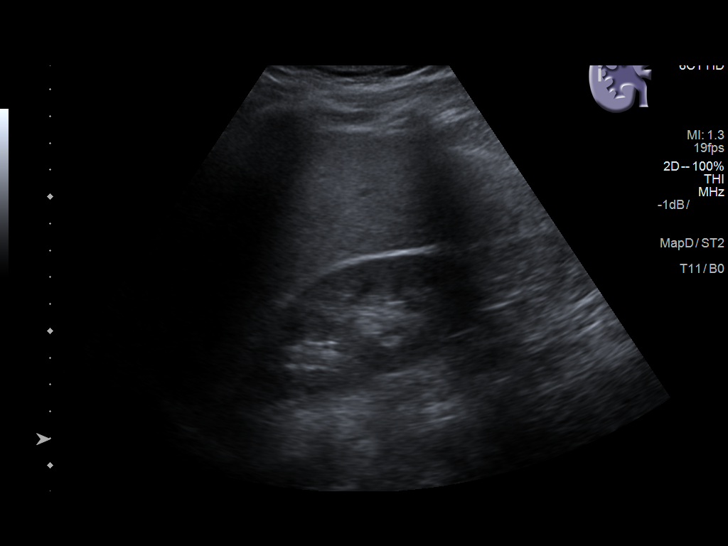
[im 11/31]
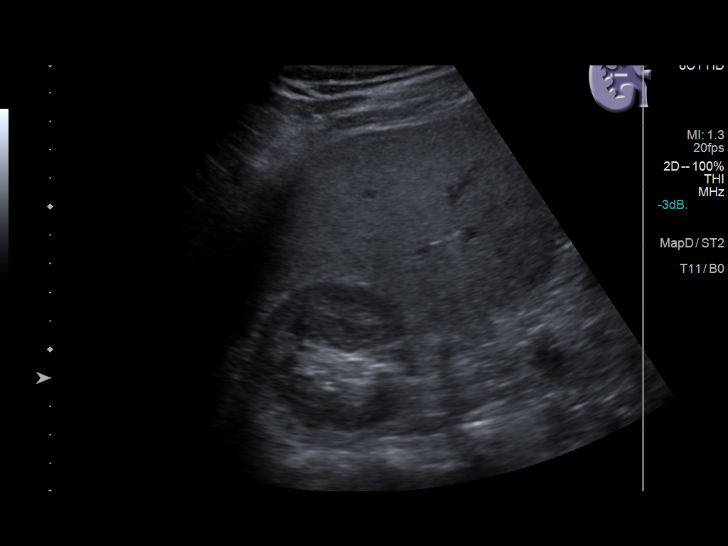
[im 12/31]
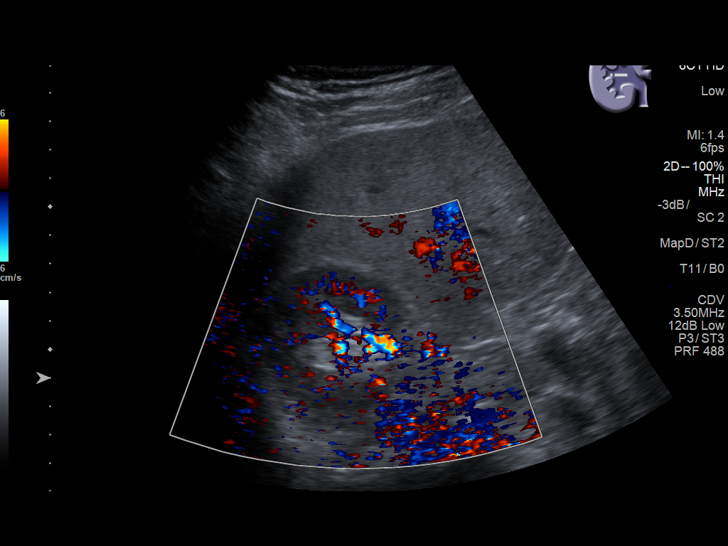
[im 14/31]
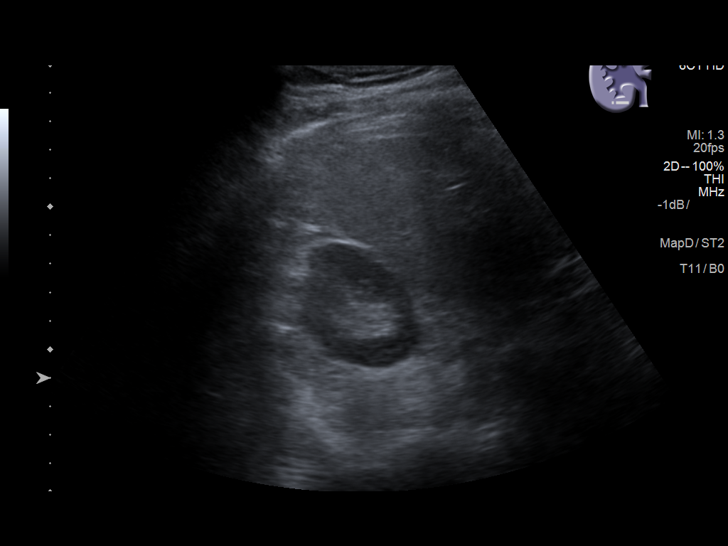
[im 17/31]
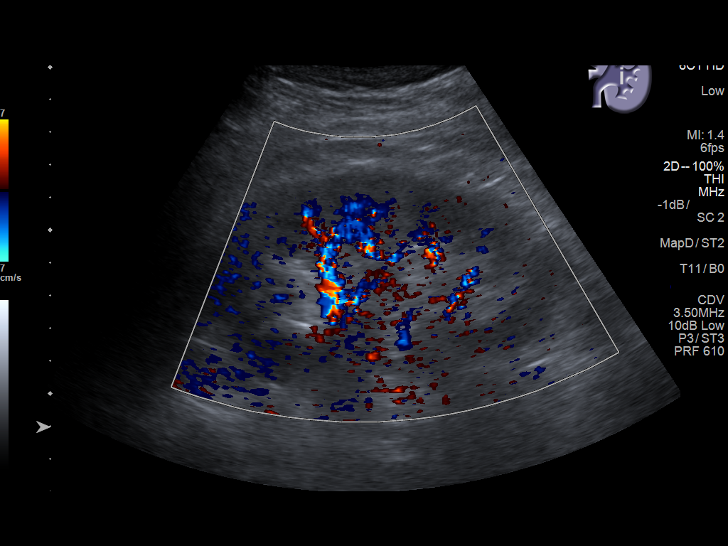
[im 19/31]
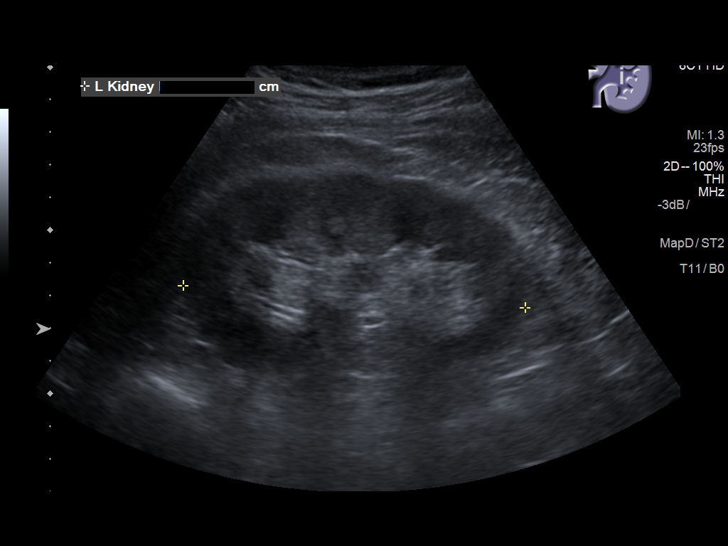
[im 21/31]
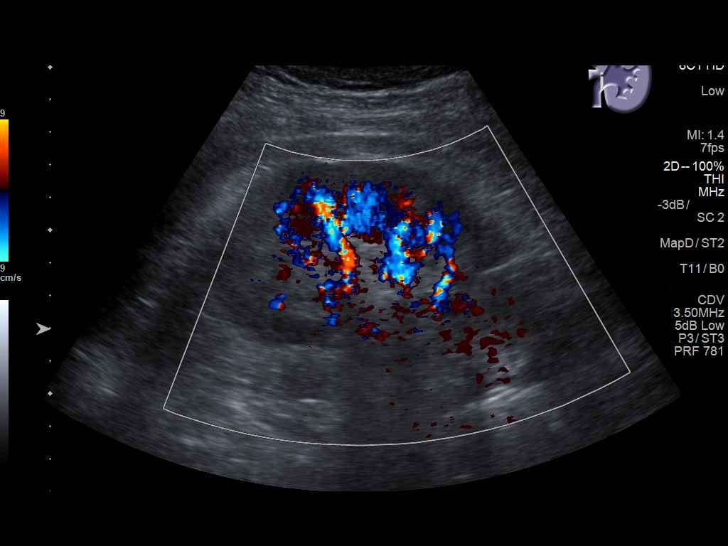
[im 23/31]
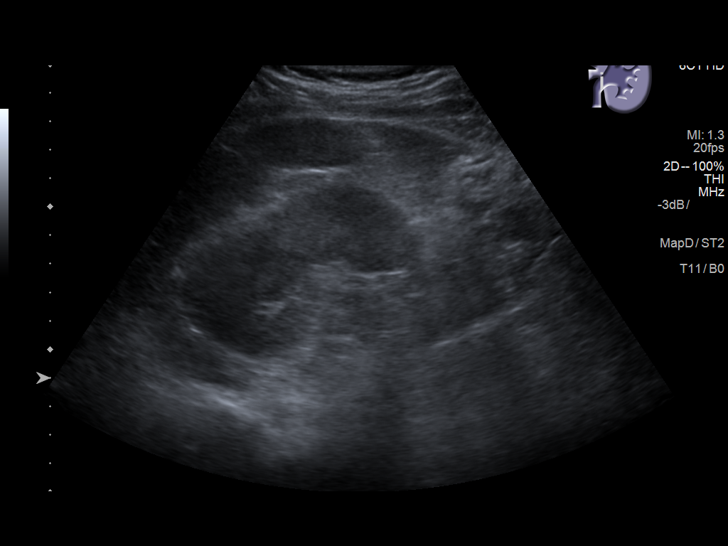
[im 26/31]
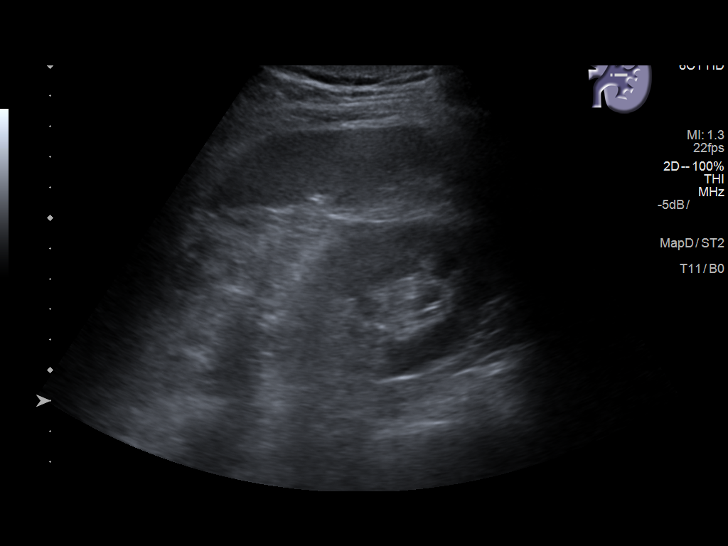
[im 28/31]
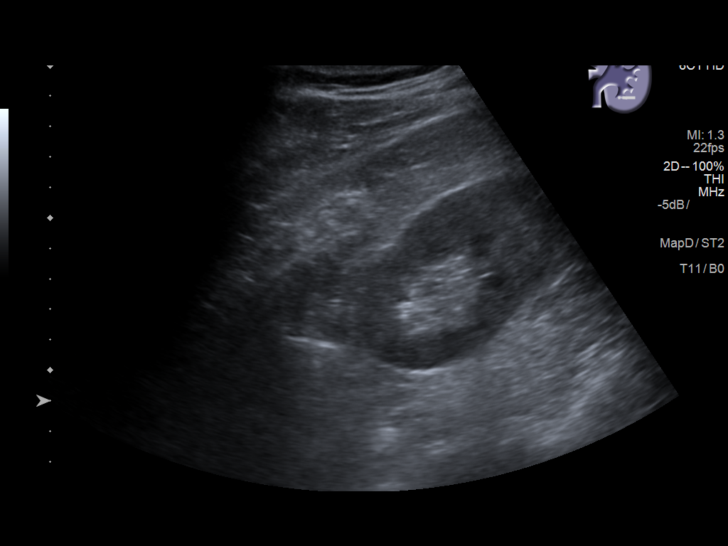
[im 31/31]
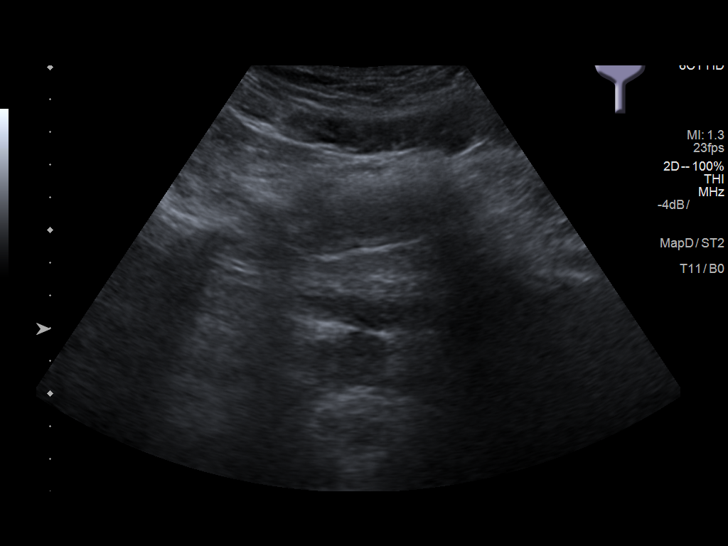

[14 of 25 positions shown; findings below may reference images not displayed]

FINDINGS: Right Kidney:

Length: 10.6 centimeters. Echogenicity within normal limits. No mass
or hydronephrosis visualized.

Left Kidney:

Length: 10.5 centimeters. Echogenicity within normal limits. No mass
or hydronephrosis visualized.

Bladder:

Not identified, the patient voided just prior to imaging.

Other findings: Echogenic liver (image 2).
IMPRESSION: 1. Normal ultrasound appearance of both kidneys. Decompressed
urinary bladder.
2. Hepatic steatosis.

## 2019-10-02 ENCOUNTER — Telehealth: Payer: Self-pay | Admitting: Family Medicine

## 2019-10-02 NOTE — Telephone Encounter (Signed)
Pt is scheduled °

## 2019-10-19 ENCOUNTER — Ambulatory Visit: Payer: 59 | Admitting: Family Medicine

## 2019-10-19 ENCOUNTER — Other Ambulatory Visit: Payer: Self-pay

## 2019-11-01 ENCOUNTER — Other Ambulatory Visit: Payer: Self-pay | Admitting: Family Medicine

## 2019-11-01 NOTE — Telephone Encounter (Signed)
Pt needs appt before refills 

## 2019-11-01 NOTE — Telephone Encounter (Signed)
Pt just had an appt on 10-19-2019 does he still need another one. I know he will say he just had one when I call

## 2019-11-02 ENCOUNTER — Ambulatory Visit (INDEPENDENT_AMBULATORY_CARE_PROVIDER_SITE_OTHER): Payer: 59 | Admitting: Family Medicine

## 2019-11-02 ENCOUNTER — Other Ambulatory Visit: Payer: Self-pay

## 2019-11-02 ENCOUNTER — Encounter: Payer: Self-pay | Admitting: Family Medicine

## 2019-11-02 DIAGNOSIS — I10 Essential (primary) hypertension: Secondary | ICD-10-CM | POA: Diagnosis not present

## 2019-11-02 DIAGNOSIS — F1010 Alcohol abuse, uncomplicated: Secondary | ICD-10-CM

## 2019-11-02 DIAGNOSIS — F3341 Major depressive disorder, recurrent, in partial remission: Secondary | ICD-10-CM | POA: Diagnosis not present

## 2019-11-02 DIAGNOSIS — F419 Anxiety disorder, unspecified: Secondary | ICD-10-CM | POA: Diagnosis not present

## 2019-11-02 MED ORDER — VENLAFAXINE HCL ER 150 MG PO CP24: ORAL_CAPSULE | ORAL | 1 refills | Status: DC

## 2019-11-02 NOTE — Telephone Encounter (Signed)
Pt made a virtual (11-02-2019) with Irving Burton. Tapia no aval

## 2019-11-02 NOTE — Progress Notes (Signed)
Name: [name] White   MRN: [id number]    DOB: August 27, 1971   Date:11/02/2019       Progress Note  Subjective  Chief Complaint  Chief Complaint  Patient presents with  . Depression    follow up, medication refill  . Hypertension    I connected with  [name] White on 11/02/19 at 10:20 AM EST by telephone and verified that I am speaking with the correct person using two identifiers.  I discussed the limitations, risks, security and privacy concerns of performing an evaluation and management service by telephone and the availability of in person appointments. Staff also discussed with the patient that there may be a patient responsible charge related to this service. Patient Location: Home Provider Location: Office Additional Individuals present: None  HPI  Pt presents today to follow up - he last saw [name] Chessman PA-C 07/31/2019  Anxiety and Depression + ETOH Abuse: At last visit Tapia PA-C added buspirone and increased Venlafaxine to 150mg .  Stopped Buspirone because he felt like it gave him diarrhea. He feels like he is doing well on just Venlafaxine. No recent panic attacks.  Drinking alcohol about once every 2-3 weeks, will have about 2 16oz beers.  Also taking metoprolol and this is working well for him.  HTN: Taking Metoprolol and amlodipine 10mg . Denies chest pain, shortness of breath, palpitations, or BLE edema, no headaches. Following low salt diet, not checking BP's at home regularly.  Patient Active Problem List   Diagnosis Date Noted  . Substance induced mood disorder (HCC) 04/07/2019  . Abnormal nuclear stress test 12/31/2015  . Snoring 11/06/2015  . Elevated liver function tests 05/19/2015  . Fatty liver 05/19/2015  . Elevated serum GGT level 05/17/2015  . IFG (impaired fasting glucose)   . Hyperlipidemia   . Essential hypertension   . Depression   . Anxiety   . GERD (gastroesophageal reflux disease)   . Overweight   . Alcohol abuse, episodic   . History of  total left hip arthroplasty 01/30/2014    Past Surgical History:  Procedure Laterality Date  . CARDIAC CATHETERIZATION N/A 01/02/2016   Procedure: Left Heart Cath and Coronary Angiography;  Surgeon: 02/01/2014, MD;  Location: [facility] INVASIVE CV LAB;  Service: Cardiovascular;  Laterality: N/A;  . CARPAL TUNNEL RELEASE Bilateral    right x 2, left x 1  . DIALYSIS/PERMA CATHETER INSERTION N/A 03/23/2018   Procedure: DIALYSIS/PERMA CATHETER INSERTION;  Surgeon: [facility], MD;  Location: [facility] INVASIVE CV LAB;  Service: Cardiovascular;  Laterality: N/A;  . TESTICLE TORSION REDUCTION    . TOTAL HIP ARTHROPLASTY Left 01/30/2014   Procedure: LEFT TOTAL HIP ARTHROPLASTY ANTERIOR APPROACH;  Surgeon: Renford Dills, MD;  Location: [facility] ORS;  Service: Orthopedics;  Laterality: Left;  02/01/2014 VASECTOMY  1995  . VASECTOMY REVERSAL  1997    Family History  Problem Relation Age of Onset  . Hypertension Mother   . Diabetes Mother   . Hypertension Father   . Stroke Father   . Cancer Neg Hx   . COPD Neg Hx   . Heart disease Neg Hx     Social History   Socioeconomic History  . Marital status: Married    Spouse name: Not on file  . Number of children: 5  . Years of education: 56  . Highest education level: Not on file  Occupational History  . Occupation: lawn care    Comment: [employer]  Tobacco Use  .  Smoking status: Current Every Day Smoker    Packs/day: 1.00    Years: 30.00    Pack years: 30.00    Types: Cigarettes  . Smokeless tobacco: Never Used  . Tobacco comment: discussed options   Substance and Sexual Activity  . Alcohol use: Not Currently    Comment: d/c ETOH 10 d ago 07/21/2019  . Drug use: No    Comment: denies  . Sexual activity: Not Currently    Birth control/protection: Surgical  Other Topics Concern  . Not on file  Social History Narrative   epworth sleepiness scale = 6 (12/31/15)   Social Determinants of Health   Financial Resource Strain:   .  Difficulty of Paying Living Expenses: Not on file  Food Insecurity:   . Worried About Programme researcher, broadcasting/film/video in the Last Year: Not on file  . Ran Out of Food in the Last Year: Not on file  Transportation Needs:   . Lack of Transportation (Medical): Not on file  . Lack of Transportation (Non-Medical): Not on file  Physical Activity:   . Days of Exercise per Week: Not on file  . Minutes of Exercise per Session: Not on file  Stress:   . Feeling of Stress : Not on file  Social Connections:   . Frequency of Communication with Friends and Family: Not on file  . Frequency of Social Gatherings with Friends and Family: Not on file  . Attends Religious Services: Not on file  . Active Member of Clubs or Organizations: Not on file  . Attends Banker Meetings: Not on file  . Marital Status: Not on file  Intimate Partner Violence:   . Fear of Current or Ex-Partner: Not on file  . Emotionally Abused: Not on file  . Physically Abused: Not on file  . Sexually Abused: Not on file     Current Outpatient Medications:  .  amLODipine (NORVASC) 10 MG tablet, Take 1 tablet (10 mg total) by mouth daily., Disp: 90 tablet, Rfl: 1 .  busPIRone (BUSPAR) 5 MG tablet, Take 1-2 tablets (5-10 mg total) by mouth 3 (three) times daily., Disp: 120 tablet, Rfl: 0 .  esomeprazole (NEXIUM) 40 MG capsule, TAKE ONE CAPSULE BY MOUTH DAILY, Disp: 30 capsule, Rfl: 0 .  metoprolol (TOPROL-XL) 100 MG 24 hr tablet, Take 1 tablet (100 mg total) by mouth daily., Disp: 90 tablet, Rfl: 1 .  venlafaxine XR (EFFEXOR-XR) 150 MG 24 hr capsule, TAKE ONE CAPSULE BY MOUTH DAILY WITH BREAKFAST, Disp: 30 capsule, Rfl: 0 .  nicotine polacrilex (NICORETTE) 2 MG gum, Take 1 each (2 mg total) by mouth as needed for smoking cessation. (Patient not taking: Reported on 07/31/2019), Disp: 100 tablet, Rfl: 0  Allergies  Allergen Reactions  . Ace Inhibitors Anaphylaxis  . Captopril Anaphylaxis  . Enalapril Anaphylaxis  . Fosinopril  Anaphylaxis  . Lisinopril Anaphylaxis  . Ramipril Anaphylaxis    I personally reviewed active problem list, medication list, allergies with the patient/caregiver today.   ROS  Ten systems reviewed and is negative except as mentioned in HPI  Objective  Virtual encounter, vitals not obtained.  There is no height or weight on file to calculate BMI.  Physical Exam  Pulmonary/Chest: Effort normal. No respiratory distress. Speaking in complete sentences Neurological: Pt is alert and oriented to person, place, and time. Speech is normal Psychiatric: Patient has a normal mood and affect. behavior is normal. Judgment and thought content normal.  No results found for this or  any previous visit (from the past 72 hour(s)).  PHQ2/9: Depression screen [facility] 2/9 11/02/2019 07/31/2019 03/22/2019 11/04/2017 01/14/2017  Decreased Interest 0 1 - 0 0  Down, Depressed, Hopeless 0 2 1 0 1  PHQ - 2 Score 0 3 1 0 1  Altered sleeping 0 3 1 - -  Tired, decreased energy 0 2 0 - -  Change in appetite 0 0 0 - -  Feeling bad or failure about yourself  0 0 0 - -  Trouble concentrating 0 1 0 - -  Moving slowly or fidgety/restless 0 0 0 - -  Suicidal thoughts 0 0 0 - -  PHQ-9 Score 0 9 2 - -  Difficult doing work/chores Not difficult at all Somewhat difficult Somewhat difficult - -   PHQ-2/9 Result is negative.    Fall Risk: Fall Risk  11/02/2019 03/22/2019 01/20/2019 11/04/2017 01/14/2017  Falls in the past year? 0 0 0 Yes No  Number falls in past yr: 0 0 - 1 -  Injury with Fall? 0 0 - No -  Follow up Falls evaluation completed - - - -    Assessment & Plan  1. Essential hypertension - Continue current regimen, asymptomatic and stable.  2. Recurrent major depressive disorder, in partial remission ([place]) 3. Anxiety 4. Alcohol abuse, episodic - Continue effexor 150mg .  Stopped buspar due to SE's of GI upset.  Recommend continued work on ETOH cessation - he is drinking a few beers every 2-3 weeks.  With  his history of abuse, I strongly recommend complete cessation.  He is attending [facility] meetings virtually weekly.   I discussed the assessment and treatment plan with the patient. The patient was provided an opportunity to ask questions and all were answered. The patient agreed with the plan and demonstrated an understanding of the instructions.   The patient was advised to call back or seek an in-person evaluation if the symptoms worsen or if the condition fails to improve as anticipated.  I provided 16 minutes of non-face-to-face time during this encounter.  Hubbard Hartshorn, FNP

## 2019-12-31 ENCOUNTER — Other Ambulatory Visit: Payer: Self-pay | Admitting: Family Medicine

## 2019-12-31 DIAGNOSIS — F1010 Alcohol abuse, uncomplicated: Secondary | ICD-10-CM

## 2019-12-31 DIAGNOSIS — F3341 Major depressive disorder, recurrent, in partial remission: Secondary | ICD-10-CM

## 2019-12-31 DIAGNOSIS — F419 Anxiety disorder, unspecified: Secondary | ICD-10-CM

## 2020-01-01 NOTE — Telephone Encounter (Signed)
Requested Prescriptions  Pending Prescriptions Disp Refills  . metoprolol succinate (TOPROL-XL) 100 MG 24 hr tablet [Pharmacy Med Name: METOPROLOL SUCC ER 100 MG TAB] 30 tablet 1    Sig: TAKE ONE TABLET BY MOUTH DAILY     Cardiovascular:  Beta Blockers Failed - 12/31/2019 10:30 AM      Failed - Last BP in normal range    BP Readings from Last 1 Encounters:  07/31/19 (!) 163/99         Passed - Last Heart Rate in normal range    Pulse Readings from Last 1 Encounters:  07/31/19 81         Passed - Valid encounter within last 6 months    Recent Outpatient Visits          2 months ago Essential hypertension   [facility] Doren Custard, FNP   5 months ago Substance induced mood disorder [facility] [place])   [facility] Danelle Berry, PA-C   9 months ago Anxiety   [facility] Doren Custard, [place]   11 months ago Essential hypertension   [name], Janit Bern, MD   2 years ago Essential hypertension   [name], Janit Bern, MD      Future Appointments            In 2 months Danelle Berry, PA-C [facility], [facility]

## 2020-01-14 ENCOUNTER — Other Ambulatory Visit: Payer: Self-pay | Admitting: Family Medicine

## 2020-01-14 DIAGNOSIS — F1010 Alcohol abuse, uncomplicated: Secondary | ICD-10-CM

## 2020-01-14 DIAGNOSIS — F3341 Major depressive disorder, recurrent, in partial remission: Secondary | ICD-10-CM

## 2020-01-14 DIAGNOSIS — F419 Anxiety disorder, unspecified: Secondary | ICD-10-CM

## 2020-01-29 ENCOUNTER — Other Ambulatory Visit: Payer: Self-pay

## 2020-01-29 MED ORDER — AMLODIPINE BESYLATE 10 MG PO TABS: 10.0000 mg | ORAL_TABLET | Freq: Every day | ORAL | 0 refills | Status: DC

## 2020-01-30 ENCOUNTER — Other Ambulatory Visit: Payer: Self-pay | Admitting: Family Medicine

## 2020-01-30 MED ORDER — AMLODIPINE BESYLATE 10 MG PO TABS: 10.0000 mg | ORAL_TABLET | Freq: Every day | ORAL | 3 refills | Status: DC

## 2020-01-30 NOTE — Addendum Note (Signed)
Addended by: Danelle Berry on: 01/30/2020 04:15 PM   Modules accepted: Orders

## 2020-02-19 ENCOUNTER — Other Ambulatory Visit: Payer: Self-pay | Admitting: Family Medicine

## 2020-02-19 DIAGNOSIS — F3341 Major depressive disorder, recurrent, in partial remission: Secondary | ICD-10-CM

## 2020-02-19 DIAGNOSIS — F419 Anxiety disorder, unspecified: Secondary | ICD-10-CM

## 2020-02-19 DIAGNOSIS — F1010 Alcohol abuse, uncomplicated: Secondary | ICD-10-CM

## 2020-02-19 NOTE — Telephone Encounter (Signed)
Patient states he was advised by [facility] that he has to have these refilled before Wednesday morning as well as an additional refill as well as he is checking in for recovery. He states after program he will set up appointment to come into see PCP

## 2020-02-20 NOTE — Telephone Encounter (Signed)
Patient calling again about request. He states he is leaving tomorrow morning at [facility] and needs to pick up meds before he goes.

## 2020-03-01 ENCOUNTER — Ambulatory Visit: Payer: 59 | Admitting: Family Medicine

## 2020-05-14 NOTE — Progress Notes (Signed)
Patient ID: [name] White, male    DOB: 12-28-1970, 49 y.o.   MRN: [id number]  PCP: Danelle Berry, PA-C  Chief Complaint  Patient presents with  . Medication Refill    would like to discuss a different medication for mood    Subjective:   [name] White is a 49 y.o. male, presents to clinic with CC of the following:  Chief Complaint  Patient presents with  . Medication Refill    would like to discuss a different medication for mood    HPI:  Patient is a 49 year old male, patient of Danelle Berry Last follow-up was in January 2021 with Maurice Small. Follows up today, needs a medication refill  Anxiety and Depression + ETOH Abuse: He has had prior inpatient treatment for alcohol withdrawal, and now is 90 days free from alcohol.  He notes he is strongly committed to remaining free from alcohol, and physically has felt very good in the recent past  last visit he noted he stopped Buspirone because he felt like it gave him diarrhea. He felt like he was doing well on just Venlafaxine 150mg , although not sure if working as well presently, but notes staying in a good mood now all the time, and after discussion today, felt best to continue. Noted he does have support in his alcohol cessation, with AA and family support.  HTN: Has a history of difficult to control blood pressure, with some compliance concerns noted in the past Medication regimen-Toprol XL 100mg  and amlodipine 10mg .  He did not tolerate hydralazine in the past due to diarrhea, and reduced his metoprolol dose in the past because it makes him feel bad. Does not check blood pressures at home Notes blood pressure has been much better since stopped drinking BP Readings from Last 3 Encounters:  05/15/20 (!) 142/82  07/31/19 (!) 163/99  04/07/19 (!) 154/89   Denies chest pain, palpitations, shortness of breath, increasing lower extremity edema, increasing headaches, vision changes.     Last metabolic panel Lab Results   Component Value Date   GLUCOSE 117 (H) 04/06/2019   NA 142 04/06/2019   K 3.5 04/06/2019   CL 105 04/06/2019   CO2 22 04/06/2019   BUN 15 04/06/2019   CREATININE 0.79 04/06/2019   GFRNONAA >60 04/06/2019   GFRAA >60 04/06/2019   CALCIUM 9.0 04/06/2019   PHOS 7.4 (H) 03/24/2018   PROT 8.5 (H) 04/06/2019   ALBUMIN 4.8 04/06/2019   LABGLOB 2.9 03/23/2018   AGRATIO 1.2 03/23/2018   BILITOT 0.4 04/06/2019   ALKPHOS 96 04/06/2019   AST 29 04/06/2019   ALT 32 04/06/2019   ANIONGAP 15 04/06/2019   Tobacco- 1 PPD, since age [age]  06/07/2019 out of [employer] truck 2 weeks ago, seen at [facility], rib contusion and otitis externa diagnosed.  The rib contusion is improving with breathing and hurts still, more when first gets up in the morning, in the left lower rib cage region.  The otitis externa has not improved much as he has not filled the drops to take for this as his insurance did not kick in until today.  He plans to get the drops after our visit here, and strongly recommended that.  He has been just applying alcohol drops to the left ear.  Patient Active Problem List   Diagnosis Date Noted  . Substance induced mood disorder (HCC) 04/07/2019  . Abnormal nuclear stress test 12/31/2015  . Snoring 11/06/2015  . Elevated liver function tests  05/19/2015  . Fatty liver 05/19/2015  . Elevated serum GGT level 05/17/2015  . IFG (impaired fasting glucose)   . Hyperlipidemia   . Essential hypertension   . Depression   . Anxiety   . GERD (gastroesophageal reflux disease)   . Overweight   . Alcohol abuse, episodic   . 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      Current Outpatient Medications:  .  amLODipine (NORVASC) 10 MG tablet, Take 1 tablet (10 mg total) by mouth daily., Disp: 90 tablet, Rfl: 3 .  metoprolol succinate (TOPROL-XL) 100 MG 24 hr tablet, TAKE ONE TABLET BY MOUTH DAILY, Disp: 90 tablet, Rfl: 0 .  venlafaxine XR (EFFEXOR-XR) 150 MG 24 hr capsule, TAKE ONE CAPSULE BY MOUTH  DAILY WITH BREAKFAST, Disp: 90 capsule, Rfl: 0 .  esomeprazole (NEXIUM) 40 MG capsule, TAKE ONE CAPSULE BY MOUTH DAILY (Patient not taking: Reported on 05/15/2020), Disp: 30 capsule, Rfl: 0   Allergies  Allergen Reactions  . Ace Inhibitors Anaphylaxis  . Captopril Anaphylaxis  . Enalapril Anaphylaxis  . Fosinopril Anaphylaxis  . Lisinopril Anaphylaxis  . Ramipril Anaphylaxis     Past Surgical History:  Procedure Laterality Date  . CARDIAC CATHETERIZATION N/A 01/02/2016   Procedure: Left Heart Cath and Coronary Angiography;  Surgeon: Marykay Lexavid W Harding, MD;  [facility];  Service: Cardiovascular;  Laterality: N/A;  . CARPAL TUNNEL RELEASE Bilateral    right x 2, left x 1  . DIALYSIS/PERMA CATHETER INSERTION N/A 03/23/2018   Procedure: DIALYSIS/PERMA CATHETER INSERTION;  Surgeon: Renford DillsSchnier, Gregory G, MD;  Location: [facility] INVASIVE CV LAB;  Service: Cardiovascular;  Laterality: N/A;  . TESTICLE TORSION REDUCTION    . TOTAL HIP ARTHROPLASTY Left 01/30/2014   Procedure: LEFT TOTAL HIP ARTHROPLASTY ANTERIOR APPROACH;  Surgeon: Shelda PalMatthew D Olin, MD;  Location: [facility] ORS;  Service: Orthopedics;  Laterality: Left;  Marland Kitchen. VASECTOMY  1995  . VASECTOMY REVERSAL  1997     Family History  Problem Relation Age of Onset  . Hypertension Mother   . Diabetes Mother   . Hypertension Father   . Stroke Father   . Cancer Neg Hx   . COPD Neg Hx   . Heart disease Neg Hx      Social History   Tobacco Use  . Smoking status: Current Every Day Smoker    Packs/day: 1.00    Years: 30.00    Pack years: 30.00    Types: Cigarettes  . Smokeless tobacco: Never Used  . Tobacco comment: discussed options   Substance Use Topics  . Alcohol use: Not Currently    Comment: d/c ETOH 10 d ago 07/21/2019    With staff assistance, above reviewed with the patient today.  ROS: As per HPI, otherwise no specific complaints on a limited and focused system review   No results found for this or any previous  visit (from the past 72 hour(s)).   PHQ2/9: [facility] 05/15/2020 11/02/2019 07/31/2019 03/22/2019 11/04/2017  Decreased Interest 0 0 1 - 0  Down, Depressed, Hopeless 0 0 2 1 0  PHQ - 2 Score 0 0 3 1 0  Altered sleeping 2 0 3 1 -  Tired, decreased energy 0 0 2 0 -  Change in appetite 0 0 0 0 -  Feeling bad or failure about yourself  0 0 0 0 -  Trouble concentrating 0 0 1 0 -  Moving slowly or fidgety/restless 0 0 0 0 -  Suicidal thoughts  0 0 0 0 -  PHQ-9 Score 2 0 9 2 -  Difficult doing work/chores Not difficult at all Not difficult at all Somewhat difficult Somewhat difficult -   PHQ-2/9 Result reviewed  Fall Risk: Fall Risk  05/15/2020 11/02/2019 03/22/2019 01/20/2019 11/04/2017  Falls in the past year? 1 0 0 0 Yes  Number falls in past yr: 0 0 0 - 1  Injury with Fall? 1 0 0 - No  Follow up - Falls evaluation completed - - -      Objective:   Vitals:   05/15/20 1456 05/15/20 1457  BP: (!) 142/86 (!) 142/82  Pulse: 91   Resp: 16   Temp: 98.3 F (36.8 C)   TempSrc: Temporal   SpO2: 99%   Weight: 183 lb 12.8 oz (83.4 kg)   Height:  (1.753 m)     Body mass index is 27.14 kg/m.  Physical Exam   NAD, masked HEENT - [place]/AT, sclera anicteric, PERRL, EOMI, conj - non-inj'ed, tender tugging on his left ear lobe with the canal inflamed, and mildly erythematous.  Difficult to view the TM, the right TM and canal are clear with no tenderness tugging on the lobe pharynx clear Neck - supple, no adenopathy, carotids 2+ and = without bruits bilat Car - RRR without m/g/r Pulm- RR and effort normal at rest, CTA without wheeze or rales Chest-still tender with palpation over the left anterior lower rib cage towards the axillary line, with no major rib disruption noted, no bruising, Abd - soft, NT diffusely, ND, BS+,  no masses,  Back - no CVA tenderness Ext -had an ankle brace on the left ankle from a recent twist at work, minimally swollen compared to the right ankle,  good motion of that ankle with good strength on testing with dorsi and plantar flexion and no pain with testing strength, Neuro/psychiatric - affect was not flat, appropriate with conversation  Alert and oriented  Grossly non-focal - good strength on testing extremities, sensation intact to LT in distal extremities, DTRs 2+ and equal in the patella, Romberg negative, no pronator drift, good finger-to-nose, good balance on 1 foot, gait normal with good tandem walk  Speech  normal   Results for orders placed or performed during the hospital encounter of 04/06/19  SARS Coronavirus 2 (CEPHEID - Performed in [facility] hospital lab), [facility] Order   Specimen: Nasopharyngeal Swab  Result Value Ref Range   SARS Coronavirus 2 NEGATIVE NEGATIVE  Comprehensive metabolic panel  Result Value Ref Range   Sodium 142 135 - 145 mmol/L   Potassium 3.5 3.5 - 5.1 mmol/L   Chloride 105 98 - 111 mmol/L   CO2 22 22 - 32 mmol/L   Glucose, Bld 117 (H) 70 - 99 mg/dL   BUN 15 6 - 20 mg/dL   Creatinine, Ser 1.61 0.61 - 1.24 mg/dL   Calcium 9.0 8.9 - 09.6 mg/dL   Total Protein 8.5 (H) 6.5 - 8.1 g/dL   Albumin 4.8 3.5 - 5.0 g/dL   AST 29 15 - 41 U/L   ALT 32 0 - 44 U/L   Alkaline Phosphatase 96 38 - 126 U/L   Total Bilirubin 0.4 0.3 - 1.2 mg/dL   GFR calc non Af Amer >60 >60 mL/min   GFR calc Af Amer >60 >60 mL/min   Anion gap 15 5 - 15  Ethanol  Result Value Ref Range   Alcohol, Ethyl (B) 346 (HH) <10 mg/dL  Salicylate level  Result  Value Ref Range   Salicylate Lvl <7.0 2.8 - 30.0 mg/dL  Acetaminophen level  Result Value Ref Range   Acetaminophen (Tylenol), Serum <10 (L) 10 - 30 ug/mL  cbc  Result Value Ref Range   WBC 9.2 4.0 - 10.5 K/uL   RBC 5.75 4.22 - 5.81 MIL/uL   Hemoglobin 17.3 (H) 13.0 - 17.0 g/dL   HCT 62.3 39 - 52 %   MCV 87.5 80.0 - 100.0 fL   MCH 30.1 26.0 - 34.0 pg   MCHC 34.4 30.0 - 36.0 g/dL   RDW 76.2 83.1 - 51.7 %   Platelets 349 150 - 400 K/uL   nRBC 0.0 0.0 - 0.2 %  Rapid  urine drug screen (hospital performed)  Result Value Ref Range   Opiates NONE DETECTED NONE DETECTED   Cocaine NONE DETECTED NONE DETECTED   Benzodiazepines NONE DETECTED NONE DETECTED   Amphetamines NONE DETECTED NONE DETECTED   Tetrahydrocannabinol NONE DETECTED NONE DETECTED   Barbiturates NONE DETECTED NONE DETECTED       Assessment & Plan:   1. Recurrent major depressive disorder, in partial remission (HCC)/mood disorder 2. Anxiety He has been on the Effexor product in the recent past, and noted it was helpful previously.  He is unclear how much it is helping at this time.  Discussed concerns with trying to stop this medicine or change medicines presently, as it is likely helping some and he agreed.  Felt best to continue this presently and reevaluate as needed over time. - venlafaxine XR (EFFEXOR-XR) 150 MG 24 hr capsule; Take one capsule daily  Dispense: 90 capsule; Refill: 1  3. Alcohol abuse, episodic After inpatient treatment, has been free of alcohol now for 90 days, and is very committed to remaining free from alcohol.  Has support system set up to help, and emphasized the importance of this over time.  4. Essential hypertension Blood pressure was borderline systolic today, although has been better since stopping the alcohol, and hesitant adding medicines at this point. We will continue his current medication regimen and refills given. Would like to follow-up again in a 2 to 26-month timeframe to make sure his blood pressure is controlled, and he was in agreement with this. - amLODipine (NORVASC) 10 MG tablet; Take 1 tablet (10 mg total) by mouth daily.  Dispense: 90 tablet; Refill: 3 - metoprolol succinate (TOPROL-XL) 100 MG 24 hr tablet; Take 1 tablet (100 mg total) by mouth daily. Take with or immediately following a meal.  Dispense: 90 tablet; Refill: 1  5. Substance induced mood disorder (HCC) As noted above  6. Tobacco dependence Discussed the goal of tobacco  cessation in the future as well, although he noted he is trying to handle one at a time, and agree with that presently.  Over time, hope to work away from tobacco use, and he was understanding of that.  7. Gastroesophageal reflux disease without esophagitis Notes the reflux has been better since stopping the alcohol, and takes the Nexium product daily.  Discussed some concerns with long-term use of PPIs, and he was aware them as his wife was on a long-term PPI before stopping recently. Over time, noted trying to lessen use of this, with reflux precautions emphasized, and information provided in the AVS on that as well. Did refill this today for him - esomeprazole (NEXIUM) 40 MG capsule; Take 1 capsule (40 mg total) by mouth daily.  Dispense: 30 capsule; Refill: 3  History of impaired fasting glucose, hyperlipidemia, and  fatty liver noted in his past Noted this in his history, and discussed getting labs as it has been a while since he had labs checked.  He noted he can return for a morning appointment, and asked that he return fasting, and we will check labs at that point, and the goal is being free from alcohol still at that time. Suggested a follow-up in a 2 to 39-month timeframe, and he asked about following up in 3 months in the morning and felt was reasonable.  Follow-up sooner as needed.    Jamelle Haring, MD 05/15/20 3:14 PM

## 2020-05-15 ENCOUNTER — Encounter: Payer: Self-pay | Admitting: Internal Medicine

## 2020-05-15 ENCOUNTER — Other Ambulatory Visit: Payer: Self-pay

## 2020-05-15 ENCOUNTER — Ambulatory Visit (INDEPENDENT_AMBULATORY_CARE_PROVIDER_SITE_OTHER): Payer: No Typology Code available for payment source | Admitting: Internal Medicine

## 2020-05-15 VITALS — BP 142/82 | HR 91 | Temp 98.3°F | Resp 16 | Ht 69.0 in | Wt 183.8 lb

## 2020-05-15 DIAGNOSIS — K219 Gastro-esophageal reflux disease without esophagitis: Secondary | ICD-10-CM

## 2020-05-15 DIAGNOSIS — F3341 Major depressive disorder, recurrent, in partial remission: Secondary | ICD-10-CM | POA: Diagnosis not present

## 2020-05-15 DIAGNOSIS — F1010 Alcohol abuse, uncomplicated: Secondary | ICD-10-CM

## 2020-05-15 DIAGNOSIS — F419 Anxiety disorder, unspecified: Secondary | ICD-10-CM | POA: Diagnosis not present

## 2020-05-15 DIAGNOSIS — F1994 Other psychoactive substance use, unspecified with psychoactive substance-induced mood disorder: Secondary | ICD-10-CM

## 2020-05-15 DIAGNOSIS — F17200 Nicotine dependence, unspecified, uncomplicated: Secondary | ICD-10-CM | POA: Insufficient documentation

## 2020-05-15 DIAGNOSIS — I10 Essential (primary) hypertension: Secondary | ICD-10-CM | POA: Diagnosis not present

## 2020-05-15 MED ORDER — VENLAFAXINE HCL ER 150 MG PO CP24: ORAL_CAPSULE | ORAL | 1 refills | Status: DC

## 2020-05-15 MED ORDER — ESOMEPRAZOLE MAGNESIUM 40 MG PO CPDR: 40.0000 mg | DELAYED_RELEASE_CAPSULE | Freq: Every day | ORAL | 3 refills | Status: DC

## 2020-05-15 MED ORDER — AMLODIPINE BESYLATE 10 MG PO TABS: 10.0000 mg | ORAL_TABLET | Freq: Every day | ORAL | 3 refills | Status: DC

## 2020-05-15 MED ORDER — METOPROLOL SUCCINATE ER 100 MG PO TB24: 100.0000 mg | ORAL_TABLET | Freq: Every day | ORAL | 1 refills | Status: DC

## 2020-05-15 NOTE — Patient Instructions (Signed)

## 2020-05-16 ENCOUNTER — Other Ambulatory Visit: Payer: Self-pay | Admitting: Family Medicine

## 2020-05-16 DIAGNOSIS — I10 Essential (primary) hypertension: Secondary | ICD-10-CM

## 2020-05-30 ENCOUNTER — Ambulatory Visit: Payer: 59 | Admitting: Internal Medicine

## 2020-08-15 ENCOUNTER — Ambulatory Visit: Payer: No Typology Code available for payment source | Admitting: Family Medicine

## 2020-08-19 ENCOUNTER — Ambulatory Visit (INDEPENDENT_AMBULATORY_CARE_PROVIDER_SITE_OTHER): Payer: No Typology Code available for payment source | Admitting: Family Medicine

## 2020-08-19 ENCOUNTER — Encounter: Payer: Self-pay | Admitting: Family Medicine

## 2020-08-19 ENCOUNTER — Other Ambulatory Visit: Payer: Self-pay

## 2020-08-19 VITALS — BP 138/90 | HR 76 | Temp 98.0°F | Resp 16 | Ht 69.0 in | Wt 187.3 lb

## 2020-08-19 DIAGNOSIS — Z5181 Encounter for therapeutic drug level monitoring: Secondary | ICD-10-CM | POA: Diagnosis not present

## 2020-08-19 DIAGNOSIS — K219 Gastro-esophageal reflux disease without esophagitis: Secondary | ICD-10-CM

## 2020-08-19 DIAGNOSIS — Z1211 Encounter for screening for malignant neoplasm of colon: Secondary | ICD-10-CM

## 2020-08-19 DIAGNOSIS — I10 Essential (primary) hypertension: Secondary | ICD-10-CM | POA: Diagnosis not present

## 2020-08-19 DIAGNOSIS — F10188 Alcohol abuse with other alcohol-induced disorder: Secondary | ICD-10-CM

## 2020-08-19 DIAGNOSIS — Z716 Tobacco abuse counseling: Secondary | ICD-10-CM

## 2020-08-19 DIAGNOSIS — E785 Hyperlipidemia, unspecified: Secondary | ICD-10-CM | POA: Diagnosis not present

## 2020-08-19 DIAGNOSIS — F17200 Nicotine dependence, unspecified, uncomplicated: Secondary | ICD-10-CM

## 2020-08-19 DIAGNOSIS — R7303 Prediabetes: Secondary | ICD-10-CM

## 2020-08-19 DIAGNOSIS — Z1159 Encounter for screening for other viral diseases: Secondary | ICD-10-CM

## 2020-08-19 MED ORDER — ESOMEPRAZOLE MAGNESIUM 40 MG PO CPDR: 40.0000 mg | DELAYED_RELEASE_CAPSULE | Freq: Every day | ORAL | 3 refills | Status: DC

## 2020-08-19 MED ORDER — CHANTIX STARTING MONTH PAK 0.5 MG X 11 & 1 MG X 42 PO TABS: ORAL_TABLET | ORAL | 0 refills | Status: DC

## 2020-08-19 NOTE — Patient Instructions (Signed)
Varenicline oral tablets What is this medicine? VARENICLINE (var e NI kleen) is used to help people quit smoking. It is used with a patient support program recommended by your physician. This medicine may be used for other purposes; ask your health care provider or pharmacist if you have questions. COMMON BRAND NAME(S): Chantix What should I tell my health care provider before I take this medicine? They need to know if you have any of these conditions:  heart disease  if you often drink alcohol  kidney disease  mental illness  on hemodialysis  seizures  history of stroke  suicidal thoughts, plans, or attempt; a previous suicide attempt by you or a family member  an unusual or allergic reaction to varenicline, other medicines, foods, dyes, or preservatives  pregnant or trying to get pregnant  breast-feeding How should I use this medicine? Take this medicine by mouth after eating. Take with a full glass of water. Follow the directions on the prescription label. Take your doses at regular intervals. Do not take your medicine more often than directed. There are 3 ways you can use this medicine to help you quit smoking; talk to your health care professional to decide which plan is right for you: 1) you can choose a quit date and start this medicine 1 week before the quit date, or, 2) you can start taking this medicine before you choose a quit date, and then pick a quit date between day 8 and 35 days of treatment, or, 3) if you are not sure that you are able or willing to quit smoking right away, start taking this medicine and slowly decrease the amount you smoke as directed by your health care professional with the goal of being cigarette-free by week 12 of treatment. Stick to your plan; ask about support groups or other ways to help you remain cigarette-free. If you are motivated to quit smoking and did not succeed during a previous attempt with this medicine for reasons other than  side effects, or if you returned to smoking after this treatment, speak with your health care professional about whether another course of this medicine may be right for you. A special MedGuide will be given to you by the pharmacist with each prescription and refill. Be sure to read this information carefully each time. Talk to your pediatrician regarding the use of this medicine in children. This medicine is not approved for use in children. Overdosage: If you think you have taken too much of this medicine contact a poison control center or emergency room at once. NOTE: This medicine is only for you. Do not share this medicine with others. What if I miss a dose? If you miss a dose, take it as soon as you can. If it is almost time for your next dose, take only that dose. Do not take double or extra doses. What may interact with this medicine?  alcohol  insulin  other medicines used to help people quit smoking  theophylline  warfarin This list may not describe all possible interactions. Give your health care provider a list of all the medicines, herbs, non-prescription drugs, or dietary supplements you use. Also tell them if you smoke, drink alcohol, or use illegal drugs. Some items may interact with your medicine. What should I watch for while using this medicine? It is okay if you do not succeed at your attempt to quit and have a cigarette. You can still continue your quit attempt and keep using this medicine as directed.   Just throw away your cigarettes and get back to your quit plan. Talk to your health care provider before using other treatments to quit smoking. Using this medicine with other treatments to quit smoking may increase the risk for side effects compared to using a treatment alone. You may get drowsy or dizzy. Do not drive, use machinery, or do anything that needs mental alertness until you know how this medicine affects you. Do not stand or sit up quickly, especially if you are  an older patient. This reduces the risk of dizzy or fainting spells. Decrease the number of alcoholic beverages that you drink during treatment with this medicine until you know if this medicine affects your ability to tolerate alcohol. Some people have experienced increased drunkenness (intoxication), unusual or sometimes aggressive behavior, or no memory of things that have happened (amnesia) during treatment with this medicine. Sleepwalking can happen during treatment with this medicine, and can sometimes lead to behavior that is harmful to you, other people, or property. Stop taking this medicine and tell your doctor if you start sleepwalking or have other unusual sleep-related activity. After taking this medicine, you may get up out of bed and do an activity that you do not know you are doing. The next morning, you may have no memory of this. Activities include driving a car ("sleep-driving"), making and eating food, talking on the phone, sexual activity, and sleep-walking. Serious injuries have occurred. Stop the medicine and call your doctor right away if you find out you have done any of these activities. Do not take this medicine if you have used alcohol that evening. Do not take it if you have taken another medicine for sleep. The risk of doing these sleep-related activities is higher. Patients and their families should watch out for new or worsening depression or thoughts of suicide. Also watch out for sudden changes in feelings such as feeling anxious, agitated, panicky, irritable, hostile, aggressive, impulsive, severely restless, overly excited and hyperactive, or not being able to sleep. If this happens, call your health care professional. If you have diabetes and you quit smoking, the effects of insulin may be increased and you may need to reduce your insulin dose. Check with your doctor or health care professional about how you should adjust your insulin dose. What side effects may I notice  from receiving this medicine? Side effects that you should report to your doctor or health care professional as soon as possible:  allergic reactions like skin rash, itching or hives, swelling of the face, lips, tongue, or throat  acting aggressive, being angry or violent, or acting on dangerous impulses  breathing problems  changes in emotions or moods  chest pain or chest tightness  feeling faint or lightheaded, falls  hallucination, loss of contact with reality  mouth sores  redness, blistering, peeling or loosening of the skin, including inside the mouth  signs and symptoms of a stroke like changes in vision; confusion; trouble speaking or understanding; severe headaches; sudden numbness or weakness of the face, arm or leg; trouble walking; dizziness; loss of balance or coordination  seizures  sleepwalking  suicidal thoughts or other mood changes Side effects that usually do not require medical attention (report to your doctor or health care professional if they continue or are bothersome):  constipation  gas  headache  nausea, vomiting  strange dreams  trouble sleeping This list may not describe all possible side effects. Call your doctor for medical advice about side effects. You may report side   effects to FDA at 1-800-FDA-1088. Where should I keep my medicine? Keep out of the reach of children. Store at room temperature between 15 and 30 degrees C (59 and 86 degrees F). Throw away any unused medicine after the expiration date. NOTE: This sheet is a summary. It may not cover all possible information. If you have questions about this medicine, talk to your doctor, pharmacist, or health care provider.  2020 Elsevier/Gold Standard (2018-09-09 14:27:36)     Steps to Quit Smoking Smoking tobacco is the leading cause of preventable death. It can affect almost every organ in the body. Smoking puts you and people around you at risk for many serious, long-lasting  (chronic) diseases. Quitting smoking can be hard, but it is one of the best things that you can do for your health. It is never too late to quit. How do I get ready to quit? When you decide to quit smoking, make a plan to help you succeed. Before you quit:  Pick a date to quit. Set a date within the next 2 weeks to give you time to prepare.  Write down the reasons why you are quitting. Keep this list in places where you will see it often.  Tell your family, friends, and co-workers that you are quitting. Their support is important.  Talk with your doctor about the choices that may help you quit.  Find out if your health insurance will pay for these treatments.  Know the people, places, things, and activities that make you want to smoke (triggers). Avoid them. What first steps can I take to quit smoking?  Throw away all cigarettes at home, at work, and in your car.  Throw away the things that you use when you smoke, such as ashtrays and lighters.  Clean your car. Make sure to empty the ashtray.  Clean your home, including curtains and carpets. What can I do to help me quit smoking? Talk with your doctor about taking medicines and seeing a counselor at the same time. You are more likely to succeed when you do both.  If you are pregnant or breastfeeding, talk with your doctor about counseling or other ways to quit smoking. Do not take medicine to help you quit smoking unless your doctor tells you to do so. To quit smoking: Quit right away  Quit smoking totally, instead of slowly cutting back on how much you smoke over a period of time.  Go to counseling. You are more likely to quit if you go to counseling sessions regularly. Take medicine You may take medicines to help you quit. Some medicines need a prescription, and some you can buy over-the-counter. Some medicines may contain a drug called nicotine to replace the nicotine in cigarettes. Medicines may:  Help you to stop having the  desire to smoke (cravings).  Help to stop the problems that come when you stop smoking (withdrawal symptoms). Your doctor may ask you to use:  Nicotine patches, gum, or lozenges.  Nicotine inhalers or sprays.  Non-nicotine medicine that is taken by mouth. Find resources Find resources and other ways to help you quit smoking and remain smoke-free after you quit. These resources are most helpful when you use them often. They include:  Online chats with a Veterinary surgeon.  Phone quitlines.  Printed Materials engineer.  Support groups or group counseling.  Text messaging programs.  Mobile phone apps. Use apps on your mobile phone or tablet that can help you stick to your quit plan. There are many  free apps for mobile phones and tablets as well as websites. Examples include Quit Guide from the [employer] and smokefree.gov  What things can I do to make it easier to quit?   Talk to your family and friends. Ask them to support and encourage you.  Call a phone quitline (1-800-QUIT-NOW), reach out to support groups, or work with a Veterinary surgeon.  Ask people who smoke to not smoke around you.  Avoid places that make you want to smoke, such as: ? Bars. ? Parties. ? Smoke-break areas at work.  Spend time with people who do not smoke.  Lower the stress in your life. Stress can make you want to smoke. Try these things to help your stress: ? Getting regular exercise. ? Doing deep-breathing exercises. ? Doing yoga. ? Meditating. ? Doing a body scan. To do this, close your eyes, focus on one area of your body at a time from head to toe. Notice which parts of your body are tense. Try to relax the muscles in those areas. How will I feel when I quit smoking? Day 1 to 3 weeks Within the first 24 hours, you may start to have some problems that come from quitting tobacco. These problems are very bad 2-3 days after you quit, but they do not often last for more than 2-3 weeks. You may get these  symptoms:  Mood swings.  Feeling restless, nervous, angry, or annoyed.  Trouble concentrating.  Dizziness.  Strong desire for high-sugar foods and nicotine.  Weight gain.  Trouble pooping (constipation).  Feeling like you may vomit (nausea).  Coughing or a sore throat.  Changes in how the medicines that you take for other issues work in your body.  Depression.  Trouble sleeping (insomnia). Week 3 and afterward After the first 2-3 weeks of quitting, you may start to notice more positive results, such as:  Better sense of smell and taste.  Less coughing and sore throat.  Slower heart rate.  Lower blood pressure.  Clearer skin.  Better breathing.  Fewer sick days. Quitting smoking can be hard. Do not give up if you fail the first time. Some people need to try a few times before they succeed. Do your best to stick to your quit plan, and talk with your doctor if you have any questions or concerns. Summary  Smoking tobacco is the leading cause of preventable death. Quitting smoking can be hard, but it is one of the best things that you can do for your health.  When you decide to quit smoking, make a plan to help you succeed.  Quit smoking right away, not slowly over a period of time.  When you start quitting, seek help from your doctor, family, or friends. This information is not intended to replace advice given to you by your health care provider. Make sure you discuss any questions you have with your health care provider. Document Revised: 06/16/2019 Document Reviewed: 12/10/2018 Elsevier Patient Education  2020 [employer].

## 2020-08-19 NOTE — Progress Notes (Signed)
Name: [name] White   MRN: [id number]    DOB: 1971/05/04   Date:08/19/2020       Progress Note  Chief Complaint  Patient presents with  . Hypertension    3 month follow up     Subjective:   [name] White is a 49 y.o. male, presents to clinic for routine f/up  New to me  Hypertension:  Currently managed on metoprolol and amlodipine he has multiple drug allergies to ACE inhibitors Pt reports good med compliance and denies any SE.   Blood pressure today is fairly well controlled- improved from past readings, though I would like to see it below 130/80, he did smoke a cigarette immediately before his appointment today BP Readings from Last 3 Encounters:  08/19/20 138/90  05/15/20 (!) 142/82  07/31/19 (!) 163/99   Pt denies CP, SOB, exertional sx, LE edema, palpitation, Ha's, visual disturbances, lightheadedness, hypotension, syncope.   GERD on nexium -symptoms are well controlled   Hx of mood disorder, anxiety and depression ETOH abuse -he has been sober after completing rehab program and he is active with AA On venlafaxine 150 mg for many years feels his moods and symptoms are well controlled and he has no concerns or side effects Depression screen [facility] 2/9 08/19/2020 05/15/2020 11/02/2019  Decreased Interest 0 0 0  Down, Depressed, Hopeless 0 0 0  PHQ - 2 Score 0 0 0  Altered sleeping - 2 0  Tired, decreased energy - 0 0  Change in appetite - 0 0  Feeling bad or failure about yourself  - 0 0  Trouble concentrating - 0 0  Moving slowly or fidgety/restless - 0 0  Suicidal thoughts - 0 0  PHQ-9 Score - 2 0  Difficult doing work/chores - Not difficult at all Not difficult at all   No flowsheet data found.  PHQ reviewed today -negative  Patient has a history of hyperlipidemia Lab Results  Component Value Date   CHOL 222 (H) 11/04/2017   CHOL 219 (H) 01/14/2017   Lab Results  Component Value Date   HDL 41 11/04/2017   HDL 71 01/14/2017   Lab Results    Component Value Date   LDLCALC 155 (H) 11/04/2017   LDLCALC 106 (H) 01/14/2017   Lab Results  Component Value Date   TRIG 139 11/04/2017   TRIG 210 (H) 01/14/2017   Lab Results  Component Value Date   CHOLHDL 5.4 (H) 11/04/2017   CHOLHDL 3.1 01/14/2017   We reviewed ASCVD risk score  No results found for: LDLDIRECT     Current Outpatient Medications:  .  amLODipine (NORVASC) 10 MG tablet, Take 1 tablet (10 mg total) by mouth daily., Disp: 90 tablet, Rfl: 3 .  esomeprazole (NEXIUM) 40 MG capsule, Take 1 capsule (40 mg total) by mouth daily., Disp: 30 capsule, Rfl: 3 .  metoprolol succinate (TOPROL-XL) 100 MG 24 hr tablet, TAKE ONE TABLET BY MOUTH DAILY, Disp: 90 tablet, Rfl: 1 .  venlafaxine XR (EFFEXOR-XR) 150 MG 24 hr capsule, Take one capsule daily, Disp: 90 capsule, Rfl: 1  Patient Active Problem List   Diagnosis Date Noted  . Tobacco dependence 05/15/2020  . Substance induced mood disorder (HCC) 04/07/2019  . Abnormal nuclear stress test 12/31/2015  . Snoring 11/06/2015  . Elevated liver function tests 05/19/2015  . Fatty liver 05/19/2015  . Elevated serum GGT level 05/17/2015  . IFG (impaired fasting glucose)   . Hyperlipidemia   . Essential hypertension   .  Depression   . Anxiety   . GERD (gastroesophageal reflux disease)   . Overweight   . Alcohol abuse, episodic   . 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 CARDIAC CATHETERIZATION N/A 01/02/2016   Procedure: Left Heart Cath and Coronary Angiography;  Surgeon: Marykay Lexavid W Harding, MD;  [facility];  Service: Cardiovascular;  Laterality: N/A;  . CARPAL TUNNEL RELEASE Bilateral    right x 2, left x 1  . DIALYSIS/PERMA CATHETER INSERTION N/A 03/23/2018   Procedure: DIALYSIS/PERMA CATHETER INSERTION;  Surgeon: Renford DillsSchnier, Gregory G, MD;  Location: [facility] INVASIVE CV LAB;  Service: Cardiovascular;  Laterality: N/A;  . TESTICLE TORSION  REDUCTION    . TOTAL HIP ARTHROPLASTY Left 01/30/2014   Procedure: LEFT TOTAL HIP ARTHROPLASTY ANTERIOR APPROACH;  Surgeon: Shelda PalMatthew D Olin, MD;  Location: [facility] ORS;  Service: Orthopedics;  Laterality: Left;  Marland Kitchen. VASECTOMY  1995  . VASECTOMY REVERSAL  1997    Family History  Problem Relation Age of Onset  . Hypertension Mother   . Diabetes Mother   . Hypertension Father   . Stroke Father   . Cancer Neg Hx   . COPD Neg Hx   . Heart disease Neg Hx     Social History   Tobacco Use  . Smoking status: Current Every Day Smoker    Packs/day: 1.00    Years: 30.00    Pack years: 30.00    Types: Cigarettes  . Smokeless tobacco: Never Used  . Tobacco comment: discussed options   Vaping Use  . Vaping Use: Never used  Substance Use Topics  . Alcohol use: Not Currently    Comment: d/c ETOH 10 d ago 07/21/2019  . Drug use: No    Comment: denies     Allergies  Allergen Reactions  . Ace Inhibitors Anaphylaxis  . Captopril Anaphylaxis  . Enalapril Anaphylaxis  . Fosinopril Anaphylaxis  . Lisinopril Anaphylaxis  . Ramipril Anaphylaxis    Health Maintenance  Topic Date Due  . Hepatitis C Screening  Never done  . COVID-19 Vaccine (1) Never done  . COLONOSCOPY  Never done  . INFLUENZA VACCINE  01/02/2021 (Originally 05/05/2020)  . TETANUS/TDAP  10/05/2021  . HIV Screening  Completed    Chart Review Today: I personally reviewed active problem list, medication list, allergies, family history, social history, health maintenance, notes from last encounter, lab results, imaging with the patient/caregiver today.   Review of Systems  10 Systems reviewed and are negative for acute change except as noted in the HPI.  Objective:   Vitals:   08/19/20 0840 08/19/20 0847  BP: 140/90 138/90  Pulse: 76   Resp: 16   Temp: 98 F (36.7 C)   TempSrc: Oral   SpO2: 99%   Weight: 187 lb 4.8 oz (85 kg)   Height: 5\' 9"  (1.753 m)     Body mass index is 27.66 kg/m.  Physical Exam Vitals  and nursing note reviewed.  Constitutional:      General: He is not in acute distress.    Appearance: Normal appearance. He is well-developed. He is not ill-appearing, toxic-appearing or diaphoretic.     Interventions: Face mask in place.  HENT:     Head: Normocephalic and atraumatic.     Jaw: No trismus.     Right Ear: External ear normal.     Left Ear: External ear normal.  Eyes:     General:  Lids are normal. No scleral icterus.       Right eye: No discharge.        Left eye: No discharge.     Conjunctiva/sclera: Conjunctivae normal.  Neck:     Trachea: Trachea and phonation normal. No tracheal deviation.  Cardiovascular:     Rate and Rhythm: Normal rate and regular rhythm.     Pulses: Normal pulses.          Radial pulses are 2+ on the right side and 2+ on the left side.       Posterior tibial pulses are 2+ on the right side and 2+ on the left side.     Heart sounds: Normal heart sounds. No murmur heard.  No friction rub. No gallop.   Pulmonary:     Effort: Pulmonary effort is normal. No respiratory distress.     Breath sounds: Normal breath sounds. No stridor. No wheezing, rhonchi or rales.  Abdominal:     General: Bowel sounds are normal. There is no distension.     Palpations: Abdomen is soft.  Musculoskeletal:     Right lower leg: No edema.     Left lower leg: No edema.  Skin:    General: Skin is warm and dry.     Coloration: Skin is not jaundiced or pale.     Findings: No rash.     Nails: There is no clubbing.  Neurological:     Mental Status: He is alert. Mental status is at baseline.     Cranial Nerves: No dysarthria or facial asymmetry.     Motor: No tremor or abnormal muscle tone.     Gait: Gait normal.  Psychiatric:        Mood and Affect: Mood normal.        Speech: Speech normal.        Behavior: Behavior normal. Behavior is cooperative.         Assessment & Plan:     ICD-10-CM   1. Essential hypertension  I10 COMPLETE METABOLIC PANEL WITH GFR    Blood pressure above goal today but he did smoke right before appointment likely contributing, otherwise improving with metoprolol and amlodipine  2. Hyperlipidemia, unspecified hyperlipidemia type  E78.5 COMPLETE METABOLIC PANEL WITH GFR    Lipid panel   Elevated LDL with increased ASCVD risk, will recheck labs and recalculate risk reviewed recommendations and medications with the patient  3. Colon cancer screening  Z12.11 Ambulatory referral to Gastroenterology  4. Encounter for medication monitoring  Z51.81 CBC with Differential/Platelet    COMPLETE METABOLIC PANEL WITH GFR    Lipid panel    Hemoglobin A1c  5. Tobacco dependence  F17.200 varenicline (CHANTIX STARTING MONTH PAK) 0.5 MG X 11 & 1 MG X 42 tablet   Discuss smoking cessation today he would like to try Chantix, tobacco counseling and smoking cessation reviewed more than 5 minutes today  6. Prediabetes  R73.03 COMPLETE METABOLIC PANEL WITH GFR    Hemoglobin A1c   Recheck labs  7. Gastroesophageal reflux disease without esophagitis  K21.9 esomeprazole (NEXIUM) 40 MG capsule   Symptoms stable and well-controlled with Nexium  8. Encounter for smoking cessation counseling  Z71.6 varenicline (CHANTIX STARTING MONTH PAK) 0.5 MG X 11 & 1 MG X 42 tablet  9. Need for hepatitis C screening test  Z11.59 Hepatitis C antibody  10. 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  He would like to try it in the next couple of months once he gets through the holiday season.  He currently works for [employer] and his work schedule and stress is extremely high right now and both be the same getting through the holidays.  He does feel that his moods are well controlled, no history of  seizures, reviewed Chantix mechanism of action, side effects, how to take etc. and information was given today on local smoking cessation program  Encouraged him to monitor his blood pressure when he is relaxed and has not smoking recently, encouraged diet and lifestyle efforts for both improving cholesterol and managing his other diagnoses.  Smoking cessation instruction/counseling given:  counseled patient on the dangers of tobacco use, advised patient to stop smoking, and reviewed strategies to maximize success   Return in about 6 months (around 02/16/2021) for Routine follow-up with LT .   Danelle Berry, PA-C 08/19/20 9:15 AM

## 2020-08-20 LAB — CBC WITH DIFFERENTIAL/PLATELET
Absolute Monocytes: 427 cells/uL (ref 200–950)
Basophils Absolute: 71 cells/uL (ref 0–200)
Basophils Relative: 0.9 %
Eosinophils Absolute: 205 cells/uL (ref 15–500)
Eosinophils Relative: 2.6 %
HCT: 43.9 % (ref 38.5–50.0)
Hemoglobin: 14.9 g/dL (ref 13.2–17.1)
Lymphs Abs: 2560 cells/uL (ref 850–3900)
MCH: 29.9 pg (ref 27.0–33.0)
MCHC: 33.9 g/dL (ref 32.0–36.0)
MCV: 88 fL (ref 80.0–100.0)
MPV: 11 fL (ref 7.5–12.5)
Monocytes Relative: 5.4 %
Neutro Abs: 4637 cells/uL (ref 1500–7800)
Neutrophils Relative %: 58.7 %
Platelets: 321 10*3/uL (ref 140–400)
RBC: 4.99 10*6/uL (ref 4.20–5.80)
RDW: 13.8 % (ref 11.0–15.0)
Total Lymphocyte: 32.4 %
WBC: 7.9 10*3/uL (ref 3.8–10.8)

## 2020-08-20 LAB — COMPLETE METABOLIC PANEL WITH GFR
AG Ratio: 1.9 (calc) (ref 1.0–2.5)
ALT: 17 U/L (ref 9–46)
AST: 17 U/L (ref 10–35)
Albumin: 4.9 g/dL (ref 3.6–5.1)
Alkaline phosphatase (APISO): 80 U/L (ref 35–144)
BUN/Creatinine Ratio: 26 (calc) — ABNORMAL HIGH (ref 6–22)
BUN: 18 mg/dL (ref 7–25)
CO2: 25 mmol/L (ref 20–32)
Calcium: 10.2 mg/dL (ref 8.6–10.3)
Chloride: 103 mmol/L (ref 98–110)
Creat: 0.68 mg/dL — ABNORMAL LOW (ref 0.70–1.33)
GFR, Est African American: 128 mL/min/{1.73_m2} (ref >=60)
GFR, Est Non African American: 111 mL/min/{1.73_m2} (ref >=60)
Globulin: 2.6 g/dL (calc) (ref 1.9–3.7)
Glucose, Bld: 99 mg/dL (ref 65–99)
Potassium: 4.6 mmol/L (ref 3.5–5.3)
Sodium: 138 mmol/L (ref 135–146)
Total Bilirubin: 0.3 mg/dL (ref 0.2–1.2)
Total Protein: 7.5 g/dL (ref 6.1–8.1)

## 2020-08-20 LAB — LIPID PANEL
Cholesterol: 221 mg/dL — ABNORMAL HIGH (ref <200)
HDL: 51 mg/dL (ref >=40)
LDL Cholesterol (Calc): 130 mg/dL (calc) — ABNORMAL HIGH
Non-HDL Cholesterol (Calc): 170 mg/dL (calc) — ABNORMAL HIGH (ref <130)
Total CHOL/HDL Ratio: 4.3 (calc) (ref <5.0)
Triglycerides: 247 mg/dL — ABNORMAL HIGH (ref <150)

## 2020-08-20 LAB — HEMOGLOBIN A1C
Hgb A1c MFr Bld: 5.6 % of total Hgb (ref <5.7)
Mean Plasma Glucose: 114 (calc)
eAG (mmol/L): 6.3 (calc)

## 2020-08-20 LAB — HEPATITIS C ANTIBODY
Hepatitis C Ab: NONREACTIVE
SIGNAL TO CUT-OFF: 0.01 (ref <1.00)

## 2020-08-21 ENCOUNTER — Encounter: Payer: Self-pay | Admitting: Emergency Medicine

## 2020-08-21 MED ORDER — ATORVASTATIN CALCIUM 20 MG PO TABS: 20.0000 mg | ORAL_TABLET | Freq: Every day | ORAL | 3 refills | Status: DC

## 2020-08-21 NOTE — Addendum Note (Signed)
Addended by: Danelle Berry on: 08/21/2020 05:02 PM   Modules accepted: Orders

## 2020-09-02 ENCOUNTER — Telehealth: Payer: No Typology Code available for payment source

## 2020-09-03 ENCOUNTER — Telehealth: Payer: No Typology Code available for payment source

## 2020-11-18 ENCOUNTER — Other Ambulatory Visit: Payer: Self-pay

## 2020-11-18 DIAGNOSIS — F1010 Alcohol abuse, uncomplicated: Secondary | ICD-10-CM

## 2020-11-18 DIAGNOSIS — F3341 Major depressive disorder, recurrent, in partial remission: Secondary | ICD-10-CM

## 2020-11-18 DIAGNOSIS — F419 Anxiety disorder, unspecified: Secondary | ICD-10-CM

## 2020-11-18 MED ORDER — VENLAFAXINE HCL ER 150 MG PO CP24: ORAL_CAPSULE | ORAL | 1 refills | Status: DC

## 2021-02-17 ENCOUNTER — Ambulatory Visit: Payer: No Typology Code available for payment source | Admitting: Family Medicine

## 2021-03-14 ENCOUNTER — Other Ambulatory Visit: Payer: Self-pay | Admitting: Podiatry

## 2021-03-14 ENCOUNTER — Ambulatory Visit (INDEPENDENT_AMBULATORY_CARE_PROVIDER_SITE_OTHER): Payer: BLUE CROSS/BLUE SHIELD | Admitting: Podiatry

## 2021-03-14 ENCOUNTER — Encounter: Payer: Self-pay | Admitting: Podiatry

## 2021-03-14 ENCOUNTER — Ambulatory Visit (INDEPENDENT_AMBULATORY_CARE_PROVIDER_SITE_OTHER): Payer: BLUE CROSS/BLUE SHIELD

## 2021-03-14 ENCOUNTER — Other Ambulatory Visit: Payer: Self-pay

## 2021-03-14 DIAGNOSIS — M7742 Metatarsalgia, left foot: Secondary | ICD-10-CM

## 2021-03-14 DIAGNOSIS — M216X2 Other acquired deformities of left foot: Secondary | ICD-10-CM

## 2021-03-14 DIAGNOSIS — M7741 Metatarsalgia, right foot: Secondary | ICD-10-CM | POA: Diagnosis not present

## 2021-03-14 DIAGNOSIS — M779 Enthesopathy, unspecified: Secondary | ICD-10-CM

## 2021-03-14 DIAGNOSIS — Q667 Congenital pes cavus, unspecified foot: Secondary | ICD-10-CM | POA: Diagnosis not present

## 2021-03-14 DIAGNOSIS — M216X1 Other acquired deformities of right foot: Secondary | ICD-10-CM | POA: Diagnosis not present

## 2021-03-14 MED ORDER — METHYLPREDNISOLONE 4 MG PO TBPK: ORAL_TABLET | ORAL | 0 refills | Status: DC

## 2021-03-14 MED ORDER — MELOXICAM 15 MG PO TABS: 15.0000 mg | ORAL_TABLET | Freq: Every day | ORAL | 1 refills | Status: DC

## 2021-03-14 NOTE — Progress Notes (Signed)
   HPI: 50 y.o. male presenting today as a new patient for evaluation of pain and tenderness to the bilateral forefoot that is been going on approximately 3-4 months now.  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  He has stabbing pains and the forefoot becomes extremely painful by the end of the day.  He has tried OTC arch supports with minimal improvement.  He also has been taking BC powders and soaking his foot.  He presents for further treatment and evaluation  Past Medical History:  Diagnosis Date   Alcohol abuse, in remission    Anxiety    Arthritis    Avascular necrosis ([place])    Depression    GERD (gastroesophageal reflux disease)    History of alcohol abuse    Hyperlipidemia    Hypertension    IFG (impaired fasting glucose)    Overweight      Physical Exam: General: The patient is alert and oriented x3 in no acute distress.  Dermatology: Skin is warm, dry and supple bilateral lower extremities. Negative for open lesions or macerations.  Vascular: Palpable pedal pulses bilaterally. No edema or erythema noted. Capillary refill within normal limits.  Neurological: Epicritic and protective threshold grossly intact bilaterally.   Musculoskeletal Exam: Range of motion within normal limits to all pedal and ankle joints bilateral. Muscle strength 5/5 in all groups bilateral.  Tenderness to palpation along the plantar metatarsal heads bilateral  Radiographic Exam:  Normal osseous mineralization. Joint spaces preserved. No fracture/dislocation/boney destruction.  Increased calcaneal inclination angle and increased declination angle of the metatarsals consistent with a high medial longitudinal arch.  There is some met adductus also noted on AP view  Assessment: 1.  Pes cavus bilateral 2.  Metatarsalgia bilateral   Plan of Care:  1. Patient evaluated. X-Rays reviewed.  2.  I explained to the patient that his pain is mostly coming from his high  arches causing an increased amount of pressure to the forefoot  3.  Appointment with Pedorthist for custom molded orthotics with metatarsal offloads 4.  Plantar fascial braces were dispensed as well as metatarsal pads to help alleviate pressure from the forefoot temporarily until orthotics are made 5.  Prescription for Medrol Dosepak 6.  Prescription for meloxicam 15 mg daily after completion of the Dosepak 7.  Return to clinic for orthotic casting  *[employer] delivery driver     Edrick Kins, DPM [facility]  Dr. Edrick Kins, DPM    [address], [place] [address]                Office [phone]  Fax [phone]

## 2021-04-16 ENCOUNTER — Other Ambulatory Visit: Payer: BLUE CROSS/BLUE SHIELD

## 2021-05-12 ENCOUNTER — Telehealth: Payer: Self-pay

## 2021-05-12 ENCOUNTER — Other Ambulatory Visit: Payer: Self-pay | Admitting: Podiatry

## 2021-05-12 DIAGNOSIS — F3341 Major depressive disorder, recurrent, in partial remission: Secondary | ICD-10-CM

## 2021-05-12 DIAGNOSIS — I10 Essential (primary) hypertension: Secondary | ICD-10-CM

## 2021-05-12 DIAGNOSIS — F419 Anxiety disorder, unspecified: Secondary | ICD-10-CM

## 2021-05-12 DIAGNOSIS — F1010 Alcohol abuse, uncomplicated: Secondary | ICD-10-CM

## 2021-05-12 MED ORDER — METOPROLOL SUCCINATE ER 100 MG PO TB24: 100.0000 mg | ORAL_TABLET | Freq: Every day | ORAL | 0 refills | Status: DC

## 2021-05-12 MED ORDER — VENLAFAXINE HCL ER 150 MG PO CP24: ORAL_CAPSULE | ORAL | 0 refills | Status: DC

## 2021-05-12 MED ORDER — AMLODIPINE BESYLATE 10 MG PO TABS: 10.0000 mg | ORAL_TABLET | Freq: Every day | ORAL | 0 refills | Status: DC

## 2021-05-12 NOTE — Telephone Encounter (Signed)
Patient said that he would call us back when he leaves work today to make a appt for med refill. Dr only gave him enough for 30 days.

## 2021-05-12 NOTE — Telephone Encounter (Signed)
30 day supply given of meds needs f/u appt

## 2021-06-03 ENCOUNTER — Ambulatory Visit: Payer: No Typology Code available for payment source | Admitting: Family Medicine

## 2021-06-04 ENCOUNTER — Other Ambulatory Visit: Payer: Self-pay

## 2021-06-04 ENCOUNTER — Encounter: Payer: Self-pay | Admitting: Family Medicine

## 2021-06-04 ENCOUNTER — Ambulatory Visit (INDEPENDENT_AMBULATORY_CARE_PROVIDER_SITE_OTHER): Payer: BLUE CROSS/BLUE SHIELD | Admitting: Family Medicine

## 2021-06-04 VITALS — BP 140/86 | HR 98 | Temp 98.1°F | Resp 16 | Ht 69.0 in | Wt 196.9 lb

## 2021-06-04 DIAGNOSIS — F419 Anxiety disorder, unspecified: Secondary | ICD-10-CM

## 2021-06-04 DIAGNOSIS — F17200 Nicotine dependence, unspecified, uncomplicated: Secondary | ICD-10-CM

## 2021-06-04 DIAGNOSIS — Z87898 Personal history of other specified conditions: Secondary | ICD-10-CM

## 2021-06-04 DIAGNOSIS — F3341 Major depressive disorder, recurrent, in partial remission: Secondary | ICD-10-CM

## 2021-06-04 DIAGNOSIS — I10 Essential (primary) hypertension: Secondary | ICD-10-CM | POA: Diagnosis not present

## 2021-06-04 DIAGNOSIS — R7301 Impaired fasting glucose: Secondary | ICD-10-CM

## 2021-06-04 DIAGNOSIS — E785 Hyperlipidemia, unspecified: Secondary | ICD-10-CM

## 2021-06-04 DIAGNOSIS — R7989 Other specified abnormal findings of blood chemistry: Secondary | ICD-10-CM

## 2021-06-04 DIAGNOSIS — K219 Gastro-esophageal reflux disease without esophagitis: Secondary | ICD-10-CM

## 2021-06-04 DIAGNOSIS — F1010 Alcohol abuse, uncomplicated: Secondary | ICD-10-CM

## 2021-06-04 DIAGNOSIS — Z1211 Encounter for screening for malignant neoplasm of colon: Secondary | ICD-10-CM

## 2021-06-04 DIAGNOSIS — F1011 Alcohol abuse, in remission: Secondary | ICD-10-CM

## 2021-06-04 MED ORDER — VARENICLINE TARTRATE 0.5 MG PO TABS: ORAL_TABLET | ORAL | 0 refills | Status: DC

## 2021-06-04 MED ORDER — AMLODIPINE BESYLATE 10 MG PO TABS: 10.0000 mg | ORAL_TABLET | Freq: Every day | ORAL | 1 refills | Status: DC

## 2021-06-04 MED ORDER — ATORVASTATIN CALCIUM 20 MG PO TABS: 20.0000 mg | ORAL_TABLET | Freq: Every day | ORAL | 3 refills | Status: DC

## 2021-06-04 MED ORDER — METOPROLOL SUCCINATE ER 100 MG PO TB24: 100.0000 mg | ORAL_TABLET | Freq: Every day | ORAL | 1 refills | Status: DC

## 2021-06-04 MED ORDER — VENLAFAXINE HCL ER 150 MG PO CP24: ORAL_CAPSULE | ORAL | 0 refills | Status: DC

## 2021-06-04 MED ORDER — QUETIAPINE FUMARATE 25 MG PO TABS: 25.0000 mg | ORAL_TABLET | Freq: Every day | ORAL | 0 refills | Status: DC

## 2021-06-04 MED ORDER — ESOMEPRAZOLE MAGNESIUM 40 MG PO CPDR: 40.0000 mg | DELAYED_RELEASE_CAPSULE | Freq: Every day | ORAL | 3 refills | Status: DC

## 2021-06-04 NOTE — Assessment & Plan Note (Signed)
Recheck labs 

## 2021-06-04 NOTE — Assessment & Plan Note (Signed)
Counseled on cessation. Chantix sent to pharmacy. Referred for lung cancer screening.

## 2021-06-04 NOTE — Assessment & Plan Note (Signed)
Recheck labs. Statin refill sent.

## 2021-06-04 NOTE — Patient Instructions (Signed)
It was great to see you!  Our plans for today:  - We sent your medications to the pharmacy. New medications: quetiapine, chantix.  - Try the quetiapine at night for your depression/anxiety.  - we referred you for colon cancer screening. Let us know if you don't hear about an appointment.  - We ordered a CT scan of your chest for lung cancer screening, let us know if you don't hear about an appointment. - Come back in 4 weeks.   We are checking some labs today, we will release these results to your MyChart.  Take care and seek immediate care sooner if you develop any concerns.   Dr. Linwood Dibbles

## 2021-06-04 NOTE — Assessment & Plan Note (Signed)
Elevated today but reports is worked up and smoked just before coming for appointment. Will refill meds, obtain labs and reassess on follow up.

## 2021-06-04 NOTE — Assessment & Plan Note (Signed)
Refill provided. Counseled on smoking cessation.

## 2021-06-04 NOTE — Assessment & Plan Note (Signed)
Recheck a1c. °

## 2021-06-04 NOTE — Assessment & Plan Note (Signed)
S/p rehab. Doing well.

## 2021-06-04 NOTE — Progress Notes (Signed)
    SUBJECTIVE:   CHIEF COMPLAINT / HPI:   Hypertension: - Medications: amloidpine, metoprolol - Compliance: good - Checking BP at home: only when elevated.  - Denies any SOB, CP, vision changes, LE edema, medication SEs, or symptoms of hypotension  HLD - medications: lipitor - compliance: never got, wasn't aware he should take - medication SEs: n/a  Anxiety/Depression - Medications: effexor 150mg  - Taking: good compliance - Counseling: no - Previous hospitalizations: no - FH of psych illness: unknown - Symptoms: easily irritable  Depression screen [facility] 2/9 06/04/2021 08/19/2020 05/15/2020  Decreased Interest 1 0 0  Down, Depressed, Hopeless 1 0 0  PHQ - 2 Score 2 0 0  Altered sleeping 2 - 2  Tired, decreased energy 1 - 0  Change in appetite 0 - 0  Feeling bad or failure about yourself  0 - 0  Trouble concentrating 0 - 0  Moving slowly or fidgety/restless 0 - 0  Suicidal thoughts 0 - 0  PHQ-9 Score 5 - 2  Difficult doing work/chores Somewhat difficult - Not difficult at all   GAD 7 : Generalized Anxiety Score 06/04/2021  Nervous, Anxious, on Edge 1  Control/stop worrying 1  Worry too much - different things 3  Trouble relaxing 3  Restless 3  Easily annoyed or irritable 3  Afraid - awful might happen 0  Total GAD 7 Score 14  Anxiety Difficulty Somewhat difficult    GERD - Meds: nexium 40mg  - Symptoms:  heartburn.  - Denies chest pain, choking on food, difficulty swallowing, and unexpected weight loss denies dysphagia has not lost weight. Did have some darker blood in stool about 6-8 weeks ago x2 days when went back to drinking once.  - Previous treatment: proton pump inhibitors.  Tobacco use - prescribed Chantix at last visit, insurance didn't cover. Wants to retry now that he has different insurance. Smoking 1ppd. 40 years.   H/o EtOH use d/o - completed rehab program. Had one relapse episode few months ago, none since. Active in AA.    OBJECTIVE:   BP  140/86   Pulse 98   Temp 98.1 F (36.7 C)   Resp 16   Ht 5\' 9"  (1.753 m)   Wt 196 lb 14.4 oz (89.3 kg)   SpO2 96%   BMI 29.08 kg/m   Gen: well appearing, in NAD Card: Reg rate Lungs: Comfortable WOB on RA Ext: WWP   ASSESSMENT/PLAN:   Essential hypertension Elevated today but reports is worked up and smoked just before coming for appointment. Will refill meds, obtain labs and reassess on follow up.   GERD (gastroesophageal reflux disease) Refill provided. Counseled on smoking cessation.  IFG (impaired fasting glucose) Recheck a1c.  History of alcohol use disorder S/p rehab. Doing well.   Hyperlipidemia Recheck labs. Statin refill sent.  Tobacco dependence Counseled on cessation. Chantix sent to pharmacy. Referred for lung cancer screening.  Anxiety Worsened, uncontrolled. Also with insomnia, will trial seroquel for mood stabilization and sleep. F/u in 4 weeks.   Elevated liver function tests Recheck labs.    Health maintenance - referred for colonoscopy. Declined flu and COVID vaccines.   06/06/2021, DO

## 2021-06-04 NOTE — Assessment & Plan Note (Signed)
Worsened, uncontrolled. Also with insomnia, will trial seroquel for mood stabilization and sleep. F/u in 4 weeks.

## 2021-06-05 LAB — LIPID PANEL
Cholesterol: 235 mg/dL — ABNORMAL HIGH (ref <200)
HDL: 41 mg/dL (ref >=40)
LDL Cholesterol (Calc): 151 mg/dL (calc) — ABNORMAL HIGH
Non-HDL Cholesterol (Calc): 194 mg/dL (calc) — ABNORMAL HIGH (ref <130)
Total CHOL/HDL Ratio: 5.7 (calc) — ABNORMAL HIGH (ref <5.0)
Triglycerides: 264 mg/dL — ABNORMAL HIGH (ref <150)

## 2021-06-05 LAB — COMPLETE METABOLIC PANEL WITH GFR
AG Ratio: 1.6 (calc) (ref 1.0–2.5)
ALT: 18 U/L (ref 9–46)
AST: 17 U/L (ref 10–35)
Albumin: 4.6 g/dL (ref 3.6–5.1)
Alkaline phosphatase (APISO): 105 U/L (ref 35–144)
BUN: 16 mg/dL (ref 7–25)
CO2: 27 mmol/L (ref 20–32)
Calcium: 10.4 mg/dL — ABNORMAL HIGH (ref 8.6–10.3)
Chloride: 104 mmol/L (ref 98–110)
Creat: 0.89 mg/dL (ref 0.70–1.30)
Globulin: 2.9 g/dL (calc) (ref 1.9–3.7)
Glucose, Bld: 101 mg/dL — ABNORMAL HIGH (ref 65–99)
Potassium: 4.4 mmol/L (ref 3.5–5.3)
Sodium: 141 mmol/L (ref 135–146)
Total Bilirubin: 0.3 mg/dL (ref 0.2–1.2)
Total Protein: 7.5 g/dL (ref 6.1–8.1)
eGFR: 103 mL/min/{1.73_m2} (ref >=60)

## 2021-06-05 LAB — HEMOGLOBIN A1C
Hgb A1c MFr Bld: 5.7 % of total Hgb — ABNORMAL HIGH (ref <5.7)
Mean Plasma Glucose: 117 mg/dL
eAG (mmol/L): 6.5 mmol/L

## 2021-06-18 ENCOUNTER — Other Ambulatory Visit: Payer: BLUE CROSS/BLUE SHIELD

## 2021-07-02 ENCOUNTER — Telehealth: Payer: BLUE CROSS/BLUE SHIELD | Admitting: Family Medicine

## 2021-07-09 ENCOUNTER — Ambulatory Visit: Payer: BLUE CROSS/BLUE SHIELD | Admitting: Family Medicine

## 2021-07-09 NOTE — Progress Notes (Deleted)
    SUBJECTIVE:   CHIEF COMPLAINT / HPI:   Hypertension: - Medications: amloidpine, metoprolol - Compliance: *** - Checking BP at home: *** - Denies any SOB, CP, vision changes, LE edema, medication SEs, or symptoms of hypotension - Diet: *** - Exercise: ***  HLD - medications: lipitor - compliance: *** - medication SEs: ***  Anxiety - Medications: effexor 150mg , seroquel - Taking: *** - Counseling: *** - Previous hospitalizations: no - FH of psych illness: unknown - Symptoms: *** - Current stressors: *** - Coping Mechanisms: ***  Tobacco use - 40 pack year history. Prescribed chantix at last visit.   OBJECTIVE:   There were no vitals taken for this visit.  ***  ASSESSMENT/PLAN:   No problem-specific Assessment & Plan notes found for this encounter.     , DO [facility]

## 2021-09-10 ENCOUNTER — Other Ambulatory Visit: Payer: Self-pay | Admitting: Family Medicine

## 2021-09-10 DIAGNOSIS — F1010 Alcohol abuse, uncomplicated: Secondary | ICD-10-CM

## 2021-09-10 DIAGNOSIS — F3341 Major depressive disorder, recurrent, in partial remission: Secondary | ICD-10-CM

## 2021-09-10 DIAGNOSIS — F419 Anxiety disorder, unspecified: Secondary | ICD-10-CM

## 2021-09-10 NOTE — Telephone Encounter (Signed)
Requested medications are due for refill today.  yes  Requested medications are on the active medications list.  yes  Last refill. 06/04/2021 for both  Future visit scheduled.   no  Notes to clinic.  Per note of 06/04/2021 visit, pt was to follow up in 4 weeks. Pt no showed for 2 appointments 07/09/2021 and 06/18/2021. 1 Medication not delegated.    Requested Prescriptions  Pending Prescriptions Disp Refills   QUEtiapine (SEROQUEL) 25 MG tablet [Pharmacy Med Name: QUEtiapine FUMARATE 25 MG TAB] 90 tablet 0    Sig: TAKE 1 TABLET BY MOUTH AT BEDTIME     Not Delegated - Psychiatry:  Antipsychotics - Second Generation (Atypical) - quetiapine Failed - 09/10/2021  7:46 PM      Failed - This refill cannot be delegated      Failed - Last BP in normal range    BP Readings from Last 1 Encounters:  06/04/21 140/86          Passed - ALT in normal range and within 180 days    ALT  Date Value Ref Range Status  06/04/2021 18 9 - 46 U/L Final   ALT (SGPT) Piccolo, Waived  Date Value Ref Range Status  11/06/2015 45 10 - 47 U/L Final          Passed - AST in normal range and within 180 days    AST  Date Value Ref Range Status  06/04/2021 17 10 - 35 U/L Final   AST (SGOT) Piccolo, Waived  Date Value Ref Range Status  11/06/2015 70 (H) 11 - 38 U/L Final          Passed - Completed PHQ-2 or PHQ-9 in the last 360 days      Passed - Valid encounter within last 6 months    Recent Outpatient Visits           3 months ago Essential hypertension   [facility] Caro Laroche, DO   1 year ago Essential hypertension   [facility] Danelle Berry, PA-C   1 year ago Essential hypertension   [facility] Jamelle Haring, MD   1 year ago Essential hypertension   [facility] [place], Irving Burton E, FNP   2 years ago Substance induced mood disorder [facility])   [facility] Danelle Berry, PA-C                venlafaxine XR (EFFEXOR-XR) 150 MG 24 hr capsule [Pharmacy Med Name: VENLAFAXINE HCL ER 150 MG CAP] 90 capsule 0    Sig: TAKE ONE CAPSULE BY MOUTH DAILY     Psychiatry: Antidepressants - SNRI - desvenlafaxine & venlafaxine Failed - 09/10/2021  7:46 PM      Failed - LDL in normal range and within 360 days    LDL Cholesterol (Calc)  Date Value Ref Range Status  06/04/2021 151 (H) mg/dL (calc) Final    Comment:    Reference range: <100 . Desirable range <100 mg/dL for primary prevention;   <70 mg/dL for patients with CHD or diabetic patients  with > or = 2 CHD risk factors. Marland Kitchen LDL-C is now calculated using the Martin-Hopkins  calculation, which is a validated novel method providing  better accuracy than the Friedewald equation in the  estimation of LDL-C.  Horald Pollen et al. Lenox Ahr. 6283;151(76): 2061-2068  (http://education.Quest[employer]ostics.com/faq/FAQ164)           Failed - Total Cholesterol in normal  range and within 360 days    Cholesterol, Total  Date Value Ref Range Status  11/06/2015 224 (H) 100 - 199 mg/dL Final   Cholesterol  Date Value Ref Range Status  06/04/2021 235 (H) <200 mg/dL Final          Failed - Triglycerides in normal range and within 360 days    Triglycerides  Date Value Ref Range Status  06/04/2021 264 (H) <150 mg/dL Final    Comment:    . If a non-fasting specimen was collected, consider repeat triglyceride testing on a fasting specimen if clinically indicated.  Perry Mount et al. J. of Clin. Lipidol. 2015;9:129-169. .           Failed - Last BP in normal range    BP Readings from Last 1 Encounters:  06/04/21 140/86          Passed - Completed PHQ-2 or PHQ-9 in the last 360 days      Passed - Valid encounter within last 6 months    Recent Outpatient Visits           3 months ago Essential hypertension   [facility] Caro Laroche, DO   1 year ago Essential hypertension   [facility] Danelle Berry, PA-C   1 year ago Essential hypertension   [facility] Jamelle Haring, MD   1 year ago Essential hypertension   [facility] Doren Custard, FNP   2 years ago Substance induced mood disorder [facility])   [facility] Danelle Berry, [place]

## 2021-09-15 NOTE — Telephone Encounter (Signed)
Lvm to schedule an 3 month followup

## 2021-11-16 ENCOUNTER — Other Ambulatory Visit: Payer: Self-pay | Admitting: Family Medicine

## 2021-11-16 DIAGNOSIS — I10 Essential (primary) hypertension: Secondary | ICD-10-CM

## 2021-11-17 NOTE — Telephone Encounter (Signed)
Courtesy refill. Patient will need an office visit for further refills. Requested Prescriptions  Pending Prescriptions Disp Refills   metoprolol succinate (TOPROL-XL) 100 MG 24 hr tablet [Pharmacy Med Name: METOPROLOL SUCC ER 100 MG TAB] 30 tablet 0    Sig: TAKE 1 TABLET BY MOUTH DAILY. TAKE WITH OR IMMEDIATELY FOLLOWING A MEAL     Cardiovascular:  Beta Blockers Failed - 11/16/2021  5:12 PM      Failed - Last BP in normal range    BP Readings from Last 1 Encounters:  06/04/21 140/86         Passed - Last Heart Rate in normal range    Pulse Readings from Last 1 Encounters:  06/04/21 98         Passed - Valid encounter within last 6 months    Recent Outpatient Visits          5 months ago Essential hypertension   [facility] Caro Laroche, DO   1 year ago Essential hypertension   [facility] Danelle Berry, PA-C   1 year ago Essential hypertension   [facility] Jamelle Haring, MD   2 years ago Essential hypertension   [facility] Doren Custard, FNP   2 years ago Substance induced mood disorder [facility])   [facility] Danelle Berry, [place]

## 2021-12-18 ENCOUNTER — Other Ambulatory Visit: Payer: Self-pay

## 2021-12-18 MED ORDER — VENLAFAXINE HCL ER 150 MG PO CP24: ORAL_CAPSULE | ORAL | 0 refills | Status: DC

## 2021-12-18 NOTE — Telephone Encounter (Signed)
Per Dr Angelica Chessman Pt needs f/up appt for future refills. Appt needs to be made in June 2023

## 2021-12-22 ENCOUNTER — Other Ambulatory Visit: Payer: Self-pay

## 2021-12-22 MED ORDER — QUETIAPINE FUMARATE 25 MG PO TABS: 25.0000 mg | ORAL_TABLET | Freq: Every day | ORAL | 0 refills | Status: DC

## 2021-12-22 NOTE — Telephone Encounter (Signed)
I haven't seen this guy in over a 1.5 years - you started this med last Aug - I would leave it up to you to refill?  Hasn't been seen since ?

## 2021-12-24 ENCOUNTER — Telehealth: Payer: Self-pay | Admitting: Family Medicine

## 2021-12-24 NOTE — Telephone Encounter (Signed)
metoprolol succinate (TOPROL-XL) 100 MG 24 hr tablet 30 tablet 0 11/17/2021    ?Sig: TAKE 1 TABLET BY MOUTH DAILY. TAKE WITH OR IMMEDIATELY FOLLOWING A MEAL   ?Sent to pharmacy as: metoprolol succinate (TOPROL-XL) 100 MG 24 hr tablet   ?Notes to Pharmacy: Courtesy refill. Patient will need an office visit for further refills.   ?E-Prescribing Status: Receipt confirmed by pharmacy (11/17/2021  9:18 PM EST)   ?Pt has had a situation with having to take care of father out of town and requested refills and got all but the above. It is for his BP which he knows in running high as he can feel it as had had no bp meds in a couple days. Pt has made an appt for Monday am the 27th. He wants 5 or 6 pills to last till he is seen on Monday. Note pharmacy is in [place] due to his father.  ? ? ?[facility] [place] - [address]  ?[address] [place] [address]  ?Phone: [phone] Fax: [phone]  ? ?

## 2021-12-26 ENCOUNTER — Other Ambulatory Visit: Payer: Self-pay | Admitting: Family Medicine

## 2021-12-26 DIAGNOSIS — I10 Essential (primary) hypertension: Secondary | ICD-10-CM

## 2021-12-26 MED ORDER — METOPROLOL SUCCINATE ER 100 MG PO TB24: 100.0000 mg | ORAL_TABLET | Freq: Every day | ORAL | 0 refills | Status: DC

## 2021-12-26 NOTE — Telephone Encounter (Signed)
The patient has called to request three (3) metoprolol succinate (TOPROL-XL) 100 MG 24 hr tablet [id number]  ? ?The patient has been told that their full prescription cannot be filled until their appointment on Monday 12/29/21 ? ?The patient shares that they need enough medication to make it through the weekend  ? ?The patient says they fully plan to attend their appt on Monday  ? ?The patient would like the medication sent to ?[place], [place] [address] ?Phone: [phone] Fax: [phone] ?Hours: Not open 24 hours ? ?Please contact further when possible  ?

## 2021-12-26 NOTE — Telephone Encounter (Signed)
Courtesy refill given to last until appointment. ? ?Requested Prescriptions  ?Pending Prescriptions Disp Refills  ?? metoprolol succinate (TOPROL-XL) 100 MG 24 hr tablet 5 tablet 0  ?  Sig: Take 1 tablet (100 mg total) by mouth daily. TAKE WITH OR IMMEDIATELY FOLLOWING A MEAL. OFFICE VISIT NEEDED FOR ADDITIONAL REFILLS  ?  ? Cardiovascular:  Beta Blockers Failed - 12/26/2021  7:34 PM  ?  ?  Failed - Last BP in normal range  ?  BP Readings from Last 1 Encounters:  ?06/04/21 140/86  ?   ?  ?  Failed - Valid encounter within last 6 months  ?  Recent Outpatient Visits   ?      ? 6 months ago Essential hypertension  ? [facility] Dense M, DO  ? 1 year ago Essential hypertension  ? [facility] Danelle Berry, PA-C  ? 1 year ago Essential hypertension  ? [facility] Welford Roche D, MD  ? 2 years ago Essential hypertension  ? [facility] Maurice Small E, FNP  ? 2 years ago Substance induced mood disorder [facility])  ? [facility] Danelle Berry, [place]  ?  ?  ?Future Appointments   ?        ? In 3 days Rumball, Darl Householder, DO [facility]  ?  ? ?  ?  ?  Passed - Last Heart Rate in normal range  ?  Pulse Readings from Last 1 Encounters:  ?06/04/21 98  ?   ?  ?  ? ?

## 2021-12-29 ENCOUNTER — Encounter: Payer: Self-pay | Admitting: Family Medicine

## 2021-12-29 ENCOUNTER — Ambulatory Visit (INDEPENDENT_AMBULATORY_CARE_PROVIDER_SITE_OTHER): Payer: BLUE CROSS/BLUE SHIELD | Admitting: Family Medicine

## 2021-12-29 VITALS — BP 124/84 | HR 109 | Temp 97.8°F | Resp 16 | Ht 69.0 in | Wt 190.8 lb

## 2021-12-29 DIAGNOSIS — I10 Essential (primary) hypertension: Secondary | ICD-10-CM

## 2021-12-29 DIAGNOSIS — E785 Hyperlipidemia, unspecified: Secondary | ICD-10-CM

## 2021-12-29 DIAGNOSIS — F1994 Other psychoactive substance use, unspecified with psychoactive substance-induced mood disorder: Secondary | ICD-10-CM

## 2021-12-29 DIAGNOSIS — F419 Anxiety disorder, unspecified: Secondary | ICD-10-CM

## 2021-12-29 DIAGNOSIS — E663 Overweight: Secondary | ICD-10-CM

## 2021-12-29 DIAGNOSIS — K219 Gastro-esophageal reflux disease without esophagitis: Secondary | ICD-10-CM

## 2021-12-29 DIAGNOSIS — R7989 Other specified abnormal findings of blood chemistry: Secondary | ICD-10-CM

## 2021-12-29 DIAGNOSIS — F1010 Alcohol abuse, uncomplicated: Secondary | ICD-10-CM

## 2021-12-29 DIAGNOSIS — F17200 Nicotine dependence, unspecified, uncomplicated: Secondary | ICD-10-CM

## 2021-12-29 DIAGNOSIS — Z87898 Personal history of other specified conditions: Secondary | ICD-10-CM

## 2021-12-29 DIAGNOSIS — F3341 Major depressive disorder, recurrent, in partial remission: Secondary | ICD-10-CM

## 2021-12-29 LAB — COMPREHENSIVE METABOLIC PANEL
AG Ratio: 1.7 (calc) (ref 1.0–2.5)
ALT: 23 U/L (ref 9–46)
AST: 19 U/L (ref 10–35)
Albumin: 4.3 g/dL (ref 3.6–5.1)
Alkaline phosphatase (APISO): 97 U/L (ref 35–144)
BUN/Creatinine Ratio: 15 (calc) (ref 6–22)
BUN: 10 mg/dL (ref 7–25)
CO2: 28 mmol/L (ref 20–32)
Calcium: 9.4 mg/dL (ref 8.6–10.3)
Chloride: 105 mmol/L (ref 98–110)
Creat: 0.65 mg/dL — ABNORMAL LOW (ref 0.70–1.30)
Globulin: 2.6 g/dL (calc) (ref 1.9–3.7)
Glucose, Bld: 117 mg/dL — ABNORMAL HIGH (ref 65–99)
Potassium: 3.7 mmol/L (ref 3.5–5.3)
Sodium: 143 mmol/L (ref 135–146)
Total Bilirubin: 0.4 mg/dL (ref 0.2–1.2)
Total Protein: 6.9 g/dL (ref 6.1–8.1)

## 2021-12-29 LAB — LIPID PANEL
Cholesterol: 233 mg/dL — ABNORMAL HIGH (ref <200)
HDL: 52 mg/dL (ref >=40)
LDL Cholesterol (Calc): 138 mg/dL (calc) — ABNORMAL HIGH
Non-HDL Cholesterol (Calc): 181 mg/dL (calc) — ABNORMAL HIGH (ref <130)
Total CHOL/HDL Ratio: 4.5 (calc) (ref <5.0)
Triglycerides: 282 mg/dL — ABNORMAL HIGH (ref <150)

## 2021-12-29 MED ORDER — METOPROLOL SUCCINATE ER 100 MG PO TB24: 100.0000 mg | ORAL_TABLET | Freq: Every day | ORAL | 1 refills | Status: DC

## 2021-12-29 MED ORDER — AMLODIPINE BESYLATE 10 MG PO TABS: 10.0000 mg | ORAL_TABLET | Freq: Every day | ORAL | 1 refills | Status: DC

## 2021-12-29 MED ORDER — BUSPIRONE HCL 7.5 MG PO TABS: 7.5000 mg | ORAL_TABLET | Freq: Two times a day (BID) | ORAL | 1 refills | Status: DC

## 2021-12-29 MED ORDER — QUETIAPINE FUMARATE 25 MG PO TABS: 25.0000 mg | ORAL_TABLET | Freq: Every day | ORAL | 1 refills | Status: DC

## 2021-12-29 MED ORDER — VENLAFAXINE HCL ER 150 MG PO CP24: ORAL_CAPSULE | ORAL | 1 refills | Status: DC

## 2021-12-29 NOTE — Assessment & Plan Note (Signed)
Worsened with recent life stressors. Sleep improved with seroquel but with some daytime grogginess, will add prn buspar. F/u in 1-3 months.  ?

## 2021-12-29 NOTE — Assessment & Plan Note (Signed)
Not yet ready to quit. Continue to assess readiness. ?

## 2021-12-29 NOTE — Patient Instructions (Signed)
It was great to see you! ? ?Our plans for today:  ?- take the buspirone as needed for anxiety. No changes to your other medications.  ?- We are checking some labs today, we will release these results to your MyChart. ? ?Take care and seek immediate care sooner if you develop any concerns.  ? ?Dr. Linwood Dibbles ? ?

## 2021-12-29 NOTE — Assessment & Plan Note (Signed)
Doing well on current regimen, no changes made today. Refills provided. Obtaining labs. ?

## 2021-12-29 NOTE — Assessment & Plan Note (Signed)
Counseled on statin use. Wants to recheck labs prior to starting. Obtaining labs today. If remains resistant to statin use, consider addition of zetia.  ?

## 2021-12-29 NOTE — Assessment & Plan Note (Signed)
Doing well on current regimen, no changes made today. 

## 2021-12-29 NOTE — Progress Notes (Signed)
? ?  SUBJECTIVE:  ? ?CHIEF COMPLAINT / HPI:  ? ?Hypertension: ?- Medications: amlodipine, metoprolol ?- Compliance: good, has been out for a few days. Did take this am.  ?- Checking BP at home: no ?- Denies any SOB, CP, vision changes, LE edema, medication SEs, or symptoms of hypotension ? ?HLD ?- medications: lipitor ?- compliance: has never taken, is concerned about potential side effects ?- medication SEs: n/a ?  ?Anxiety/Depression ?- Medications: effexor 150mg , seroquel 25mg  (started at last visit) ?- Taking: good compliance ?- Counseling: no ?- Previous hospitalizations: no ?- FH of psych illness: unknown ?- Symptoms: anxious, easily irritable. Sleep sometimes helped by seroquel.  ?- Stressors: currently going through separation from wife, living with dad ? ? ?  12/29/2021  ?  9:06 AM 06/04/2021  ?  3:43 PM  ?GAD 7 : Generalized Anxiety Score  ?Nervous, Anxious, on Edge 3 1  ?Control/stop worrying 1 1  ?Worry too much - different things 1 3  ?Trouble relaxing 1 3  ?Restless 1 3  ?Easily annoyed or irritable 3 3  ?Afraid - awful might happen 1 0  ?Total GAD 7 Score 11 14  ?Anxiety Difficulty  Somewhat difficult  ? ? ? ?  12/29/2021  ?  8:58 AM 06/04/2021  ?  3:31 PM 08/19/2020  ?  8:42 AM  ?Depression screen PHQ 2/9  ?Decreased Interest 0 1 0  ?Down, Depressed, Hopeless 0 1 0  ?PHQ - 2 Score 0 2 0  ?Altered sleeping 0 2   ?Tired, decreased energy 0 1   ?Change in appetite 0 0   ?Feeling bad or failure about yourself  0 0   ?Trouble concentrating 0 0   ?Moving slowly or fidgety/restless 0 0   ?Suicidal thoughts 0 0   ?PHQ-9 Score 0 5   ?Difficult doing work/chores Not difficult at all Somewhat difficult   ? ? ? ?GERD ?- Meds: nexium 40mg  ?- Symptoms:  acid reflux.  ?- Denies denies dysphagia has not lost weight.  ?- Previous treatment: proton pump inhibitors. ? ?Tobacco use - prescribed Chantix at last visit, has not taken. Smoking 1ppd. 40 years. Not ready to quit. ?  ?H/o EtOH use d/o - completed rehab program. Had  one relapse episode few few weeks ago, none since. Previously involved in AA, currently looking for active chapter. ? ? ?OBJECTIVE:  ? ?BP 124/84   Pulse (!) 109   Temp 97.8 ?F (36.6 ?C)   Resp 16   Ht 5\' 9"  (1.753 m)   Wt 190 lb 12.8 oz (86.5 kg)   SpO2 98%   BMI 28.18 kg/m?   ?Gen: well appearing, in NAD ?Card: RRR ?Lungs: CTAB ?Ext: WWP, no edema ? ? ?ASSESSMENT/PLAN:  ? ?Essential hypertension ?Doing well on current regimen, no changes made today. Refills provided. Obtaining labs. ? ?GERD (gastroesophageal reflux disease) ?Doing well on current regimen, no changes made today. ? ?Anxiety ?Worsened with recent life stressors. Sleep improved with seroquel but with some daytime grogginess, will add prn buspar. F/u in 1-3 months.  ? ?Hyperlipidemia ?Counseled on statin use. Wants to recheck labs prior to starting. Obtaining labs today. If remains resistant to statin use, consider addition of zetia.  ? ?Tobacco dependence ?Not yet ready to quit. Continue to assess readiness. ?  ? ? ?08/21/2020, DO ?

## 2022-02-03 ENCOUNTER — Other Ambulatory Visit: Payer: Self-pay

## 2022-02-03 ENCOUNTER — Telehealth: Payer: Self-pay

## 2022-02-03 MED ORDER — BUSPIRONE HCL 15 MG PO TABS: 15.0000 mg | ORAL_TABLET | Freq: Two times a day (BID) | ORAL | 0 refills | Status: DC | PRN

## 2022-02-03 NOTE — Telephone Encounter (Signed)
Copied from CRM [id number]. Topic: General - Other >> Feb 02, 2022  4:57 PM Marylen Ponto wrote: Reason for CRM: Pt requests that the Rx for busPIRone (BUSPAR) 7.5 MG tablet dosage be increased to 25 MG and sent to [facility] [place] [place] - [address] Phone: [phone]  Fax: [phone]

## 2022-02-26 ENCOUNTER — Telehealth: Payer: Self-pay | Admitting: Family Medicine

## 2022-02-27 NOTE — Telephone Encounter (Signed)
rx dose was increased on 02/03/22.  Requested Prescriptions  Pending Prescriptions Disp Refills  . busPIRone (BUSPAR) 7.5 MG tablet [Pharmacy Med Name: busPIRone HCL 7.5 MG TABLET] 60 tablet     Sig: TAKE ONE TABLET BY MOUTH TWICE A DAY     Psychiatry: Anxiolytics/Hypnotics - Non-controlled Passed - 02/26/2022  6:22 AM      Passed - Valid encounter within last 12 months    Recent Outpatient Visits          2 months ago Essential hypertension   [facility] Caro Laroche, DO   8 months ago Essential hypertension   [facility] Caro Laroche, DO   1 year ago Essential hypertension   [facility] Danelle Berry, PA-C   1 year ago Essential hypertension   [facility] Jamelle Haring, MD   2 years ago Essential hypertension   [facility] Doren Custard, [place]      Future Appointments            In 1 month Danelle Berry, PA-C [facility]

## 2022-03-30 NOTE — Progress Notes (Deleted)
Established Patient Office Visit  Subjective   Patient ID: [name] White, male    DOB: [birth year]  Age: 51 y.o. MRN: [id number]  No chief complaint on file.   HPI [name] White is a 51 year old male here for follow up on chronic medical conditions.   Hypertension: -Medications: Amlodipine 10 mg, Metoprololl XL 100 mg  -Patient is compliant with above medications and reports no side effects. -Checking BP at home (average): *** -Highest BP at home: *** -Lowest BP at home: *** -Denies any SOB, CP, vision changes, LE edema or symptoms of hypotension -Diet: *** -Exercise: ***  HLD: -Medications: Lipitor 20  -Patient is compliant with above medications and reports no side effects. *** -Last lipid panel: Lipid Panel     Component Value Date/Time   CHOL 233 (H) 12/29/2021 0924   CHOL 224 (H) 11/06/2015 1419   TRIG 282 (H) 12/29/2021 0924   HDL 52 12/29/2021 0924   HDL 74 11/06/2015 1419   CHOLHDL 4.5 12/29/2021 0924   VLDL 42 (H) 01/14/2017 1052   LDLCALC 138 (H) 12/29/2021 0924   LABVLDL 42 (H) 11/06/2015 1419   The 10-year ASCVD risk score (Arnett DK, et al., 2019) is: 11.8%   Values used to calculate the score:     Age: 11 years     Sex: Male     Is Non-Hispanic African American: No     Diabetic: No     Tobacco smoker: Yes     Systolic Blood Pressure: 124 mmHg     Is BP treated: Yes     HDL Cholesterol: 52 mg/dL     Total Cholesterol: 233 mg/dL   MDD: -Mood status: {Blank single:19197::"controlled","uncontrolled","better","worse","exacerbated","stable"} -Current treatment: Effexor 150 mg, Seroquel 25 mg, Buspar 15 mg PRN -Satisfied with current treatment?: {Blank single:19197::"yes","no"} -Symptom severity: {Blank single:19197::"mild","moderate","severe"}  -Duration of current treatment : {Blank single:19197::"chronic","months","years"} -Side effects: {Blank single:19197::"yes","no"} Medication compliance: {Blank single:19197::"excellent compliance","good  compliance","fair compliance","poor compliance"} Psychotherapy/counseling: {Blank single:19197::"yes","no"} {Blank single:19197::"current","in the past"} Previous psychiatric medications: {Blank multiple:19196::"abilify","amitryptiline","buspar","celexa","cymbalta","depakote","effexor","lamictal","lexapro","lithium","nortryptiline","paxil","prozac","pristiq (desvenlafaxine","seroquel","wellbutrin","zoloft","zyprexa"} Depressed mood: {Blank single:19197::"yes","no"} Anxious mood: {Blank single:19197::"yes","no"} Anhedonia: {Blank single:19197::"yes","no"} Significant weight loss or gain: {Blank single:19197::"yes","no"} Insomnia: {Blank single:19197::"yes","no"} {Blank single:19197::"hard to fall asleep","hard to stay asleep"} Fatigue: {Blank single:19197::"yes","no"} Feelings of worthlessness or guilt: {Blank single:19197::"yes","no"} Impaired concentration/indecisiveness: {Blank single:19197::"yes","no"} Suicidal ideations: {Blank single:19197::"yes","no"} Hopelessness: {Blank single:19197::"yes","no"} Crying spells: {Blank single:19197::"yes","no"}    12/29/2021    8:58 AM 06/04/2021    3:31 PM 08/19/2020    8:42 AM 05/15/2020    2:52 PM 11/02/2019   10:05 AM  Depression screen PHQ 2/9  Decreased Interest 0 1 0 0 0  Down, Depressed, Hopeless 0 1 0 0 0  PHQ - 2 Score 0 2 0 0 0  Altered sleeping 0 2  2 0  Tired, decreased energy 0 1  0 0  Change in appetite 0 0  0 0  Feeling bad or failure about yourself  0 0  0 0  Trouble concentrating 0 0  0 0  Moving slowly or fidgety/restless 0 0  0 0  Suicidal thoughts 0 0  0 0  PHQ-9 Score 0 5  2 0  Difficult doing work/chores Not difficult at all Somewhat difficult  Not difficult at all Not difficult at all   GERD: -Currently on Nexium 40 mg daily  Hx of Alcohol Use Disorder: -Completed rehab program  -Last drink ? -Had been involved in AA in the past  Health Maintenance: -Blood work UTD -Tdap due   {History  (  Optional):23778}  ROS    Objective:     There were no vitals taken for this visit. {Vitals History (Optional):23777}  Physical Exam   No results found for any visits on 03/31/22.  {Labs (Optional):23779}  The 10-year ASCVD risk score (Arnett DK, et al., 2019) is: 11.8%    Assessment & Plan:   Problem List Items Addressed This Visit   None   No follow-ups on file.    Margarita Mail, DO

## 2022-03-31 ENCOUNTER — Ambulatory Visit: Payer: Self-pay | Admitting: Internal Medicine

## 2022-04-03 ENCOUNTER — Other Ambulatory Visit: Payer: Self-pay | Admitting: Family Medicine

## 2022-04-03 NOTE — Telephone Encounter (Signed)
Requested Prescriptions  Pending Prescriptions Disp Refills  . busPIRone (BUSPAR) 15 MG tablet [Pharmacy Med Name: BUSPIRONE HCL 15 MG TABLET] 180 tablet 0    Sig: TAKE ONE TO TWO TABLETS BY MOUTH TWO TIMES A DAY AS NEEDED FOR ANXIETY     Psychiatry: Anxiolytics/Hypnotics - Non-controlled Passed - 04/03/2022  6:22 AM      Passed - Valid encounter within last 12 months    Recent Outpatient Visits          3 months ago Essential hypertension   [facility] Caro Laroche, DO   10 months ago Essential hypertension   [facility] Caro Laroche, DO   1 year ago Essential hypertension   [facility] Danelle Berry, PA-C   1 year ago Essential hypertension   [facility] Jamelle Haring, MD   2 years ago Essential hypertension   [facility] [place], [place], [place]

## 2022-04-27 ENCOUNTER — Telehealth: Payer: Self-pay | Admitting: Family Medicine

## 2022-04-27 DIAGNOSIS — I10 Essential (primary) hypertension: Secondary | ICD-10-CM

## 2022-04-28 NOTE — Telephone Encounter (Signed)
Pt's called, spoke with pt's dad. He was unavailable at this time. Advised dad to have pt call to schedule an appt for medication refill.

## 2022-04-28 NOTE — Telephone Encounter (Signed)
Courtesy refill Requested Prescriptions  Pending Prescriptions Disp Refills  . metoprolol succinate (TOPROL-XL) 100 MG 24 hr tablet [Pharmacy Med Name: METOPROLOL SUCC ER 100 MG TAB] 30 tablet 0    Sig: TAKE ONE TABLET BY MOUTH DAILY WITH OR IMMEDIATELY FOLLOWING A MEAL     Cardiovascular:  Beta Blockers Passed - 04/27/2022  9:28 AM      Passed - Last BP in normal range    BP Readings from Last 1 Encounters:  12/29/21 124/84         Passed - Last Heart Rate in normal range    Pulse Readings from Last 1 Encounters:  12/29/21 (!) 109         Passed - Valid encounter within last 6 months    Recent Outpatient Visits          4 months ago Essential hypertension   [facility] [place] Dense M, DO   10 months ago Essential hypertension   [facility] Caro Laroche, DO   1 year ago Essential hypertension   [facility] Danelle Berry, PA-C   1 year ago Essential hypertension   [facility] Jamelle Haring, MD   2 years ago Essential hypertension   [facility] [place] [facility] [place], [place], [place]

## 2022-05-28 ENCOUNTER — Telehealth: Payer: Self-pay | Admitting: Family Medicine

## 2022-05-28 NOTE — Telephone Encounter (Signed)
Tried to call pt to schedule, 1st # did not match pt and 2nd # no answer

## 2022-05-28 NOTE — Telephone Encounter (Signed)
Pt needs appt for further refills. 

## 2022-05-29 MED ORDER — METOPROLOL SUCCINATE ER 100 MG PO TB24: ORAL_TABLET | ORAL | 0 refills | Status: DC

## 2022-05-29 NOTE — Telephone Encounter (Signed)
Sure- please send in

## 2022-05-29 NOTE — Addendum Note (Signed)
Addended by: Forde Radon on: 05/29/2022 01:51 PM   Modules accepted: Orders

## 2022-05-29 NOTE — Telephone Encounter (Signed)
Pt has an appt on 06/10/22

## 2022-05-29 NOTE — Telephone Encounter (Signed)
Pts next ov is 9-06  Pt states he only has 3 days worth of medication left and inquiring if medication can be filled  Pease advise

## 2022-05-29 NOTE — Telephone Encounter (Signed)
Per Provider- extra courtesy refill sent 30 day supply patient needs to follow up before then for any more refills

## 2022-05-29 NOTE — Telephone Encounter (Signed)
Pt already had a courtesy refill but is scheduled till September. Would you approve another 30 day supply?

## 2022-06-10 ENCOUNTER — Encounter: Payer: Self-pay | Admitting: Family Medicine

## 2022-06-10 ENCOUNTER — Ambulatory Visit: Payer: Self-pay | Admitting: Family Medicine

## 2022-06-24 ENCOUNTER — Other Ambulatory Visit: Payer: Self-pay | Admitting: Family Medicine

## 2022-06-24 DIAGNOSIS — I10 Essential (primary) hypertension: Secondary | ICD-10-CM

## 2022-06-26 ENCOUNTER — Other Ambulatory Visit: Payer: Self-pay | Admitting: Family Medicine

## 2022-06-26 DIAGNOSIS — K219 Gastro-esophageal reflux disease without esophagitis: Secondary | ICD-10-CM

## 2022-06-26 NOTE — Telephone Encounter (Signed)
Requested Prescriptions  Pending Prescriptions Disp Refills  . esomeprazole (NEXIUM) 40 MG capsule [Pharmacy Med Name: ESOMEPRAZOLE MAG DR 40 MG CAP] 30 capsule     Sig: TAKE ONE CAPSULE BY MOUTH DAILY     Gastroenterology: Proton Pump Inhibitors 2 Passed - 06/26/2022  7:59 AM      Passed - ALT in normal range and within 360 days    ALT  Date Value Ref Range Status  12/29/2021 23 9 - 46 U/L Final   ALT (SGPT) Piccolo, Waived  Date Value Ref Range Status  11/06/2015 45 10 - 47 U/L Final         Passed - AST in normal range and within 360 days    AST  Date Value Ref Range Status  12/29/2021 19 10 - 35 U/L Final   AST (SGOT) Piccolo, Waived  Date Value Ref Range Status  11/06/2015 70 (H) 11 - 38 U/L Final         Passed - Valid encounter within last 12 months    Recent Outpatient Visits          5 months ago Essential hypertension   [facility] Myles Gip, DO   1 year ago Essential hypertension   [place], [place]   1 year ago Essential hypertension   [facility] Delsa Grana, PA-C   2 years ago Essential hypertension   [facility] Tow[place] Malkin, MD   2 years ago Essential hypertension   [place] Astrid Divine, [place]

## 2022-07-30 ENCOUNTER — Other Ambulatory Visit: Payer: Self-pay | Admitting: Family Medicine

## 2022-07-30 DIAGNOSIS — F3341 Major depressive disorder, recurrent, in partial remission: Secondary | ICD-10-CM

## 2022-07-30 DIAGNOSIS — F419 Anxiety disorder, unspecified: Secondary | ICD-10-CM

## 2022-07-30 DIAGNOSIS — F1010 Alcohol abuse, uncomplicated: Secondary | ICD-10-CM

## 2022-07-30 NOTE — Telephone Encounter (Signed)
Courtesy refill given, appointment needed.   Requested Prescriptions  Pending Prescriptions Disp Refills  . venlafaxine XR (EFFEXOR-XR) 150 MG 24 hr capsule [Pharmacy Med Name: VENLAFAXINE HCL ER 150 MG CAP] 30 capsule 0    Sig: TAKE ONE CAPSULE BY MOUTH DAILY. OFFICE VISIT NEEDED FOR ADDITIONAL REFILLS     Psychiatry: Antidepressants - SNRI - desvenlafaxine & venlafaxine Failed - 07/30/2022  6:22 AM      Failed - Cr in normal range and within 360 days    Creat  Date Value Ref Range Status  12/29/2021 0.65 (L) 0.70 - 1.30 mg/dL Final   Creatinine, Urine  Date Value Ref Range Status  03/23/2018 396 mg/dL Final         Failed - Valid encounter within last 6 months    Recent Outpatient Visits          7 months ago Essential hypertension   [place], DO   1 year ago Essential hypertension   [place], [place], DO   1 year ago Essential hypertension   [facility] Delsa Grana, PA-C   2 years ago Essential hypertension   [facility] Lebron Conners D, MD   2 years ago Essential hypertension   [place], Raquel Sarna E, [place]             Failed - Lipid Panel in normal range within the last 12 months    Cholesterol, Total  Date Value Ref Range Status  11/06/2015 224 (H) 100 - 199 mg/dL Final   Cholesterol  Date Value Ref Range Status  12/29/2021 233 (H) <200 mg/dL Final   LDL Cholesterol (Calc)  Date Value Ref Range Status  12/29/2021 138 (H) mg/dL (calc) Final    Comment:    Reference range: <100 . Desirable range <100 mg/dL for primary prevention;   <70 mg/dL for patients with CHD or diabetic patients  with > or = 2 CHD risk factors. Marland Kitchen LDL-C is now calculated using the Martin-Hopkins  calculation, which is a validated novel method providing  better accuracy than the Friedewald equation in the  estimation of LDL-C.  Cresenciano Genre et  al. Annamaria Helling. 8756;433(29): 2061-2068  (http://education.[facility].com/faq/FAQ164)    HDL  Date Value Ref Range Status  12/29/2021 52 > OR = 40 mg/dL Final  11/06/2015 74 >39 mg/dL Final   Triglycerides  Date Value Ref Range Status  12/29/2021 282 (H) <150 mg/dL Final    Comment:    . If a non-fasting specimen was collected, consider repeat triglyceride testing on a fasting specimen if clinically indicated.  Yates Decamp et al. J. of Clin. Lipidol. 5188;4:166-063. Marland Kitchen          Passed - Completed PHQ-2 or PHQ-9 in the last 360 days      Passed - Last BP in normal range    BP Readings from Last 1 Encounters:  12/29/21 124/84

## 2022-07-30 NOTE — Telephone Encounter (Signed)
Patient called, left VM to return the call to the office to schedule an OV for follow up.  ? ?

## 2022-08-05 ENCOUNTER — Encounter: Payer: Self-pay | Admitting: Family Medicine

## 2022-08-05 ENCOUNTER — Ambulatory Visit (INDEPENDENT_AMBULATORY_CARE_PROVIDER_SITE_OTHER): Payer: Self-pay | Admitting: Family Medicine

## 2022-08-05 VITALS — BP 136/90 | HR 112 | Temp 98.6°F | Resp 16 | Ht 69.0 in | Wt 198.9 lb

## 2022-08-05 DIAGNOSIS — Z1211 Encounter for screening for malignant neoplasm of colon: Secondary | ICD-10-CM

## 2022-08-05 DIAGNOSIS — M25512 Pain in left shoulder: Secondary | ICD-10-CM

## 2022-08-05 DIAGNOSIS — F419 Anxiety disorder, unspecified: Secondary | ICD-10-CM

## 2022-08-05 DIAGNOSIS — M25511 Pain in right shoulder: Secondary | ICD-10-CM

## 2022-08-05 DIAGNOSIS — E785 Hyperlipidemia, unspecified: Secondary | ICD-10-CM

## 2022-08-05 DIAGNOSIS — I10 Essential (primary) hypertension: Secondary | ICD-10-CM

## 2022-08-05 DIAGNOSIS — G8929 Other chronic pain: Secondary | ICD-10-CM

## 2022-08-05 DIAGNOSIS — Z23 Encounter for immunization: Secondary | ICD-10-CM

## 2022-08-05 DIAGNOSIS — F339 Major depressive disorder, recurrent, unspecified: Secondary | ICD-10-CM

## 2022-08-05 DIAGNOSIS — F3341 Major depressive disorder, recurrent, in partial remission: Secondary | ICD-10-CM

## 2022-08-05 DIAGNOSIS — R7301 Impaired fasting glucose: Secondary | ICD-10-CM

## 2022-08-05 DIAGNOSIS — F1010 Alcohol abuse, uncomplicated: Secondary | ICD-10-CM

## 2022-08-05 DIAGNOSIS — K219 Gastro-esophageal reflux disease without esophagitis: Secondary | ICD-10-CM

## 2022-08-05 MED ORDER — ESOMEPRAZOLE MAGNESIUM 40 MG PO CPDR: 40.0000 mg | DELAYED_RELEASE_CAPSULE | Freq: Every day | ORAL | 11 refills | Status: DC

## 2022-08-05 MED ORDER — MELOXICAM 15 MG PO TABS: 7.5000 mg | ORAL_TABLET | Freq: Every day | ORAL | 5 refills | Status: DC

## 2022-08-05 MED ORDER — METOPROLOL SUCCINATE ER 100 MG PO TB24: ORAL_TABLET | ORAL | 5 refills | Status: DC

## 2022-08-05 MED ORDER — AMLODIPINE BESYLATE 10 MG PO TABS: 10.0000 mg | ORAL_TABLET | Freq: Every day | ORAL | 5 refills | Status: DC

## 2022-08-05 MED ORDER — VENLAFAXINE HCL ER 150 MG PO CP24: ORAL_CAPSULE | ORAL | 11 refills | Status: DC

## 2022-08-05 MED ORDER — BUSPIRONE HCL 15 MG PO TABS: 15.0000 mg | ORAL_TABLET | Freq: Three times a day (TID) | ORAL | 2 refills | Status: DC | PRN

## 2022-08-05 NOTE — Progress Notes (Unsigned)
Name: [name] White   MRN: [id number]    DOB: [birth year]   Date:08/05/2022       Progress Note  Chief Complaint  Patient presents with   Follow-up   Hypertension    Pt has not taken metoprolol BP pill due to being out for +4 days, pt mentioned he took today 2 of the Amlodipine pills today   Gastroesophageal Reflux   Hyperlipidemia     Subjective:   [name] White is a 51 y.o. male, presents to clinic for routine f/up and med refills Cash pay, has run out of several meds Last labs from March this year   HLD not taking statin - Lab Results  Component Value Date   CHOL 233 (H) 12/29/2021   HDL 52 12/29/2021   LDLCALC 138 (H) 12/29/2021   TRIG 282 (H) 12/29/2021   CHOLHDL 4.5 12/29/2021   Psych/anxiety/mood worse as of last OV with med changes, advised to f/up in 1-3 months, has missed appts since then, now 8 months late here for f/up Was on effexor 150, seroquel added for insomnia and then buspar added, he has been out of meds for 2d feels awful, wants to get everything refiled, he is still dealing with a lot of stress at home, trying hard to move to a better situation because the stress is going to drive him to binge drink and meds help, but he needs to change his situation so that its easier to not self medicate to escape     08/05/2022    2:33 PM 12/29/2021    8:58 AM 06/04/2021    3:31 PM  Depression screen PHQ 2/9  Decreased Interest 1 0 1  Down, Depressed, Hopeless 1 0 1  PHQ - 2 Score 2 0 2  Altered sleeping 1 0 2  Tired, decreased energy 1 0 1  Change in appetite 0 0 0  Feeling bad or failure about yourself  0 0 0  Trouble concentrating 0 0 0  Moving slowly or fidgety/restless 0 0 0  Suicidal thoughts 0 0 0  PHQ-9 Score 4 0 5  Difficult doing work/chores Somewhat difficult Not difficult at all Somewhat difficult      08/05/2022    2:33 PM 12/29/2021    9:06 AM 06/04/2021    3:43 PM  GAD 7 : Generalized Anxiety Score  Nervous, Anxious, on Edge 1 3 1    Control/stop worrying 1 1 1   Worry too much - different things 1 1 3   Trouble relaxing 1 1 3   Restless 1 1 3   Easily annoyed or irritable 0 3 3  Afraid - awful might happen 0 1 0  Total GAD 7 Score 5 11 14   Anxiety Difficulty Somewhat difficult  Somewhat difficult   Hypertension:  Currently managed on metoprolol and amlodipine - ran out of metoprolol took two amlodipine today (20 mg)  BP mildly elevated, HR rapid on exam BP Readings from Last 3 Encounters:  08/05/22 (!) 136/90  12/29/21 124/84  06/04/21 140/86   Pulse Readings from Last 3 Encounters:  08/05/22 (!) 112  12/29/21 (!) 109  06/04/21 98  Having palpitations and tachycardia  Pt denies CP, SOB, exertional sx, LE edema, Ha's, visual disturbances, lightheadedness, hypotension, syncope.    Current Outpatient Medications:    amLODipine (NORVASC) 10 MG tablet, Take 1 tablet (10 mg total) by mouth daily., Disp: 90 tablet, Rfl: 1   atorvastatin (LIPITOR) 20 MG tablet, Take 1 tablet (20 mg total) by  mouth daily., Disp: 90 tablet, Rfl: 3   busPIRone (BUSPAR) 15 MG tablet, TAKE ONE TO TWO TABLETS BY MOUTH TWO TIMES A DAY AS NEEDED FOR ANXIETY, Disp: 120 tablet, Rfl: 0   esomeprazole (NEXIUM) 40 MG capsule, TAKE ONE CAPSULE BY MOUTH DAILY, Disp: 90 capsule, Rfl: 0   meloxicam (MOBIC) 15 MG tablet, Take 15 mg by mouth daily., Disp: , Rfl:    QUEtiapine (SEROQUEL) 25 MG tablet, Take 1 tablet (25 mg total) by mouth at bedtime., Disp: 90 tablet, Rfl: 1   varenicline (CHANTIX) 0.5 MG tablet, Days 1 to 3: 0.5 mg once daily. Days 4 to 7: 0.5 mg twice daily. Maintenance (day 8 and later): 1 mg twice daily, Disp: 100 tablet, Rfl: 0   metoprolol succinate (TOPROL-XL) 100 MG 24 hr tablet, TAKE 1 TABLET BY MOUTH DAILY WITH OR IMMEDIATELY FOLLOWING A MEAL COURTESY REFILL  ! PLEASE SCHEDULE AN APPOINTMENT FOR REFILLS (Patient not taking: Reported on 08/05/2022), Disp: 30 tablet, Rfl: 0   venlafaxine XR (EFFEXOR-XR) 150 MG 24 hr capsule, TAKE  ONE CAPSULE BY MOUTH DAILY. OFFICE VISIT NEEDED FOR ADDITIONAL REFILLS (Patient not taking: Reported on 08/05/2022), Disp: 30 capsule, Rfl: 0  Patient Active Problem List   Diagnosis Date Noted   Tobacco dependence 05/15/2020   Substance induced mood disorder (HCC) 04/07/2019   Abnormal nuclear stress test 12/31/2015   Snoring 11/06/2015   Elevated liver function tests 05/19/2015   Fatty liver 05/19/2015   Elevated serum GGT level 05/17/2015   IFG (impaired fasting glucose)    Hyperlipidemia    Essential hypertension    Depression    Anxiety    GERD (gastroesophageal reflux disease)    Overweight    History of alcohol use disorder    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   Avascular necrosis (HCC) 04/26/2013    Past Surgical History:  Procedure Laterality Date   CARDIAC CATHETERIZATION N/A 01/02/2016   Procedure: Left Heart Cath and Coronary Angiography;  Surgeon: Marykay Lex, MD;  Location: [facility] INVASIVE CV LAB;  Service: Cardiovascular;  Laterality: N/A;   CARPAL TUNNEL RELEASE Bilateral    right x 2, left x 1   DIALYSIS/PERMA CATHETER INSERTION N/A 03/23/2018   Procedure: DIALYSIS/PERMA CATHETER INSERTION;  Surgeon: Renford Dills, MD;  Location: [facility] INVASIVE CV LAB;  Service: Cardiovascular;  Laterality: N/A;   TESTICLE TORSION REDUCTION     TOTAL HIP ARTHROPLASTY Left 01/30/2014   Procedure: LEFT TOTAL HIP ARTHROPLASTY ANTERIOR APPROACH;  Surgeon: Shelda Pal, MD;  Location: [facility] ORS;  Service: Orthopedics;  Laterality: Left;   VASECTOMY  1995   VASECTOMY REVERSAL  1997    Family History  Problem Relation Age of Onset   Hypertension Mother    Diabetes Mother    Hypertension Father    Stroke Father    Cancer Neg Hx    COPD Neg Hx    Heart disease Neg Hx     Social History   Tobacco Use   Smoking status: Every Day    Packs/day: 1.00    Years: 30.00    Total pack years: 30.00    Types: Cigarettes   Smokeless tobacco: Never   Tobacco  comments:    discussed options   Vaping Use   Vaping Use: Never used  Substance Use Topics   Alcohol use: Not Currently    Comment: d/c ETOH 10 d ago 07/21/2019   Drug use: No    Comment: denies  Allergies  Allergen Reactions   Ace Inhibitors Anaphylaxis   Captopril Anaphylaxis   Enalapril Anaphylaxis   Fosinopril Anaphylaxis   Lisinopril Anaphylaxis   Ramipril Anaphylaxis    Health Maintenance  Topic Date Due   COLONOSCOPY (Pts 45-52yrs Insurance coverage will need to be confirmed)  Never done   Lung Cancer Screening  Never done   Hepatitis C Screening  Completed   HIV Screening  Completed   HPV VACCINES  Aged Out   INFLUENZA VACCINE  Discontinued   TETANUS/TDAP  Discontinued   COVID-19 Vaccine  Discontinued   Zoster Vaccines- Shingrix  Discontinued    Chart Review Today: I personally reviewed active problem list, medication list, allergies, family history, social history, health maintenance, notes from last encounter, lab results, imaging with the patient/caregiver today.   Review of Systems  Constitutional: Negative.   HENT: Negative.    Eyes: Negative.   Respiratory: Negative.  Negative for cough, chest tightness, shortness of breath and wheezing.   Cardiovascular:  Positive for palpitations. Negative for chest pain and leg swelling.  Gastrointestinal: Negative.   Endocrine: Negative.   Genitourinary: Negative.   Musculoskeletal: Negative.   Skin: Negative.   Allergic/Immunologic: Negative.   Neurological: Negative.   Hematological: Negative.   Psychiatric/Behavioral:  Positive for dysphoric mood. The patient is nervous/anxious.   All other systems reviewed and are negative.    Objective:   Vitals:   08/05/22 1437  BP: (!) 136/90  Pulse: (!) 112  Resp: 16  Temp: 98.6 F (37 C)  TempSrc: Oral  SpO2: 97%  Weight: 198 lb 14.4 oz (90.2 kg)  Height: 5\' 9"  (1.753 m)    Body mass index is 29.37 kg/m.  Physical Exam Vitals and nursing note  reviewed.  Constitutional:      General: He is not in acute distress.    Appearance: Normal appearance. He is well-developed. He is obese. He is not ill-appearing, toxic-appearing or diaphoretic.  HENT:     Head: Normocephalic and atraumatic.     Nose: Nose normal.  Eyes:     General:        Right eye: No discharge.        Left eye: No discharge.     Conjunctiva/sclera: Conjunctivae normal.  Neck:     Trachea: No tracheal deviation.  Cardiovascular:     Rate and Rhythm: Regular rhythm. Tachycardia present.     Pulses: Normal pulses.     Heart sounds: Normal heart sounds. No murmur heard.    No friction rub. No gallop.  Pulmonary:     Effort: Pulmonary effort is normal. No respiratory distress.     Breath sounds: No stridor.  Musculoskeletal:        General: Normal range of motion.  Skin:    General: Skin is warm and dry.     Coloration: Skin is not jaundiced or pale.     Findings: No rash.  Neurological:     Mental Status: He is alert. Mental status is at baseline.     Motor: No abnormal muscle tone.     Coordination: Coordination normal.  Psychiatric:        Attention and Perception: Attention and perception normal.        Mood and Affect: Mood is anxious and depressed. Mood is not elated. Affect is tearful. Affect is not blunt, flat, angry or inappropriate.        Speech: Speech normal.        Behavior: Behavior normal.  Behavior is cooperative.        Thought Content: Thought content normal. Thought content does not include homicidal or suicidal ideation. Thought content does not include homicidal or suicidal plan.        Cognition and Memory: Cognition and memory normal.         Assessment & Plan:     ICD-10-CM   1. Essential hypertension  I10 metoprolol succinate (TOPROL-XL) 100 MG 24 hr tablet    amLODipine (NORVASC) 10 MG tablet   elevated today, out of meds, tachy and mildly increased BP, he is also extremely upset, refills on all meds, recommend BP/pulse  recheck in office or virtual 2wk    2. Gastroesophageal reflux disease, unspecified whether esophagitis present  K21.9    see below    3. Episode of recurrent major depressive disorder, unspecified depression episode severity (HCC)  F33.9 venlafaxine XR (EFFEXOR-XR) 150 MG 24 hr capsule   worse mood with increased stressors and running out of meds, suspect some withdrawal sx from being out of effexor, overall denies SI/HI/AVH    4. Anxiety  F41.9 venlafaxine XR (EFFEXOR-XR) 150 MG 24 hr capsule    busPIRone (BUSPAR) 15 MG tablet   poorly controlled, but ran out of meds, refill all meds, reviewed med use, coping skills, could likely benefit from psychiatry/therapy    5. Hyperlipidemia, unspecified hyperlipidemia type  E78.5    not on statin, will defer labs to next OV/CPE    6. Screening for colon cancer  Z12.11    will having health insurance soon - encouraged him to do screening as soon as covered    7. IFG (impaired fasting glucose)  R73.01    recheck A1C when he returns/has insurance    8. Gastroesophageal reflux disease without esophagitis  K21.9 esomeprazole (NEXIUM) 40 MG capsule   poorly controlled due to ASA and NSAID use - continue daily ppi, can use pepcid as needed, decrease ASA, NSAID, ETOH    9. Alcohol abuse, episodic  F10.10 venlafaxine XR (EFFEXOR-XR) 150 MG 24 hr capsule   recurrent binge drinking episodes, refill on psych meds, pt in AA, working on changing situation, understands cardiovascular, pulm, GI risk and SE    10. Chronic pain of both shoulders  M25.511 meloxicam (MOBIC) 15 MG tablet   G89.29    M25.512    encouraged him to use mobic and tylenol, avoid excessive NSAIDs or goody powders - high risk for gastritis/GI bleed     Bp was repeated and did not improve - pt very upset today/anxious Plan to recheck Labs and vaccines/other HM deferred until he gets insurance      Return for 2-4 week nurse BP/HR f/up and 6 month routine f/up with labs.   Danelle Berry, PA-C 08/05/22 2:44 PM

## 2022-08-06 ENCOUNTER — Encounter: Payer: Self-pay | Admitting: Family Medicine

## 2022-08-06 DIAGNOSIS — F1010 Alcohol abuse, uncomplicated: Secondary | ICD-10-CM | POA: Insufficient documentation

## 2023-01-04 ENCOUNTER — Other Ambulatory Visit: Payer: Self-pay | Admitting: Family Medicine

## 2023-01-04 DIAGNOSIS — F419 Anxiety disorder, unspecified: Secondary | ICD-10-CM

## 2023-01-04 NOTE — Telephone Encounter (Signed)
Lvm to call and schedule followup for med refill

## 2023-01-04 NOTE — Telephone Encounter (Signed)
Need f/u appt. 

## 2023-01-25 ENCOUNTER — Other Ambulatory Visit: Payer: Self-pay | Admitting: Family Medicine

## 2023-01-25 DIAGNOSIS — I10 Essential (primary) hypertension: Secondary | ICD-10-CM

## 2023-01-25 NOTE — Telephone Encounter (Signed)
Medication Refill - Medication: metoprolol succinate (TOPROL-XL) 100 MG 24 hr tablet   Has the patient contacted their pharmacy? Yes.   Pharmacy calling.   (Agent: If yes, when and what did the pharmacy advise?)  Preferred Pharmacy (with phone number or street name):  [facility] [address]), [place] - [address]  [address]) [place] [address]  Phone: [phone] Fax: [phone]  Hours: Not open 24 hours   Has the patient been seen for an appointment in the last year OR does the patient have an upcoming appointment? No.  Agent: Please be advised that RX refills may take up to 3 business days. We ask that you follow-up with your pharmacy.

## 2023-01-26 MED ORDER — METOPROLOL SUCCINATE ER 100 MG PO TB24: ORAL_TABLET | ORAL | 0 refills | Status: DC

## 2023-01-26 NOTE — Telephone Encounter (Signed)
Requested Prescriptions  Pending Prescriptions Disp Refills   metoprolol succinate (TOPROL-XL) 100 MG 24 hr tablet 90 tablet 0    Sig: TAKE 1 TABLET BY MOUTH DAILY WITH OR IMMEDIATELY FOLLOWING A MEAL COURTESY REFILL     Cardiovascular:  Beta Blockers Failed - 01/25/2023 12:03 PM      Failed - Last BP in normal range    BP Readings from Last 1 Encounters:  08/05/22 (!) 136/90         Failed - Last Heart Rate in normal range    Pulse Readings from Last 1 Encounters:  08/05/22 (!) 112         Passed - Valid encounter within last 6 months    Recent Outpatient Visits           5 months ago Essential hypertension   [facility] Danelle Berry, PA-C   1 year ago Essential hypertension   [facility] Caro Laroche, DO   1 year ago Essential hypertension   [facility] Caro Laroche, DO   2 years ago Essential hypertension   [facility] Danelle Berry, PA-C   2 years ago Essential hypertension   [facility] Jamelle Haring, MD       Future Appointments             Tomorrow Mecum, Oswaldo Conroy, PA-C [facility]

## 2023-01-27 ENCOUNTER — Encounter: Payer: Self-pay | Admitting: Physician Assistant

## 2023-01-27 ENCOUNTER — Ambulatory Visit (INDEPENDENT_AMBULATORY_CARE_PROVIDER_SITE_OTHER): Payer: BC Managed Care – PPO | Admitting: Physician Assistant

## 2023-01-27 VITALS — BP 160/92 | HR 100 | Temp 97.9°F | Resp 16 | Ht 69.0 in | Wt 201.3 lb

## 2023-01-27 DIAGNOSIS — F419 Anxiety disorder, unspecified: Secondary | ICD-10-CM | POA: Diagnosis not present

## 2023-01-27 DIAGNOSIS — F1010 Alcohol abuse, uncomplicated: Secondary | ICD-10-CM

## 2023-01-27 DIAGNOSIS — F331 Major depressive disorder, recurrent, moderate: Secondary | ICD-10-CM

## 2023-01-27 DIAGNOSIS — I10 Essential (primary) hypertension: Secondary | ICD-10-CM

## 2023-01-27 DIAGNOSIS — Z1211 Encounter for screening for malignant neoplasm of colon: Secondary | ICD-10-CM

## 2023-01-27 DIAGNOSIS — F17200 Nicotine dependence, unspecified, uncomplicated: Secondary | ICD-10-CM

## 2023-01-27 MED ORDER — CARIPRAZINE HCL 1.5 MG PO CAPS: 1.5000 mg | ORAL_CAPSULE | Freq: Every day | ORAL | 1 refills | Status: DC

## 2023-01-27 MED ORDER — BUSPIRONE HCL 15 MG PO TABS: 15.0000 mg | ORAL_TABLET | Freq: Three times a day (TID) | ORAL | 1 refills | Status: DC | PRN

## 2023-01-27 MED ORDER — VENLAFAXINE HCL ER 150 MG PO CP24: ORAL_CAPSULE | ORAL | 11 refills | Status: DC

## 2023-01-27 MED ORDER — AMLODIPINE BESYLATE 10 MG PO TABS: 10.0000 mg | ORAL_TABLET | Freq: Every day | ORAL | 5 refills | Status: DC

## 2023-01-27 NOTE — Patient Instructions (Addendum)
Your blood pressure was  elevated today.  If possible please take it at home using an electronic blood pressure cuff for the upper arm Record your blood pressure once per day and bring them back with you to your apt so we can make sure you are not developing high blood pressure.   I have sent in refills of your blood pressure medications but I want to make sure they are working effectively. Please return to the office in 6 weeks to discuss your blood pressure and bring your BP logs with you so we can review them.  I recommend trying compression stockings: measure your calf at the widest part and use your shoe size to get the correct size. I recommend getting some with 15-25 mmHg compression force for everyday wear. Put these on first thing in the morning and take off at night for best results.   Incorporating a minimum of 150 minutes (20-30 minutes per day) of moderate intensity physical activity can help improve your heart health and reduce the chances of high blood pressure and other cardiovascular risks. Incorporating a heart healthy diet can also help reduce the chances of heart attack and high cholesterol.   I would also like to see you in 6 weeks to discuss your depression and medication management to make sure we are effectively managing your symptoms.   I have added a medication called Vraylar to your depression medication regimen. Please take this once per day unless you have concerning side effects or an allergic reaction. If this happens please call us to let us know or go to the ED if you symptoms are severe.

## 2023-01-27 NOTE — Progress Notes (Addendum)
Acute Office Visit   Patient: [name] White   DOB: 1971-03-01   53 y.o. Male  MRN: [id number] Visit Date: 01/27/2023  Today's healthcare provider: Oswaldo Conroy Eman Morimoto, PA-C  Introduced myself to the patient as a Secondary school teacher and provided education on APPs in clinical practice.    Chief Complaint  Patient presents with   Medication Refill    Has not taken metoprolol due to being out    Subjective    Medication Refill Associated symptoms include fatigue. Pertinent negatives include no chest pain or headaches.   HPI     Medication Refill    Additional comments: Has not taken metoprolol due to being out       Last edited by Forde Radon, CMA on 01/27/2023  9:19 AM.       Hypertension: - Medications: metoprolol 100 mg PO QD, Amlodipine 10 mg PO QD - Compliance: Good compliance, he has been out of Metoprolol since yesterday  - Checking BP at home: sometimes checks at home  - Denies any SOB, CP, vision changes, LE edema, medication SEs, or symptoms of hypotension   DEPRESSION Reports significant stressors at home and in marriage that are impacting mood   Mood status: exacerbated Satisfied with current treatment?: no- reports he doesn't think the Venlafaxine is managing his symptoms very well He reports increased depressed mood and concerns at this time.  Symptom severity: moderate  Duration of current treatment : chronic Side effects: no Medication compliance: good compliance Psychotherapy/counseling: yes  he has an apt with therapist today at 2:00 pm  Previous psychiatric medications: buspar and effexor Depressed mood: yes Anxious mood: no Anhedonia: yes Significant weight loss or gain:  fluctuates depending on PO intake  Insomnia: yes  sleep maintenance and initiation issues  Fatigue: yes Feelings of worthlessness or guilt:  sometimes - depends on the way things are going at home  Impaired concentration/indecisiveness: no Suicidal ideations:  he reports  SI fluctuates with how things are going at home, states thoughts are more passive right now but has thought of a plan in the past. He denies having desire to act on thoughts at this time. "I've got 5 grandkids, I ain't going nowhere."    Recommend he develop a crisis plan with therapist to address this  Hopelessness: no Crying spells: no    01/27/2023    9:05 AM 08/05/2022    2:33 PM 12/29/2021    8:58 AM 06/04/2021    3:31 PM 08/19/2020    8:42 AM  Depression screen PHQ 2/9  Decreased Interest 2 1 0 1 0  Down, Depressed, Hopeless 2 1 0 1 0  PHQ - 2 Score 4 2 0 2 0  Altered sleeping 3 1 0 2   Tired, decreased energy 3 1 0 1   Change in appetite 0 0 0 0   Feeling bad or failure about yourself  1 0 0 0   Trouble concentrating 2 0 0 0   Moving slowly or fidgety/restless 0 0 0 0   Suicidal thoughts 1 0 0 0   PHQ-9 Score 14 4 0 5   Difficult doing work/chores Somewhat difficult Somewhat difficult Not difficult at all Somewhat difficult        01/27/2023    9:13 AM 08/05/2022    2:33 PM 12/29/2021    9:06 AM 06/04/2021    3:43 PM  GAD 7 : Generalized Anxiety Score  Nervous, Anxious, on Edge  1 1 3 1   Control/stop worrying 1 1 1 1   Worry too much - different things 1 1 1 3   Trouble relaxing 1 1 1 3   Restless 1 1 1 3   Easily annoyed or irritable 0 0 3 3  Afraid - awful might happen 0 0 1 0  Total GAD 7 Score 5 5 11 14   Anxiety Difficulty Somewhat difficult Somewhat difficult  Somewhat difficult        Medications: Outpatient Medications Prior to Visit  Medication Sig   esomeprazole (NEXIUM) 40 MG capsule Take 1 capsule (40 mg total) by mouth daily.   meloxicam (MOBIC) 15 MG tablet Take 0.5-1 tablets (7.5-15 mg total) by mouth daily.   [DISCONTINUED] amLODipine (NORVASC) 10 MG tablet Take 1 tablet (10 mg total) by mouth daily.   [DISCONTINUED] busPIRone (BUSPAR) 15 MG tablet TAKE ONE TABLET BY MOUTH THREE TIMES A DAY AS NEEDED   [DISCONTINUED] venlafaxine XR (EFFEXOR-XR) 150 MG 24  hr capsule TAKE ONE CAPSULE BY MOUTH DAILY. OFFICE VISIT NEEDED FOR ADDITIONAL REFILLS   metoprolol succinate (TOPROL-XL) 100 MG 24 hr tablet TAKE 1 TABLET BY MOUTH DAILY WITH OR IMMEDIATELY FOLLOWING A MEAL COURTESY REFILL (Patient not taking: Reported on 01/27/2023)   No facility-administered medications prior to visit.    Review of Systems  Constitutional:  Positive for fatigue.  Eyes:  Negative for photophobia and visual disturbance.  Respiratory:  Negative for shortness of breath and wheezing.   Cardiovascular:  Negative for chest pain, palpitations and leg swelling.  Neurological:  Negative for dizziness and headaches.  Psychiatric/Behavioral:  Positive for dysphoric mood.        Objective    BP (!) 160/92   Pulse 100   Temp 97.9 F (36.6 C) (Oral)   Resp 16   Ht 5\' 9"  (1.753 m)   Wt 201 lb 4.8 oz (91.3 kg)   SpO2 98%   BMI 29.73 kg/m    Physical Exam Vitals reviewed.  Constitutional:      General: He is awake.     Appearance: Normal appearance. He is well-developed and well-groomed.  HENT:     Head: Normocephalic and atraumatic.  Cardiovascular:     Rate and Rhythm: Normal rate and regular rhythm.     Pulses: Normal pulses.          Radial pulses are 2+ on the right side and 2+ on the left side.     Heart sounds: Normal heart sounds. No murmur heard.    No friction rub. No gallop.  Pulmonary:     Effort: Pulmonary effort is normal.     Breath sounds: Normal breath sounds.  Musculoskeletal:     Right lower leg: No edema.     Left lower leg: No edema.  Neurological:     Mental Status: He is alert.  Psychiatric:        Attention and Perception: Attention and perception normal.        Mood and Affect: Mood and affect normal.        Speech: Speech normal.        Behavior: Behavior normal. Behavior is cooperative.        Thought Content: Thought content normal.        Cognition and Memory: Cognition normal.        Judgment: Judgment normal.       No  results found for any visits on 01/27/23.  Assessment & Plan      Return in  about 6 weeks (around 03/10/2023) for HTN, Depression.      Problem List Items Addressed This Visit       Cardiovascular and Mediastinum   Essential hypertension - Primary (Chronic)    Chronic, historic condition Appears currently exacerbated due to running out of BP medications Refills provided of Amlodipine 10 mg PO QD, reviewed with patient that Metoprolol 100 mg PO QD refill was sent in to pharmacy by PCP the day prior to apt Recommend he restart regimen as prescribed as soon as he gets scripts filled Given exacerbation, I recommend that he return to office in 4-6 weeks to check BP and monitor progress Recommend he check BP daily at home and record results to review at follow up  Reviewed using compression stockings for dependent edema - recommendations provided in AVS       Relevant Medications   amLODipine (NORVASC) 10 MG tablet     Other   Depression    Chronic, ongoing condition Reports Venlafaxine does not seem to be managing symptoms and he does not think It has been effective for some time He is taking Venlafaxine 150 mg PO QD and Buspar 15 mg PO TID PRN for anxiety symptoms He denies concerns for side effects at this time He reports significant marital strife and stressors within home that are contributory to his depression  He reports efforts to move out are underway and has an apt with therapy services later today- encouraged him to continue this to augment medications Recommend adding Vraylar 1.5 mg PO QD to regimen to assist with depression and anxiety  We discussed that prior auth may be required for medication - will reassess once script is processed at pharmacy. Recommend follow up in 4-6 weeks to discuss response to changes and assess progress with regimen.       Relevant Medications   venlafaxine XR (EFFEXOR-XR) 150 MG 24 hr capsule   cariprazine (VRAYLAR) 1.5 MG capsule   busPIRone  (BUSPAR) 15 MG tablet   Anxiety    Chronic, historic condition GAD7 appears stable at this time He is using Buspar 15 mg PO PRN - taking daily in AM and as needed up to 3 times per day Reports mood concerns are more centered around depression at this time. Continue Venlafaxine 150 mg PO QD and will add Vraylar 1.5 mg PO QD to assist with depression and anxiety  Follow up in 4-6 weeks to assess response and progress with changes.       Relevant Medications   venlafaxine XR (EFFEXOR-XR) 150 MG 24 hr capsule   cariprazine (VRAYLAR) 1.5 MG capsule   busPIRone (BUSPAR) 15 MG tablet   Tobacco dependence    Patient does not report interest in desire to quit today Will submit referral to lung cancer screening for monitoring Continue to assess smoking habits and provide support for cessation once he is interested      Relevant Orders   Ambulatory Referral for Lung Cancer Scre   Other Visit Diagnoses     Screening for colon cancer       Relevant Orders   Ambulatory referral to Gastroenterology      Addendum 02/02/23:  Leafy Kindle is not covered well by patient's insurance even after prior authorization. Will increase Venlafaxine as discussed during apt to assist with depressed mood and anxiety. Will dc Venlafaxine 150 mg PO QD and start Venlafaxine 225 mg PO QD. Keep follow up in 6 weeks for monitoring.   Return in about 6 weeks (  around 03/10/2023) for HTN, Depression.   I, Kivon Aprea E Manika Hast, PA-C, have reviewed all documentation for this visit. The documentation on 01/28/23 for the exam, diagnosis, procedures, and orders are all accurate and complete.   Jacquelin Hawking, MHS, PA-C [facility]

## 2023-01-28 ENCOUNTER — Other Ambulatory Visit: Payer: Self-pay | Admitting: Family Medicine

## 2023-01-28 DIAGNOSIS — I10 Essential (primary) hypertension: Secondary | ICD-10-CM

## 2023-01-28 NOTE — Assessment & Plan Note (Signed)
Chronic, ongoing condition Reports Venlafaxine does not seem to be managing symptoms and he does not think It has been effective for some time He is taking Venlafaxine 150 mg PO QD and Buspar 15 mg PO TID PRN for anxiety symptoms He denies concerns for side effects at this time He reports significant marital strife and stressors within home that are contributory to his depression  He reports efforts to move out are underway and has an apt with therapy services later today- encouraged him to continue this to augment medications Recommend adding Vraylar 1.5 mg PO QD to regimen to assist with depression and anxiety  We discussed that prior auth may be required for medication - will reassess once script is processed at pharmacy. Recommend follow up in 4-6 weeks to discuss response to changes and assess progress with regimen.

## 2023-01-28 NOTE — Assessment & Plan Note (Signed)
Chronic, historic condition GAD7 appears stable at this time He is using Buspar 15 mg PO PRN - taking daily in AM and as needed up to 3 times per day Reports mood concerns are more centered around depression at this time. Continue Venlafaxine 150 mg PO QD and will add Vraylar 1.5 mg PO QD to assist with depression and anxiety  Follow up in 4-6 weeks to assess response and progress with changes.

## 2023-01-28 NOTE — Assessment & Plan Note (Signed)
Patient does not report interest in desire to quit today Will submit referral to lung cancer screening for monitoring Continue to assess smoking habits and provide support for cessation once he is interested

## 2023-01-28 NOTE — Assessment & Plan Note (Signed)
Chronic, historic condition Appears currently exacerbated due to running out of BP medications Refills provided of Amlodipine 10 mg PO QD, reviewed with patient that Metoprolol 100 mg PO QD refill was sent in to pharmacy by PCP the day prior to apt Recommend he restart regimen as prescribed as soon as he gets scripts filled Given exacerbation, I recommend that he return to office in 4-6 weeks to check BP and monitor progress Recommend he check BP daily at home and record results to review at follow up  Reviewed using compression stockings for dependent edema - recommendations provided in AVS

## 2023-01-29 ENCOUNTER — Telehealth: Payer: Self-pay | Admitting: Family Medicine

## 2023-01-29 NOTE — Telephone Encounter (Signed)
Pt is calling in because he was told the medication cariprazine (VRAYLAR) 1.5 MG capsule [[id number]] would cost $1400/month and he doesn't want to pay that. Pt is wondering if PCP could send in something different that his insurance would cover and send it to [id number] - [place] ([address]), [place] - [address] . Pt is also requesting a follow up call if and when the medication is sent.

## 2023-01-29 NOTE — Telephone Encounter (Signed)
The patient called back stating his insurance will cover generic Latuda which is Lurasidone. He would like that called into his pharmacy at   [facility] [address] ([address]), [place] - 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 Please assist patient further

## 2023-01-29 NOTE — Telephone Encounter (Signed)
Called pt back and notified him to call insurance and ask what alternatives do they cover instead of Vraylar. Pt verbalized understanding and will call his insurance and will get back with [place].

## 2023-02-01 NOTE — Telephone Encounter (Signed)
Has a prior authorization been initiated yet? He and I discussed this during his apt and that he may have to wait a few days for [place] to get the PA finalized and sent through.

## 2023-02-02 MED ORDER — VENLAFAXINE HCL ER 225 MG PO TB24
225.0000 mg | ORAL_TABLET | Freq: Every day | ORAL | 2 refills | Status: DC
Start: 2023-02-02 — End: 2023-05-27

## 2023-02-02 NOTE — Telephone Encounter (Signed)
PA was done no need of PA since its covered through insurance, but patient has to pay 1,400.00 out of pocket. Insurance won't cover much

## 2023-02-02 NOTE — Addendum Note (Signed)
Addended by: Jacquelin Hawking on: 02/02/2023 02:23 PM   Modules accepted: Orders

## 2023-02-02 NOTE — Telephone Encounter (Signed)
No answer from pt left vm to call back office due to med dose changed.

## 2023-02-03 ENCOUNTER — Encounter: Payer: Self-pay | Admitting: *Deleted

## 2023-02-10 NOTE — Telephone Encounter (Signed)
Pt called back the office and was notified of med dose change. He verbalized understanding

## 2023-02-17 ENCOUNTER — Ambulatory Visit: Payer: BC Managed Care – PPO | Admitting: Podiatry

## 2023-02-24 ENCOUNTER — Ambulatory Visit (INDEPENDENT_AMBULATORY_CARE_PROVIDER_SITE_OTHER): Payer: BC Managed Care – PPO | Admitting: Podiatry

## 2023-02-24 DIAGNOSIS — Z91199 Patient's noncompliance with other medical treatment and regimen due to unspecified reason: Secondary | ICD-10-CM

## 2023-02-24 NOTE — Progress Notes (Signed)
Patient was no-show for appointment today 

## 2023-03-10 ENCOUNTER — Ambulatory Visit: Payer: BC Managed Care – PPO | Admitting: Family Medicine

## 2023-03-10 DIAGNOSIS — F331 Major depressive disorder, recurrent, moderate: Secondary | ICD-10-CM

## 2023-03-10 DIAGNOSIS — I10 Essential (primary) hypertension: Secondary | ICD-10-CM

## 2023-03-16 ENCOUNTER — Ambulatory Visit: Payer: BC Managed Care – PPO | Admitting: Family Medicine

## 2023-03-16 ENCOUNTER — Encounter: Payer: Self-pay | Admitting: Family Medicine

## 2023-04-13 ENCOUNTER — Other Ambulatory Visit: Payer: Self-pay | Admitting: Physician Assistant

## 2023-04-13 DIAGNOSIS — F419 Anxiety disorder, unspecified: Secondary | ICD-10-CM

## 2023-04-13 NOTE — Telephone Encounter (Signed)
Requested Prescriptions  Pending Prescriptions Disp Refills   busPIRone (BUSPAR) 15 MG tablet [Pharmacy Med Name: busPIRone HCl 15 MG Oral Tablet] 270 tablet 0    Sig: Take 1 tablet by mouth three times daily as needed     Psychiatry: Anxiolytics/Hypnotics - Non-controlled Passed - 04/13/2023  6:51 AM      Passed - Valid encounter within last 12 months    Recent Outpatient Visits           2 months ago Essential hypertension   [facility] Mecum, Oswaldo Conroy, PA-C   8 months ago Essential hypertension    [facility] Danelle Berry, PA-C   1 year ago Essential hypertension   [facility] Caro Laroche, DO   1 year ago Essential hypertension   [facility] Caro Laroche, DO   2 years ago Essential hypertension   [facility] Danelle Berry, [place]

## 2023-04-27 ENCOUNTER — Other Ambulatory Visit: Payer: Self-pay | Admitting: Family Medicine

## 2023-04-27 DIAGNOSIS — I10 Essential (primary) hypertension: Secondary | ICD-10-CM

## 2023-04-27 NOTE — Telephone Encounter (Signed)
Medication Refill - Medication: metoprolol succinate (TOPROL-XL) 100 MG 24 hr tablet   Has the patient contacted their pharmacy? Yes.   [facility] [address], [place] - [address]  [address] [place] [address]  Phone: [phone] Fax: [phone]  Hours: Not open 24 hours   Preferred Pharmacy (with phone number or street name):    Has the patient been seen for an appointment in the last year OR does the patient have an upcoming appointment? Yes.    Agent: Please be advised that RX refills may take up to 3 business days. We ask that you follow-up with your pharmacy.

## 2023-04-28 ENCOUNTER — Telehealth: Payer: Self-pay | Admitting: Family Medicine

## 2023-04-28 DIAGNOSIS — I10 Essential (primary) hypertension: Secondary | ICD-10-CM

## 2023-04-28 MED ORDER — METOPROLOL SUCCINATE ER 100 MG PO TB24
ORAL_TABLET | ORAL | 0 refills | Status: DC
Start: 2023-04-28 — End: 2023-07-27

## 2023-04-28 NOTE — Telephone Encounter (Signed)
Requested Prescriptions  Pending Prescriptions Disp Refills   metoprolol succinate (TOPROL-XL) 100 MG 24 hr tablet 90 tablet 0    Sig: TAKE 1 TABLET BY MOUTH DAILY WITH OR IMMEDIATELY FOLLOWING A MEAL COURTESY REFILL     Cardiovascular:  Beta Blockers Failed - 04/27/2023 10:18 AM      Failed - Last BP in normal range    BP Readings from Last 1 Encounters:  01/27/23 (!) 160/92         Passed - Last Heart Rate in normal range    Pulse Readings from Last 1 Encounters:  01/27/23 100         Passed - Valid encounter within last 6 months    Recent Outpatient Visits           3 months ago Essential hypertension   [facility] Mecum, Oswaldo Conroy, PA-C   8 months ago Essential hypertension   [facility] Danelle Berry, PA-C   1 year ago Essential hypertension   [facility] Caro Laroche, DO   1 year ago Essential hypertension   [facility] Caro Laroche, DO   2 years ago Essential hypertension   [facility] Danelle Berry, [place]

## 2023-04-28 NOTE — Telephone Encounter (Signed)
Medication Refill - Medication: metoprolol succinate (TOPROL-XL) 100 MG 24 hr tablet   Has the patient contacted their pharmacy? Yes.    Preferred Pharmacy (with phone number or street name):  [facility] [place], [place] - [address] Phone: [phone]  Fax: [phone]     Has the patient been seen for an appointment in the last year OR does the patient have an upcoming appointment? Yes.    Agent: Please be advised that RX refills may take up to 3 business days. We ask that you follow-up with your pharmacy.

## 2023-04-28 NOTE — Telephone Encounter (Signed)
Already requested on 04/27/23 refill encounter, this is a duplicate request.

## 2023-05-24 ENCOUNTER — Other Ambulatory Visit: Payer: Self-pay | Admitting: Physician Assistant

## 2023-05-24 DIAGNOSIS — F331 Major depressive disorder, recurrent, moderate: Secondary | ICD-10-CM

## 2023-05-24 DIAGNOSIS — F419 Anxiety disorder, unspecified: Secondary | ICD-10-CM

## 2023-05-25 NOTE — Telephone Encounter (Signed)
Requested medication (s) are due for refill today: yes  Requested medication (s) are on the active medication list: yes    Last refill: 02/02/23  #30  2 refills  Future visit scheduled no  Notes to clinic:  Failed due to labs, please review. Thank you.  Requested Prescriptions  Pending Prescriptions Disp Refills   Venlafaxine HCl 225 MG TB24 [Pharmacy Med Name: Venlafaxine HCl ER 225 MG Oral Tablet Extended Release 24 Hour] 30 tablet 0    Sig: Take 1 tablet by mouth once daily     Psychiatry: Antidepressants - SNRI - desvenlafaxine & venlafaxine Failed - 05/24/2023 10:41 AM      Failed - Cr in normal range and within 360 days    Creat  Date Value Ref Range Status  12/29/2021 0.65 (L) 0.70 - 1.30 mg/dL Final   Creatinine, Urine  Date Value Ref Range Status  03/23/2018 396 mg/dL Final         Failed - Last BP in normal range    BP Readings from Last 1 Encounters:  01/27/23 (!) 160/92         Failed - Lipid Panel in normal range within the last 12 months    Cholesterol, Total  Date Value Ref Range Status  11/06/2015 224 (H) 100 - 199 mg/dL Final   Cholesterol  Date Value Ref Range Status  12/29/2021 233 (H) <200 mg/dL Final   LDL Cholesterol (Calc)  Date Value Ref Range Status  12/29/2021 138 (H) mg/dL (calc) Final    Comment:    Reference range: <100 . Desirable range <100 mg/dL for primary prevention;   <70 mg/dL for patients with CHD or diabetic patients  with > or = 2 CHD risk factors. Marland Kitchen LDL-C is now calculated using the Martin-Hopkins  calculation, which is a validated novel method providing  better accuracy than the Friedewald equation in the  estimation of LDL-C.  Horald Pollen et al. Lenox Ahr. 4696;295(28): 2061-2068  (http://education.Quest[employer].com/faq/FAQ164)    HDL  Date Value Ref Range Status  12/29/2021 52 > OR = 40 mg/dL Final  41/32/4401 74 >02 mg/dL Final   Triglycerides  Date Value Ref Range Status  12/29/2021 282 (H) <150 mg/dL Final     Comment:    . If a non-fasting specimen was collected, consider repeat triglyceride testing on a fasting specimen if clinically indicated.  Perry Mount et al. J. of Clin. Lipidol. 2015;9:129-169. Marland Kitchen          Passed - Completed PHQ-2 or PHQ-9 in the last 360 days      Passed - Valid encounter within last 6 months    Recent Outpatient Visits           3 months ago Essential hypertension   [facility] Mecum, Oswaldo Conroy, PA-C   9 months ago Essential hypertension   [facility] Danelle Berry, PA-C   1 year ago Essential hypertension   [facility] Caro Laroche, DO   1 year ago Essential hypertension   [facility] Caro Laroche, DO   2 years ago Essential hypertension   [facility] Danelle Berry, [place]

## 2023-05-27 NOTE — Telephone Encounter (Signed)
Appt sch'd for 08/25/2023 with Sheliah Mends

## 2023-07-27 ENCOUNTER — Other Ambulatory Visit: Payer: Self-pay | Admitting: Family Medicine

## 2023-07-27 DIAGNOSIS — I10 Essential (primary) hypertension: Secondary | ICD-10-CM

## 2023-08-09 NOTE — Telephone Encounter (Signed)
Copied from CRM [id number]. Topic: General - Other >> Aug 09, 2023 11:54 AM Everette C wrote: Reason for CRM: Marguerita Merles with [facility] [employer] has called for diagnosis verification for the patient   Please contact further when possible at [phone]   Reference # [id number]

## 2023-08-25 ENCOUNTER — Ambulatory Visit: Payer: BC Managed Care – PPO | Admitting: Physician Assistant

## 2023-08-26 ENCOUNTER — Ambulatory Visit (INDEPENDENT_AMBULATORY_CARE_PROVIDER_SITE_OTHER): Payer: MEDICAID | Admitting: Physician Assistant

## 2023-08-26 ENCOUNTER — Encounter: Payer: Self-pay | Admitting: Physician Assistant

## 2023-08-26 VITALS — BP 150/96 | HR 89 | Temp 98.1°F | Resp 16 | Ht 69.0 in | Wt 201.2 lb

## 2023-08-26 DIAGNOSIS — F17200 Nicotine dependence, unspecified, uncomplicated: Secondary | ICD-10-CM | POA: Diagnosis not present

## 2023-08-26 DIAGNOSIS — M25511 Pain in right shoulder: Secondary | ICD-10-CM

## 2023-08-26 DIAGNOSIS — K219 Gastro-esophageal reflux disease without esophagitis: Secondary | ICD-10-CM

## 2023-08-26 DIAGNOSIS — F331 Major depressive disorder, recurrent, moderate: Secondary | ICD-10-CM | POA: Diagnosis not present

## 2023-08-26 DIAGNOSIS — F419 Anxiety disorder, unspecified: Secondary | ICD-10-CM

## 2023-08-26 DIAGNOSIS — Z1211 Encounter for screening for malignant neoplasm of colon: Secondary | ICD-10-CM | POA: Diagnosis not present

## 2023-08-26 DIAGNOSIS — M25512 Pain in left shoulder: Secondary | ICD-10-CM

## 2023-08-26 DIAGNOSIS — G8929 Other chronic pain: Secondary | ICD-10-CM

## 2023-08-26 DIAGNOSIS — E782 Mixed hyperlipidemia: Secondary | ICD-10-CM | POA: Diagnosis not present

## 2023-08-26 DIAGNOSIS — I10 Essential (primary) hypertension: Secondary | ICD-10-CM

## 2023-08-26 DIAGNOSIS — R7301 Impaired fasting glucose: Secondary | ICD-10-CM

## 2023-08-26 DIAGNOSIS — K760 Fatty (change of) liver, not elsewhere classified: Secondary | ICD-10-CM

## 2023-08-26 MED ORDER — BUSPIRONE HCL 15 MG PO TABS: 15.0000 mg | ORAL_TABLET | Freq: Three times a day (TID) | ORAL | 1 refills | Status: DC | PRN

## 2023-08-26 MED ORDER — AMLODIPINE BESYLATE 10 MG PO TABS: 10.0000 mg | ORAL_TABLET | Freq: Every day | ORAL | 1 refills | Status: DC

## 2023-08-26 MED ORDER — METOPROLOL SUCCINATE ER 100 MG PO TB24
ORAL_TABLET | ORAL | 0 refills | Status: DC
Start: 2023-08-26 — End: 2024-01-07

## 2023-08-26 MED ORDER — ESOMEPRAZOLE MAGNESIUM 40 MG PO CPDR: 40.0000 mg | DELAYED_RELEASE_CAPSULE | Freq: Every day | ORAL | 1 refills | Status: DC

## 2023-08-26 MED ORDER — MELOXICAM 15 MG PO TABS: 7.5000 mg | ORAL_TABLET | Freq: Every day | ORAL | 1 refills | Status: DC

## 2023-08-26 MED ORDER — VARENICLINE TARTRATE 0.5 MG PO TABS
ORAL_TABLET | ORAL | 0 refills | Status: AC
Start: 2023-08-26 — End: 2023-09-22

## 2023-08-26 MED ORDER — LOSARTAN POTASSIUM 25 MG PO TABS
25.0000 mg | ORAL_TABLET | Freq: Every day | ORAL | 1 refills | Status: DC
Start: 2023-08-26 — End: 2023-09-23

## 2023-08-26 MED ORDER — VENLAFAXINE HCL ER 225 MG PO TB24: 1.0000 | ORAL_TABLET | Freq: Every day | ORAL | 1 refills | Status: DC

## 2023-08-26 NOTE — Progress Notes (Addendum)
Established Patient Office Visit  Name: [name] White   MRN: [id number]    DOB: 1971-10-02   Date:08/29/2023  Today's Provider: Jacquelin Hawking, MHS, PA-C Introduced myself to the patient as a PA-C and provided education on APPs in clinical practice.         Subjective  Chief Complaint  Chief Complaint  Patient presents with   Hyperlipidemia   Hypertension   Anxiety   Medication Refill    HPI  HYPERTENSION / HYPERLIPIDEMIA Satisfied with current treatment? yes Duration of hypertension: years BP monitoring frequency: not checking BP range:  BP medication side effects: no Past BP meds: Amlodipine 10 mg PO every day, Metoprolol 100 mg PO every day  Duration of hyperlipidemia: years Cholesterol medication side effects: NA Cholesterol supplements: niacin Past cholesterol medications: none Medication compliance: NA Aspirin: no Recent stressors: no Recurrent headaches: no Visual changes: no Palpitations: no Dyspnea: no Chest pain: no Lower extremity edema: no Dizzy/lightheaded: no  He reports he has stopped drinking  Stopped around June   Depression/Anxiety  He reports minimal changes with increased Venlafaxine dose  He has been using Buspar at least once per day in the AM and then as needed throughout the day  He has also been taking Niacin  He thinks Buspar has been helping with anxiety flares       08/26/2023   10:34 AM 01/27/2023    9:05 AM 08/05/2022    2:33 PM 12/29/2021    8:58 AM 06/04/2021    3:31 PM  Depression screen PHQ 2/9  Decreased Interest 2 2 1  0 1  Down, Depressed, Hopeless 2 2 1  0 1  PHQ - 2 Score 4 4 2  0 2  Altered sleeping 0 3 1 0 2  Tired, decreased energy 2 3 1  0 1  Change in appetite 0 0 0 0 0  Feeling bad or failure about yourself  0 1 0 0 0  Trouble concentrating 1 2 0 0 0  Moving slowly or fidgety/restless 0 0 0 0 0  Suicidal thoughts 0 1 0 0 0  PHQ-9 Score 7 14 4  0 5  Difficult doing work/chores Very difficult Somewhat  difficult Somewhat difficult Not difficult at all Somewhat difficult      08/26/2023   10:41 AM 01/27/2023    9:13 AM 08/05/2022    2:33 PM 12/29/2021    9:06 AM  GAD 7 : Generalized Anxiety Score  Nervous, Anxious, on Edge 2 1 1 3   Control/stop worrying 2 1 1 1   Worry too much - different things 2 1 1 1   Trouble relaxing 2 1 1 1   Restless 2 1 1 1   Easily annoyed or irritable 2 0 0 3  Afraid - awful might happen 2 0 0 1  Total GAD 7 Score 14 5 5 11   Anxiety Difficulty Very difficult Somewhat difficult Somewhat difficult         Patient Active Problem List   Diagnosis Date Noted   Alcohol abuse, episodic 08/06/2022   Tobacco dependence 05/15/2020   Substance induced mood disorder (HCC) 04/07/2019   Abnormal nuclear stress test 12/31/2015   Snoring 11/06/2015   Elevated liver function tests 05/19/2015   Fatty liver 05/19/2015   Elevated serum GGT level 05/17/2015   IFG (impaired fasting glucose)    Hyperlipidemia    Essential hypertension    Depression    Anxiety    GERD (gastroesophageal reflux disease)  Overweight    History of alcohol use disorder    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   Avascular necrosis (HCC) 04/26/2013    Past Surgical History:  Procedure Laterality Date   CARDIAC CATHETERIZATION N/A 01/02/2016   Procedure: Left Heart Cath and Coronary Angiography;  Surgeon: Marykay Lex, MD;  Location: [facility] INVASIVE CV LAB;  Service: Cardiovascular;  Laterality: N/A;   CARPAL TUNNEL RELEASE Bilateral    right x 2, left x 1   DIALYSIS/PERMA CATHETER INSERTION N/A 03/23/2018   Procedure: DIALYSIS/PERMA CATHETER INSERTION;  Surgeon: Renford Dills, MD;  Location: [facility] INVASIVE CV LAB;  Service: Cardiovascular;  Laterality: N/A;   TESTICLE TORSION REDUCTION     TOTAL HIP ARTHROPLASTY Left 01/30/2014   Procedure: LEFT TOTAL HIP ARTHROPLASTY ANTERIOR APPROACH;  Surgeon: Shelda Pal, MD;  Location: [facility] ORS;  Service: Orthopedics;  Laterality:  Left;   VASECTOMY  1995   VASECTOMY REVERSAL  1997    Family History  Problem Relation Age of Onset   Hypertension Mother    Diabetes Mother    Hypertension Father    Stroke Father    Cancer Neg Hx    COPD Neg Hx    Heart disease Neg Hx     Social History   Tobacco Use   Smoking status: Every Day    Current packs/day: 1.00    Average packs/day: 1 pack/day for 30.0 years (30.0 ttl pk-yrs)    Types: Cigarettes   Smokeless tobacco: Never   Tobacco comments:    discussed options   Substance Use Topics   Alcohol use: Not Currently    Comment: d/c ETOH 10 d ago 07/21/2019     Current Outpatient Medications:    losartan (COZAAR) 25 MG tablet, Take 1 tablet (25 mg total) by mouth daily., Disp: 30 tablet, Rfl: 1   varenicline (CHANTIX) 0.5 MG tablet, Take 1 tablet (0.5 mg total) by mouth daily for 3 days, THEN 1 tablet (0.5 mg total) 2 (two) times daily for 4 days, THEN 2 tablets (1 mg total) 2 (two) times daily for 21 days., Disp: 95 tablet, Rfl: 0   amLODipine (NORVASC) 10 MG tablet, Take 1 tablet (10 mg total) by mouth daily., Disp: 90 tablet, Rfl: 1   busPIRone (BUSPAR) 15 MG tablet, Take 1 tablet (15 mg total) by mouth 3 (three) times daily as needed., Disp: 270 tablet, Rfl: 1   esomeprazole (NEXIUM) 40 MG capsule, Take 1 capsule (40 mg total) by mouth daily., Disp: 90 capsule, Rfl: 1   meloxicam (MOBIC) 15 MG tablet, Take 0.5-1 tablets (7.5-15 mg total) by mouth daily., Disp: 30 tablet, Rfl: 1   metoprolol succinate (TOPROL-XL) 100 MG 24 hr tablet, TAKE 1 TABLET BY MOUTH ONCE DAILY WITH OR IMMEDIATELY FOLLOWING A MEAL, Disp: 90 tablet, Rfl: 0   Venlafaxine HCl 225 MG TB24, Take 1 tablet (225 mg total) by mouth daily., Disp: 90 tablet, Rfl: 1  Allergies  Allergen Reactions   Ace Inhibitors Anaphylaxis   Captopril Anaphylaxis   Enalapril Anaphylaxis   Fosinopril Anaphylaxis   Lisinopril Anaphylaxis   Ramipril Anaphylaxis    I personally reviewed active problem list,  medication list, allergies, health maintenance, notes from last encounter, lab results with the patient/caregiver today.   Review of Systems  Eyes:  Negative for blurred vision, double vision and photophobia.  Respiratory:  Negative for shortness of breath and wheezing.   Cardiovascular:  Negative for chest pain, palpitations and leg swelling.  Neurological:  Negative for dizziness and headaches.  Psychiatric/Behavioral:  The patient is nervous/anxious.       Objective  Vitals:   08/26/23 1034  BP: (!) 150/96  Pulse: 89  Resp: 16  Temp: 98.1 F (36.7 C)  TempSrc: Oral  SpO2: 96%  Weight: 201 lb 3.2 oz (91.3 kg)  Height: 5\' 9"  (1.753 m)    Body mass index is 29.71 kg/m.  Physical Exam Vitals reviewed.  Constitutional:      General: He is awake.     Appearance: Normal appearance. He is well-developed and well-groomed.  HENT:     Head: Normocephalic and atraumatic.  Cardiovascular:     Rate and Rhythm: Normal rate and regular rhythm.     Pulses: Normal pulses.          Radial pulses are 2+ on the right side and 2+ on the left side.     Heart sounds: Normal heart sounds. No murmur heard.    No friction rub. No gallop.  Pulmonary:     Effort: Pulmonary effort is normal.     Breath sounds: Normal breath sounds. No decreased air movement. No decreased breath sounds, wheezing, rhonchi or rales.  Musculoskeletal:     Cervical back: Normal range of motion.     Right lower leg: No edema.     Left lower leg: No edema.  Neurological:     General: No focal deficit present.     Mental Status: He is alert and oriented to person, place, and time. Mental status is at baseline.     GCS: GCS eye subscore is 4. GCS verbal subscore is 5. GCS motor subscore is 6.  Psychiatric:        Attention and Perception: Attention and perception normal.        Mood and Affect: Mood and affect normal.        Speech: Speech normal.        Behavior: Behavior normal. Behavior is cooperative.         Thought Content: Thought content normal.        Cognition and Memory: Cognition normal.      Recent Results (from the past 2160 hour(s))  COMPLETE METABOLIC PANEL WITH GFR     Status: Abnormal   Collection Time: 08/26/23 11:44 AM  Result Value Ref Range   Glucose, Bld 120 (H) 65 - 99 mg/dL    Comment: .            Fasting reference interval . For someone without known diabetes, a glucose value between 100 and 125 mg/dL is consistent with prediabetes and should be confirmed with a follow-up test. .    BUN 15 7 - 25 mg/dL   Creat 9.62 9.52 - 8.41 mg/dL   eGFR 324 > OR = 60 MW/NUU/7.25D6   BUN/Creatinine Ratio SEE NOTE: 6 - 22 (calc)    Comment:    Not Reported: BUN and Creatinine are within    reference range. .    Sodium 138 135 - 146 mmol/L   Potassium 4.6 3.5 - 5.3 mmol/L   Chloride 101 98 - 110 mmol/L   CO2 29 20 - 32 mmol/L   Calcium 10.2 8.6 - 10.3 mg/dL   Total Protein 7.4 6.1 - 8.1 g/dL   Albumin 4.8 3.6 - 5.1 g/dL   Globulin 2.6 1.9 - 3.7 g/dL (calc)   AG Ratio 1.8 1.0 - 2.5 (calc)   Total Bilirubin 0.4 0.2 - 1.2 mg/dL  Alkaline phosphatase (APISO) 82 35 - 144 U/L   AST 18 10 - 35 U/L   ALT 23 9 - 46 U/L  CBC w/Diff/Platelet     Status: None   Collection Time: 08/26/23 11:44 AM  Result Value Ref Range   WBC 7.9 3.8 - 10.8 Thousand/uL   RBC 5.10 4.20 - 5.80 Million/uL   Hemoglobin 16.0 13.2 - 17.1 g/dL   HCT 16.1 09.6 - 04.5 %   MCV 90.0 80.0 - 100.0 fL   MCH 31.4 27.0 - 33.0 pg   MCHC 34.9 32.0 - 36.0 g/dL    Comment: For adults, a slight decrease in the calculated MCHC value (in the range of 30 to 32 g/dL) is most likely not clinically significant; however, it should be interpreted with caution in correlation with other red cell parameters and the patient's clinical condition.    RDW 12.7 11.0 - 15.0 %   Platelets 292 140 - 400 Thousand/uL   MPV 11.5 7.5 - 12.5 fL   Neutro Abs 4,558 1,500 - 7,800 cells/uL   Absolute Lymphocytes 2,575 850 -  3,900 cells/uL   Absolute Monocytes 537 200 - 950 cells/uL   Eosinophils Absolute 158 15 - 500 cells/uL   Basophils Absolute 71 0 - 200 cells/uL   Neutrophils Relative % 57.7 %   Total Lymphocyte 32.6 %   Monocytes Relative 6.8 %   Eosinophils Relative 2.0 %   Basophils Relative 0.9 %  Lipid Profile     Status: Abnormal   Collection Time: 08/26/23 11:44 AM  Result Value Ref Range   Cholesterol 221 (H) <200 mg/dL   HDL 46 > OR = 40 mg/dL   Triglycerides 409 (H) <150 mg/dL    Comment: . If a non-fasting specimen was collected, consider repeat triglyceride testing on a fasting specimen if clinically indicated.  Perry Mount et al. J. of Clin. Lipidol. 2015;9:129-169. Marland Kitchen    LDL Cholesterol (Calc) 129 (H) mg/dL (calc)    Comment: Reference range: <100 . Desirable range <100 mg/dL for primary prevention;   <70 mg/dL for patients with CHD or diabetic patients  with > or = 2 CHD risk factors. Marland Kitchen LDL-C is now calculated using the Martin-Hopkins  calculation, which is a validated novel method providing  better accuracy than the Friedewald equation in the  estimation of LDL-C.  Horald Pollen et al. Lenox Ahr. 8119;147(82): 2061-2068  (http://education.[facility].com/faq/FAQ164)    Total CHOL/HDL Ratio 4.8 <5.0 (calc)   Non-HDL Cholesterol (Calc) 175 (H) <130 mg/dL (calc)    Comment: For patients with diabetes plus 1 major ASCVD risk  factor, treating to a non-HDL-C goal of <100 mg/dL  (LDL-C of <95 mg/dL) is considered a therapeutic  option.   HgB A1c     Status: Abnormal   Collection Time: 08/26/23 11:44 AM  Result Value Ref Range   Hgb A1c MFr Bld 6.6 (H) <5.7 % of total Hgb    Comment: For someone without known diabetes, a hemoglobin A1c value of 6.5% or greater indicates that they may have  diabetes and this should be confirmed with a follow-up  test. . For someone with known diabetes, a value <7% indicates  that their diabetes is well controlled and a value  greater than or  equal to 7% indicates suboptimal  control. A1c targets should be individualized based on  duration of diabetes, age, comorbid conditions, and  other considerations. . Currently, no consensus exists regarding use of hemoglobin A1c for diagnosis of diabetes for children. Marland Kitchen  Mean Plasma Glucose 143 mg/dL   eAG (mmol/L) 7.9 mmol/L     PHQ2/9:    08/26/2023   10:34 AM 01/27/2023    9:05 AM 08/05/2022    2:33 PM 12/29/2021    8:58 AM 06/04/2021    3:31 PM  Depression screen PHQ 2/9  Decreased Interest 2 2 1  0 1  Down, Depressed, Hopeless 2 2 1  0 1  PHQ - 2 Score 4 4 2  0 2  Altered sleeping 0 3 1 0 2  Tired, decreased energy 2 3 1  0 1  Change in appetite 0 0 0 0 0  Feeling bad or failure about yourself  0 1 0 0 0  Trouble concentrating 1 2 0 0 0  Moving slowly or fidgety/restless 0 0 0 0 0  Suicidal thoughts 0 1 0 0 0  PHQ-9 Score 7 14 4  0 5  Difficult doing work/chores Very difficult Somewhat difficult Somewhat difficult Not difficult at all Somewhat difficult      Fall Risk:    08/26/2023   10:34 AM 01/27/2023    9:04 AM 08/05/2022    2:33 PM 12/29/2021    8:56 AM 06/04/2021    3:31 PM  Fall Risk   Falls in the past year? 0 0 0 0 0  Number falls in past yr: 0 0 0 0 0  Injury with Fall? 0 0 0 0 0  Risk for fall due to : No Fall Risks No Fall Risks No Fall Risks    Follow up Falls prevention discussed;Education provided;Falls evaluation completed Falls prevention discussed;Education provided;Falls evaluation completed Falls prevention discussed;Education provided;Falls evaluation completed        Functional Status Survey: Is the patient deaf or have difficulty hearing?: No Does the patient have difficulty seeing, even when wearing glasses/contacts?: No Does the patient have difficulty concentrating, remembering, or making decisions?: No Does the patient have difficulty walking or climbing stairs?: No Does the patient have difficulty dressing or bathing?: No Does the  patient have difficulty doing errands alone such as visiting a doctor's office or shopping?: No    Assessment & Plan  Problem List Items Addressed This Visit       Cardiovascular and Mediastinum   Essential hypertension (Chronic)    Chronic, historic condition Appears mildly exacerbated today despite taking amlodipine 10 mg p.o. daily and metoprolol 100 mg p.o. daily Will try adding losartan 25 mg p.o. daily to assist with management and provide kidney protection We discussed his previous allergies to ACE inhibitors and steps to take if he does have an allergic reaction to starting ARB therapy Recommend checking blood pressure at home with able and bringing log to next appointment Follow-up in 4 weeks or sooner if concerns arise       Relevant Medications   losartan (COZAAR) 25 MG tablet   metoprolol succinate (TOPROL-XL) 100 MG 24 hr tablet   amLODipine (NORVASC) 10 MG tablet   Other Relevant Orders   COMPLETE METABOLIC PANEL WITH GFR (Completed)   CBC w/Diff/Platelet (Completed)     Digestive   GERD (gastroesophageal reflux disease)    Chronic, historic condition Appears well-managed on current regimen comprised of esomeprazole 40 mg p.o. daily Refills provided today Continue current regimen Follow-up in about 6 months or sooner if concerns arise      Relevant Medications   esomeprazole (NEXIUM) 40 MG capsule   Fatty liver    Recheck liver enzymes today Patient reports that he has stopped drinking and  has been sober since June He has been taking niacin to help with cessation efforts according to recommendations from the sponsor Congratulated on cessation.  Recommend he continues with sobriety      Relevant Orders   COMPLETE METABOLIC PANEL WITH GFR (Completed)     Endocrine   IFG (impaired fasting glucose) (Chronic)    Recheck A1c Results to dictate further management      Relevant Orders   HgB A1c (Completed)     Other   Hyperlipidemia (Chronic)     Chronic, historic condition He does not appear to be taking cholesterol medication at this time but is taking niacin supplement per patient report Recheck lipid panel Results to dictate further management      Relevant Medications   losartan (COZAAR) 25 MG tablet   metoprolol succinate (TOPROL-XL) 100 MG 24 hr tablet   amLODipine (NORVASC) 10 MG tablet   Other Relevant Orders   Lipid Profile (Completed)   Depression    Chronic, ongoing concern Patient has started increased venlafaxine dose and is taking  225 mg p.o. daily.  He appears to be tolerating well but reports minimal subjective changes with increased dose. PHQ-9 has improved since last visit-down to 7 Suspect that anxiety is having significant impact on depression so will try increasing BuSpar 15 mg p.o. 3 times daily as needed to see if this helps improve symptoms May need to try adjunct again if insurance will cover but hesitant to do this as he wants to start Chantix for smoking cessation.  Will potentially try adjunct after smoking cessation has concluded for further stabilization Follow-up in about 6 weeks or sooner if concerns arise       Relevant Medications   Venlafaxine HCl 225 MG TB24   busPIRone (BUSPAR) 15 MG tablet   Anxiety    Chronic, ongoing GAD-7 reviewed with him today-current score 14 which is increased from previous Will try increasing BuSpar dosing to 3 times a day to assist with management Continue venlafaxine  225 p.o. daily. May need to add adjunct but will wait to see the response to Chantix before adding further medications at this time Follow-up in 6 weeks or sooner if concerns arise      Relevant Medications   Venlafaxine HCl 225 MG TB24   busPIRone (BUSPAR) 15 MG tablet   Tobacco dependence    Chronic, ongoing He is trying to decrease quantity per day but is not fully ready to stop  Discussed cessation techniques, nicotine replacement, chantix, cessation support organizations  Smoking  about a pack every 2 days  He is interested in Chantix- reviewed quitting recommendations and provided patient education materials.  Reviewed most common side effects with patient, allowing him time to ask questions and voiced concerns. Chantix starter pack sent in today Entire cessation conversation lasted approx 11 minutes of apt time  Follow-up in about 4 weeks to assess response or sooner if concerns arise      Relevant Medications   varenicline (CHANTIX) 0.5 MG tablet   Other Relevant Orders   Ambulatory Referral for Lung Cancer Scre   Other Visit Diagnoses     Screening for colon cancer    -  Primary   Relevant Orders   Ambulatory referral to Gastroenterology   Chronic pain of both shoulders       Relevant Medications   Venlafaxine HCl 225 MG TB24   meloxicam (MOBIC) 15 MG tablet        Return in about 4 weeks (  around 09/23/2023) for Annual Physical, HTN.   I, Lukisha Procida E Davison Ohms, PA-C, have reviewed all documentation for this visit. The documentation on 08/29/23 for the exam, diagnosis, procedures, and orders are all accurate and complete.   Jacquelin Hawking, MHS, PA-C [facility]

## 2023-08-26 NOTE — Assessment & Plan Note (Addendum)
Chronic, ongoing He is trying to decrease quantity per day but is not fully ready to stop  Discussed cessation techniques, nicotine replacement, chantix, cessation support organizations  Smoking about a pack every 2 days  He is interested in Chantix- reviewed quitting recommendations and provided patient education materials.  Reviewed most common side effects with patient, allowing him time to ask questions and voiced concerns. Chantix starter pack sent in today Entire cessation conversation lasted approx 11 minutes of apt time  Follow-up in about 4 weeks to assess response or sooner if concerns arise

## 2023-08-27 LAB — CBC WITH DIFFERENTIAL/PLATELET
Absolute Lymphocytes: 2575 {cells}/uL (ref 850–3900)
Absolute Monocytes: 537 {cells}/uL (ref 200–950)
Basophils Absolute: 71 {cells}/uL (ref 0–200)
Basophils Relative: 0.9 %
Eosinophils Absolute: 158 {cells}/uL (ref 15–500)
Eosinophils Relative: 2 %
HCT: 45.9 % (ref 38.5–50.0)
Hemoglobin: 16 g/dL (ref 13.2–17.1)
MCH: 31.4 pg (ref 27.0–33.0)
MCHC: 34.9 g/dL (ref 32.0–36.0)
MCV: 90 fL (ref 80.0–100.0)
MPV: 11.5 fL (ref 7.5–12.5)
Monocytes Relative: 6.8 %
Neutro Abs: 4558 {cells}/uL (ref 1500–7800)
Neutrophils Relative %: 57.7 %
Platelets: 292 10*3/uL (ref 140–400)
RBC: 5.1 10*6/uL (ref 4.20–5.80)
RDW: 12.7 % (ref 11.0–15.0)
Total Lymphocyte: 32.6 %
WBC: 7.9 10*3/uL (ref 3.8–10.8)

## 2023-08-27 LAB — LIPID PANEL
Cholesterol: 221 mg/dL — ABNORMAL HIGH (ref <200)
HDL: 46 mg/dL (ref >=40)
LDL Cholesterol (Calc): 129 mg/dL — ABNORMAL HIGH
Non-HDL Cholesterol (Calc): 175 mg/dL — ABNORMAL HIGH (ref <130)
Total CHOL/HDL Ratio: 4.8 (calc) (ref <5.0)
Triglycerides: 321 mg/dL — ABNORMAL HIGH (ref <150)

## 2023-08-27 LAB — COMPLETE METABOLIC PANEL WITH GFR
AG Ratio: 1.8 (calc) (ref 1.0–2.5)
ALT: 23 U/L (ref 9–46)
AST: 18 U/L (ref 10–35)
Albumin: 4.8 g/dL (ref 3.6–5.1)
Alkaline phosphatase (APISO): 82 U/L (ref 35–144)
BUN: 15 mg/dL (ref 7–25)
CO2: 29 mmol/L (ref 20–32)
Calcium: 10.2 mg/dL (ref 8.6–10.3)
Chloride: 101 mmol/L (ref 98–110)
Creat: 0.79 mg/dL (ref 0.70–1.30)
Globulin: 2.6 g/dL (ref 1.9–3.7)
Glucose, Bld: 120 mg/dL — ABNORMAL HIGH (ref 65–99)
Potassium: 4.6 mmol/L (ref 3.5–5.3)
Sodium: 138 mmol/L (ref 135–146)
Total Bilirubin: 0.4 mg/dL (ref 0.2–1.2)
Total Protein: 7.4 g/dL (ref 6.1–8.1)
eGFR: 106 mL/min/{1.73_m2} (ref >=60)

## 2023-08-27 LAB — HEMOGLOBIN A1C
Hgb A1c MFr Bld: 6.6 %{Hb} — ABNORMAL HIGH (ref <5.7)
Mean Plasma Glucose: 143 mg/dL
eAG (mmol/L): 7.9 mmol/L

## 2023-08-29 NOTE — Assessment & Plan Note (Signed)
Recheck A1c Results to dictate further management

## 2023-08-29 NOTE — Assessment & Plan Note (Signed)
Recheck liver enzymes today Patient reports that he has stopped drinking and has been sober since June He has been taking niacin to help with cessation efforts according to recommendations from the sponsor Congratulated on cessation.  Recommend he continues with sobriety

## 2023-08-29 NOTE — Assessment & Plan Note (Signed)
Chronic, historic condition Appears mildly exacerbated today despite taking amlodipine 10 mg p.o. daily and metoprolol 100 mg p.o. daily Will try adding losartan 25 mg p.o. daily to assist with management and provide kidney protection We discussed his previous allergies to ACE inhibitors and steps to take if he does have an allergic reaction to starting ARB therapy Recommend checking blood pressure at home with able and bringing log to next appointment Follow-up in 4 weeks or sooner if concerns arise

## 2023-08-29 NOTE — Progress Notes (Signed)
Your labs are back Your electrolytes, liver and kidney function were overall normal at this time Your CBC is overall normal, no signs of anemia Your A1c was 6.6%.  This places you in diabetic range.  At this time medication is not required but it is very important that you reduce excess sugar and carbohydrates in your diet and make sure that you are exercising regularly to prevent further progression and the need to start a medication for diabetes management.  Please let us know if you have further questions or concerns Your cholesterol is elevated.  I recommend a trial of diet and exercise to see if we can control this without starting medication.  This would include reducing saturated fats in your daily diet and increasing "good" fats such as those from olive oil, tree nuts, avocado, etc.  I also recommend increasing your weekly exercise to include at least 150 minutes of moderate intensity physical activity.  If these measures do not improve your cholesterol over the next 6 months I recommend starting a medication to manage your cholesterol

## 2023-08-29 NOTE — Assessment & Plan Note (Signed)
Chronic, historic condition Appears well-managed on current regimen comprised of esomeprazole 40 mg p.o. daily Refills provided today Continue current regimen Follow-up in about 6 months or sooner if concerns arise

## 2023-08-29 NOTE — Assessment & Plan Note (Addendum)
Chronic, ongoing GAD-7 reviewed with him today-current score 14 which is increased from previous Will try increasing BuSpar dosing to 3 times a day to assist with management Continue venlafaxine  225 p.o. daily. May need to add adjunct but will wait to see the response to Chantix before adding further medications at this time Follow-up in 6 weeks or sooner if concerns arise

## 2023-08-29 NOTE — Assessment & Plan Note (Addendum)
Chronic, historic condition He does not appear to be taking cholesterol medication at this time but is taking niacin supplement per patient report Recheck lipid panel Results to dictate further management

## 2023-08-29 NOTE — Assessment & Plan Note (Addendum)
Chronic, ongoing concern Patient has started increased venlafaxine dose and is taking  225 mg p.o. daily.  He appears to be tolerating well but reports minimal subjective changes with increased dose. PHQ-9 has improved since last visit-down to 7 Suspect that anxiety is having significant impact on depression so will try increasing BuSpar 15 mg p.o. 3 times daily as needed to see if this helps improve symptoms May need to try adjunct again if insurance will cover but hesitant to do this as he wants to start Chantix for smoking cessation.  Will potentially try adjunct after smoking cessation has concluded for further stabilization Follow-up in about 6 weeks or sooner if concerns arise

## 2023-09-07 ENCOUNTER — Encounter: Payer: Self-pay | Admitting: *Deleted

## 2023-09-23 ENCOUNTER — Encounter: Payer: Self-pay | Admitting: Physician Assistant

## 2023-09-23 ENCOUNTER — Ambulatory Visit (INDEPENDENT_AMBULATORY_CARE_PROVIDER_SITE_OTHER): Payer: MEDICAID | Admitting: Physician Assistant

## 2023-09-23 VITALS — BP 128/78 | HR 84 | Resp 16 | Ht 69.0 in | Wt 205.0 lb

## 2023-09-23 DIAGNOSIS — G8929 Other chronic pain: Secondary | ICD-10-CM | POA: Insufficient documentation

## 2023-09-23 DIAGNOSIS — E785 Hyperlipidemia, unspecified: Secondary | ICD-10-CM | POA: Diagnosis not present

## 2023-09-23 DIAGNOSIS — Z7189 Other specified counseling: Secondary | ICD-10-CM | POA: Insufficient documentation

## 2023-09-23 DIAGNOSIS — F17200 Nicotine dependence, unspecified, uncomplicated: Secondary | ICD-10-CM

## 2023-09-23 DIAGNOSIS — Z125 Encounter for screening for malignant neoplasm of prostate: Secondary | ICD-10-CM

## 2023-09-23 DIAGNOSIS — Z0000 Encounter for general adult medical examination without abnormal findings: Secondary | ICD-10-CM | POA: Diagnosis not present

## 2023-09-23 DIAGNOSIS — F17210 Nicotine dependence, cigarettes, uncomplicated: Secondary | ICD-10-CM | POA: Diagnosis not present

## 2023-09-23 DIAGNOSIS — I10 Essential (primary) hypertension: Secondary | ICD-10-CM

## 2023-09-23 DIAGNOSIS — Z131 Encounter for screening for diabetes mellitus: Secondary | ICD-10-CM

## 2023-09-23 DIAGNOSIS — E782 Mixed hyperlipidemia: Secondary | ICD-10-CM

## 2023-09-23 DIAGNOSIS — M25511 Pain in right shoulder: Secondary | ICD-10-CM

## 2023-09-23 DIAGNOSIS — Z1329 Encounter for screening for other suspected endocrine disorder: Secondary | ICD-10-CM

## 2023-09-23 DIAGNOSIS — M25512 Pain in left shoulder: Secondary | ICD-10-CM

## 2023-09-23 MED ORDER — DICLOFENAC SODIUM 75 MG PO TBEC: 75.0000 mg | DELAYED_RELEASE_TABLET | Freq: Two times a day (BID) | ORAL | 0 refills | Status: DC

## 2023-09-23 MED ORDER — LOSARTAN POTASSIUM 25 MG PO TABS: 25.0000 mg | ORAL_TABLET | Freq: Every day | ORAL | 1 refills | Status: DC

## 2023-09-23 NOTE — Assessment & Plan Note (Signed)
A voluntary discussion about advance care planning including the explanation and discussion of advance directives was extensively discussed  with the patient for 3  minutes with patient and myself   present.  Explanation about the health care proxy and Living will was reviewed and packet with forms with explanation of how to fill them out was given.  During this discussion, the patient was able to identify a health care proxy as  his wife, [name] White and plans to fill out the paperwork required.  Patient was offered a separate Advance Care Planning visit for further assistance with forms.

## 2023-09-23 NOTE — Assessment & Plan Note (Signed)
Chronic, historic condition BP is in goal today in office and he reports feeling better at home He is currently taking amlodipine 10 mg p.o. daily, metoprolol 100 mg p.o. daily, losartan 25 mg p.o. daily and appears to be tolerating well Continue current regimen Recommend checking blood pressures at home when able and bring log to next appointment Follow-up in 6 months or sooner if concerns arise

## 2023-09-23 NOTE — Progress Notes (Signed)
Annual Physical Exam   Name: [name] White   MRN: [id number]    DOB: 06/11/1971   Date:09/23/2023  Today's Provider: Jacquelin Hawking, MHS, PA-C Introduced myself to the patient as a PA-C and provided education on APPs in clinical practice.         Subjective  Chief Complaint  Chief Complaint  Patient presents with   Annual Exam   Hypertension    HPI  Patient presents for annual CPE .   Diet: Thinks it is well balanced but is trying to reduce sugar intake  Exercise: he is doing a lot of walking at work  Sleep: "better" getting 6-8 hours per night and feels overall well rested in the AM but reports some issues with daytime drowsiness later in the day. He has been told that he snores while sleeping  Mood: "I'm better, pretty good"   He reports BP has been doing well at home  BP looks good today in office   He reports he is still having pain in shoulders despite Meloxicam use  States he is having to take about 4 BC powders per day for pain management   IPSS Questionnaire (AUA-7): Over the past month.   1)  How often have you had a sensation of not emptying your bladder completely after you finish urinating?  0 - Not at all  2)  How often have you had to urinate again less than two hours after you finished urinating? 0 - Not at all  3)  How often have you found you stopped and started again several times when you urinated?  0 - Not at all  4) How difficult have you found it to postpone urination?  0 - Not at all  5) How often have you had a weak urinary stream?  1 - Less than 1 time in 5  6) How often have you had to push or strain to begin urination?  0 - Not at all  7) How many times did you most typically get up to urinate from the time you went to bed until the time you got up in the morning?  1 - 1 time  Total score:  0-7 mildly symptomatic   8-19 moderately symptomatic   20-35 severely symptomatic       Depression: phq 9 is positive    08/26/2023   10:34  AM 01/27/2023    9:05 AM 08/05/2022    2:33 PM 12/29/2021    8:58 AM 06/04/2021    3:31 PM  Depression screen PHQ 2/9  Decreased Interest 2 2 1  0 1  Down, Depressed, Hopeless 2 2 1  0 1  PHQ - 2 Score 4 4 2  0 2  Altered sleeping 0 3 1 0 2  Tired, decreased energy 2 3 1  0 1  Change in appetite 0 0 0 0 0  Feeling bad or failure about yourself  0 1 0 0 0  Trouble concentrating 1 2 0 0 0  Moving slowly or fidgety/restless 0 0 0 0 0  Suicidal thoughts 0 1 0 0 0  PHQ-9 Score 7 14 4  0 5  Difficult doing work/chores Very difficult Somewhat difficult Somewhat difficult Not difficult at all Somewhat difficult    Hypertension:  BP Readings from Last 3 Encounters:  09/23/23 128/78  08/26/23 (!) 150/96  01/27/23 (!) 160/92    Obesity: Wt Readings from Last 3 Encounters:  09/23/23 205 lb (93 kg)  08/26/23  201 lb 3.2 oz (91.3 kg)  01/27/23 201 lb 4.8 oz (91.3 kg)   BMI Readings from Last 3 Encounters:  09/23/23 30.27 kg/m  08/26/23 29.71 kg/m  01/27/23 29.73 kg/m     Lipids:  Lab Results  Component Value Date   CHOL 221 (H) 08/26/2023   CHOL 233 (H) 12/29/2021   CHOL 235 (H) 06/04/2021   Lab Results  Component Value Date   HDL 46 08/26/2023   HDL 52 12/29/2021   HDL 41 06/04/2021   Lab Results  Component Value Date   LDLCALC 129 (H) 08/26/2023   LDLCALC 138 (H) 12/29/2021   LDLCALC 151 (H) 06/04/2021   Lab Results  Component Value Date   TRIG 321 (H) 08/26/2023   TRIG 282 (H) 12/29/2021   TRIG 264 (H) 06/04/2021   Lab Results  Component Value Date   CHOLHDL 4.8 08/26/2023   CHOLHDL 4.5 12/29/2021   CHOLHDL 5.7 (H) 06/04/2021   No results found for: "LDLDIRECT" Glucose:  Glucose, Bld  Date Value Ref Range Status  08/26/2023 120 (H) 65 - 99 mg/dL Final    Comment:    .            Fasting reference interval . For someone without known diabetes, a glucose value between 100 and 125 mg/dL is consistent with prediabetes and should be confirmed with  a follow-up test. .   12/29/2021 117 (H) 65 - 99 mg/dL Final    Comment:    .            Fasting reference interval . For someone without known diabetes, a glucose value between 100 and 125 mg/dL is consistent with prediabetes and should be confirmed with a follow-up test. .   06/04/2021 101 (H) 65 - 99 mg/dL Final    Comment:    .            Fasting reference interval . For someone without known diabetes, a glucose value between 100 and 125 mg/dL is consistent with prediabetes and should be confirmed with a follow-up test. .    Glucose-Capillary  Date Value Ref Range Status  07/12/2017 163 (H) 65 - 99 mg/dL Final    Flowsheet Row Office Visit from 11/02/2019 in [facility]  AUDIT-C Score 0        Married STD testing and prevention (HIV/chl/gon/syphilis):  no, declines screening today   Skin cancer: Discussed monitoring for atypical lesions Colorectal cancer screening: he is going to call and make apt - has new job so he is hesitant to take time right now  Prostate cancer screening:  ordered No results found for: "PSA"   Lung cancer:  Low Dose CT Chest recommended if Age 57-80 years, 30 pack-year currently smoking OR have quit w/in 15years. Patient  yes- orders placed in nov- he is aware of need and plans to schedule apt in the coming months  AAA: The USPSTF recommends one-time screening with ultrasonography in men ages 62 to 75 years who have ever smoked. Patient:  not applicable ECG:  NA  Health Maintenance  Topic Date Due   Colon Cancer Screening  Never done   Screening for Lung Cancer  Never done   Flu Shot  01/03/2024*   Hepatitis C Screening  Completed   HIV Screening  Completed   HPV Vaccine  Aged Out   DTaP/Tdap/Td vaccine  Discontinued   COVID-19 Vaccine  Discontinued   Zoster (Shingles) Vaccine  Discontinued  *Topic was  postponed. The date shown is not the original due date.     Advanced Care Planning: A voluntary  discussion about advance care planning including the explanation and discussion of advance directives.  Discussed health care proxy and Living will, and the patient was able to identify a health care proxy as his wife, Banyan Tracht.  Patient does not have a living will in effect at this time.  Patient Active Problem List   Diagnosis Date Noted   Advanced care planning/counseling discussion 09/23/2023   Chronic pain of both shoulders 09/23/2023   Alcohol abuse, episodic 08/06/2022   Tobacco dependence 05/15/2020   Substance induced mood disorder (HCC) 04/07/2019   Abnormal nuclear stress test 12/31/2015   Snoring 11/06/2015   Elevated liver function tests 05/19/2015   Fatty liver 05/19/2015   Elevated serum GGT level 05/17/2015   IFG (impaired fasting glucose)    Hyperlipidemia    Essential hypertension    Depression    Anxiety    GERD (gastroesophageal reflux disease)    Overweight    History of alcohol use disorder    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   Avascular necrosis (HCC) 04/26/2013    Past Surgical History:  Procedure Laterality Date   CARDIAC CATHETERIZATION N/A 01/02/2016   Procedure: Left Heart Cath and Coronary Angiography;  Surgeon: Marykay Lex, MD;  Location: [facility] INVASIVE CV LAB;  Service: Cardiovascular;  Laterality: N/A;   CARPAL TUNNEL RELEASE Bilateral    right x 2, left x 1   DIALYSIS/PERMA CATHETER INSERTION N/A 03/23/2018   Procedure: DIALYSIS/PERMA CATHETER INSERTION;  Surgeon: Renford Dills, MD;  Location: [facility] INVASIVE CV LAB;  Service: Cardiovascular;  Laterality: N/A;   TESTICLE TORSION REDUCTION     TOTAL HIP ARTHROPLASTY Left 01/30/2014   Procedure: LEFT TOTAL HIP ARTHROPLASTY ANTERIOR APPROACH;  Surgeon: Shelda Pal, MD;  Location: [facility] ORS;  Service: Orthopedics;  Laterality: Left;   VASECTOMY  1995   VASECTOMY REVERSAL  1997    Family History  Problem Relation Age of Onset   Hypertension Mother    Diabetes Mother     Hypertension Father    Stroke Father    Cancer Neg Hx    COPD Neg Hx    Heart disease Neg Hx     Social History   Socioeconomic History   Marital status: Married    Spouse name: Not on file   Number of children: 5   Years of education: 12   Highest education level: Not on file  Occupational History   Occupation: lawn care    Comment: [employer]  Tobacco Use   Smoking status: Every Day    Current packs/day: 1.00    Average packs/day: 1 pack/day for 30.0 years (30.0 ttl pk-yrs)    Types: Cigarettes   Smokeless tobacco: Never   Tobacco comments:    discussed options   Vaping Use   Vaping status: Never Used  Substance and Sexual Activity   Alcohol use: Not Currently    Comment: d/c ETOH 10 d ago 07/21/2019   Drug use: No    Comment: denies   Sexual activity: Not Currently    Birth control/protection: Surgical  Other Topics Concern   Not on file  Social History Narrative   epworth sleepiness scale = 6 (12/31/15)   Social Drivers of Health   Financial Resource Strain: Low Risk  (09/23/2023)   Overall Financial Resource Strain (CARDIA)    Difficulty of Paying  Living Expenses: Not hard at all  Food Insecurity: No Food Insecurity (09/23/2023)   Hunger Vital Sign    Worried About Running Out of Food in the Last Year: Never true    Ran Out of Food in the Last Year: Never true  Transportation Needs: No Transportation Needs (09/23/2023)   PRAPARE - Administrator, Civil Service (Medical): No    Lack of Transportation (Non-Medical): No  Physical Activity: Sufficiently Active (09/23/2023)   Exercise Vital Sign    Days of Exercise per Week: 5 days    Minutes of Exercise per Session: 60 min  Stress: No Stress Concern Present (09/23/2023)   [employer] of Occupational Health - Occupational Stress Questionnaire    Feeling of Stress : Not at all  Social Connections: Moderately Integrated (09/23/2023)   Social Connection and Isolation Panel [NHANES]     Frequency of Communication with Friends and Family: Twice a week    Frequency of Social Gatherings with Friends and Family: Once a week    Attends Religious Services: More than 4 times per year    Active Member of [employer] or Organizations: No    Attends Banker Meetings: Never    Marital Status: Married  Catering manager Violence: Not At Risk (09/23/2023)   Humiliation, Afraid, Rape, and Kick questionnaire    Fear of Current or Ex-Partner: No    Emotionally Abused: No    Physically Abused: No    Sexually Abused: No     Current Outpatient Medications:    amLODipine (NORVASC) 10 MG tablet, Take 1 tablet (10 mg total) by mouth daily., Disp: 90 tablet, Rfl: 1   busPIRone (BUSPAR) 15 MG tablet, Take 1 tablet (15 mg total) by mouth 3 (three) times daily as needed., Disp: 270 tablet, Rfl: 1   diclofenac (VOLTAREN) 75 MG EC tablet, Take 1 tablet (75 mg total) by mouth 2 (two) times daily., Disp: 60 tablet, Rfl: 0   esomeprazole (NEXIUM) 40 MG capsule, Take 1 capsule (40 mg total) by mouth daily., Disp: 90 capsule, Rfl: 1   metoprolol succinate (TOPROL-XL) 100 MG 24 hr tablet, TAKE 1 TABLET BY MOUTH ONCE DAILY WITH OR IMMEDIATELY FOLLOWING A MEAL, Disp: 90 tablet, Rfl: 0   Venlafaxine HCl 225 MG TB24, Take 1 tablet (225 mg total) by mouth daily., Disp: 90 tablet, Rfl: 1   losartan (COZAAR) 25 MG tablet, Take 1 tablet (25 mg total) by mouth daily., Disp: 90 tablet, Rfl: 1  Allergies  Allergen Reactions   Ace Inhibitors Anaphylaxis   Captopril Anaphylaxis   Enalapril Anaphylaxis   Fosinopril Anaphylaxis   Lisinopril Anaphylaxis   Ramipril Anaphylaxis     Review of Systems  Constitutional:  Negative for chills, fever, malaise/fatigue and weight loss.  HENT:  Positive for tinnitus. Negative for hearing loss, nosebleeds and sore throat.   Eyes:  Negative for blurred vision, double vision and photophobia.  Respiratory:  Negative for cough, shortness of breath and wheezing.    Cardiovascular:  Negative for chest pain, palpitations and leg swelling.  Gastrointestinal:  Positive for blood in stool (reports some bright red blood with wiping on ocassion), diarrhea and heartburn. Negative for constipation, nausea and vomiting.       Reports heartburn and diarrhea are contingent on dietary intake    Genitourinary:  Negative for dysuria and frequency.  Musculoskeletal:  Negative for falls, joint pain and myalgias.  Skin:  Negative for itching and rash.  Neurological:  Negative for dizziness, tingling,  tremors, loss of consciousness, weakness and headaches.  Psychiatric/Behavioral:  Negative for depression and memory loss. The patient is not nervous/anxious and does not have insomnia.        Objective  Vitals:   09/23/23 1035  BP: 128/78  Pulse: 84  Resp: 16  SpO2: 98%  Weight: 205 lb (93 kg)  Height: 5\' 9"  (1.753 m)    Body mass index is 30.27 kg/m.  Physical Exam Vitals reviewed.  Constitutional:      General: He is awake.     Appearance: Normal appearance. He is well-developed and well-groomed.  HENT:     Head: Normocephalic and atraumatic.     Right Ear: Hearing, tympanic membrane and ear canal normal.     Left Ear: Hearing, tympanic membrane and ear canal normal.     Nose: Nose normal.     Mouth/Throat:     Lips: Pink.     Mouth: Mucous membranes are moist. No lacerations or oral lesions.     Pharynx: Oropharynx is clear. Uvula midline. No pharyngeal swelling, oropharyngeal exudate or posterior oropharyngeal erythema.  Eyes:     General: Lids are normal. Gaze aligned appropriately.     Extraocular Movements: Extraocular movements intact.     Right eye: Normal extraocular motion and no nystagmus.     Left eye: Normal extraocular motion and no nystagmus.     Conjunctiva/sclera: Conjunctivae normal.     Pupils: Pupils are equal, round, and reactive to light.  Neck:     Thyroid: No thyroid mass, thyromegaly or thyroid tenderness.     Trachea:  Phonation normal.  Cardiovascular:     Rate and Rhythm: Normal rate and regular rhythm.     Pulses: Normal pulses.          Radial pulses are 2+ on the right side and 2+ on the left side.     Heart sounds: Normal heart sounds. No murmur heard.    No friction rub. No gallop.  Pulmonary:     Effort: Pulmonary effort is normal.     Breath sounds: Normal breath sounds. No decreased air movement. No decreased breath sounds, wheezing, rhonchi or rales.  Abdominal:     General: Abdomen is flat. Bowel sounds are normal.     Palpations: Abdomen is soft.     Tenderness: There is no abdominal tenderness.  Musculoskeletal:     Cervical back: Normal range of motion and neck supple.     Right lower leg: No edema.     Left lower leg: No edema.  Lymphadenopathy:     Head:     Right side of head: No submental, submandibular or preauricular adenopathy.     Left side of head: No submental, submandibular or preauricular adenopathy.     Cervical: No cervical adenopathy.     Right cervical: No superficial or posterior cervical adenopathy.    Left cervical: No superficial or posterior cervical adenopathy.     Upper Body:     Right upper body: No supraclavicular adenopathy.     Left upper body: No supraclavicular adenopathy.  Skin:    General: Skin is warm and dry.  Neurological:     General: No focal deficit present.     Mental Status: He is alert and oriented to person, place, and time. Mental status is at baseline.     GCS: GCS eye subscore is 4. GCS verbal subscore is 5. GCS motor subscore is 6.     Cranial Nerves: No cranial  nerve deficit, dysarthria or facial asymmetry.     Motor: No weakness, tremor, atrophy or abnormal muscle tone.     Gait: Gait is intact.     Deep Tendon Reflexes:     Reflex Scores:      Patellar reflexes are 2+ on the right side and 2+ on the left side. Psychiatric:        Attention and Perception: Attention and perception normal.        Mood and Affect: Mood and affect  normal.        Speech: Speech normal.        Behavior: Behavior normal. Behavior is cooperative.        Thought Content: Thought content normal.        Cognition and Memory: Cognition normal.        Judgment: Judgment normal.      Recent Results (from the past 2160 hours)  COMPLETE METABOLIC PANEL WITH GFR     Status: Abnormal   Collection Time: 08/26/23 11:44 AM  Result Value Ref Range   Glucose, Bld 120 (H) 65 - 99 mg/dL    Comment: .            Fasting reference interval . For someone without known diabetes, a glucose value between 100 and 125 mg/dL is consistent with prediabetes and should be confirmed with a follow-up test. .    BUN 15 7 - 25 mg/dL   Creat 2.59 5.63 - 8.75 mg/dL   eGFR 643 > OR = 60 PI/RJJ/8.84Z6   BUN/Creatinine Ratio SEE NOTE: 6 - 22 (calc)    Comment:    Not Reported: BUN and Creatinine are within    reference range. .    Sodium 138 135 - 146 mmol/L   Potassium 4.6 3.5 - 5.3 mmol/L   Chloride 101 98 - 110 mmol/L   CO2 29 20 - 32 mmol/L   Calcium 10.2 8.6 - 10.3 mg/dL   Total Protein 7.4 6.1 - 8.1 g/dL   Albumin 4.8 3.6 - 5.1 g/dL   Globulin 2.6 1.9 - 3.7 g/dL (calc)   AG Ratio 1.8 1.0 - 2.5 (calc)   Total Bilirubin 0.4 0.2 - 1.2 mg/dL   Alkaline phosphatase (APISO) 82 35 - 144 U/L   AST 18 10 - 35 U/L   ALT 23 9 - 46 U/L  CBC w/Diff/Platelet     Status: None   Collection Time: 08/26/23 11:44 AM  Result Value Ref Range   WBC 7.9 3.8 - 10.8 Thousand/uL   RBC 5.10 4.20 - 5.80 Million/uL   Hemoglobin 16.0 13.2 - 17.1 g/dL   HCT 60.6 30.1 - 60.1 %   MCV 90.0 80.0 - 100.0 fL   MCH 31.4 27.0 - 33.0 pg   MCHC 34.9 32.0 - 36.0 g/dL    Comment: For adults, a slight decrease in the calculated MCHC value (in the range of 30 to 32 g/dL) is most likely not clinically significant; however, it should be interpreted with caution in correlation with other red cell parameters and the patient's clinical condition.    RDW 12.7 11.0 - 15.0 %    Platelets 292 140 - 400 Thousand/uL   MPV 11.5 7.5 - 12.5 fL   Neutro Abs 4,558 1,500 - 7,800 cells/uL   Absolute Lymphocytes 2,575 850 - 3,900 cells/uL   Absolute Monocytes 537 200 - 950 cells/uL   Eosinophils Absolute 158 15 - 500 cells/uL   Basophils Absolute 71 0 - 200 cells/uL  Neutrophils Relative % 57.7 %   Total Lymphocyte 32.6 %   Monocytes Relative 6.8 %   Eosinophils Relative 2.0 %   Basophils Relative 0.9 %  Lipid Profile     Status: Abnormal   Collection Time: 08/26/23 11:44 AM  Result Value Ref Range   Cholesterol 221 (H) <200 mg/dL   HDL 46 > OR = 40 mg/dL   Triglycerides 811 (H) <150 mg/dL    Comment: . If a non-fasting specimen was collected, consider repeat triglyceride testing on a fasting specimen if clinically indicated.  Perry Mount et al. J. of Clin. Lipidol. 2015;9:129-169. Marland Kitchen    LDL Cholesterol (Calc) 129 (H) mg/dL (calc)    Comment: Reference range: <100 . Desirable range <100 mg/dL for primary prevention;   <70 mg/dL for patients with CHD or diabetic patients  with > or = 2 CHD risk factors. Marland Kitchen LDL-C is now calculated using the Martin-Hopkins  calculation, which is a validated novel method providing  better accuracy than the Friedewald equation in the  estimation of LDL-C.  Horald Pollen et al. Lenox Ahr. 9147;829(56): 2061-2068  (http://education.[facility].com/faq/FAQ164)    Total CHOL/HDL Ratio 4.8 <5.0 (calc)   Non-HDL Cholesterol (Calc) 175 (H) <130 mg/dL (calc)    Comment: For patients with diabetes plus 1 major ASCVD risk  factor, treating to a non-HDL-C goal of <100 mg/dL  (LDL-C of <21 mg/dL) is considered a therapeutic  option.   HgB A1c     Status: Abnormal   Collection Time: 08/26/23 11:44 AM  Result Value Ref Range   Hgb A1c MFr Bld 6.6 (H) <5.7 % of total Hgb    Comment: For someone without known diabetes, a hemoglobin A1c value of 6.5% or greater indicates that they may have  diabetes and this should be confirmed with a follow-up   test. . For someone with known diabetes, a value <7% indicates  that their diabetes is well controlled and a value  greater than or equal to 7% indicates suboptimal  control. A1c targets should be individualized based on  duration of diabetes, age, comorbid conditions, and  other considerations. . Currently, no consensus exists regarding use of hemoglobin A1c for diagnosis of diabetes for children. .    Mean Plasma Glucose 143 mg/dL   eAG (mmol/L) 7.9 mmol/L     Fall Risk:    08/26/2023   10:34 AM 01/27/2023    9:04 AM 08/05/2022    2:33 PM 12/29/2021    8:56 AM 06/04/2021    3:31 PM  Fall Risk   Falls in the past year? 0 0 0 0 0  Number falls in past yr: 0 0 0 0 0  Injury with Fall? 0 0 0 0 0  Risk for fall due to : No Fall Risks No Fall Risks No Fall Risks    Follow up Falls prevention discussed;Education provided;Falls evaluation completed Falls prevention discussed;Education provided;Falls evaluation completed Falls prevention discussed;Education provided;Falls evaluation completed       Functional Status Survey:      Assessment & Plan  Problem List Items Addressed This Visit       Cardiovascular and Mediastinum   Essential hypertension (Chronic)   Chronic, historic condition BP is in goal today in office and he reports feeling better at home He is currently taking amlodipine 10 mg p.o. daily, metoprolol 100 mg p.o. daily, losartan 25 mg p.o. daily and appears to be tolerating well Continue current regimen Recommend checking blood pressures at home when able and bring  log to next appointment Follow-up in 6 months or sooner if concerns arise      Relevant Medications   losartan (COZAAR) 25 MG tablet     Other   Hyperlipidemia (Chronic)   Relevant Medications   losartan (COZAAR) 25 MG tablet   Other Relevant Orders   Lipid panel   Tobacco dependence   He has stopped taking chantix due to dreams and impact on sleep  He states he  has cut back on  cigarette use- states a pack lasts about 2 days now  Discussed continued gradual taper to quit and then can use nicotine replacement for assistance with continued cessation  Total discussion time: 4 minutes       Advanced care planning/counseling discussion   A voluntary discussion about advance care planning including the explanation and discussion of advance directives was extensively discussed  with the patient for 3  minutes with patient and myself   present.  Explanation about the health care proxy and Living will was reviewed and packet with forms with explanation of how to fill them out was given.  During this discussion, the patient was able to identify a health care proxy as  his wife, Sederick Lastinger and plans to fill out the paperwork required.  Patient was offered a separate Advance Care Planning visit for further assistance with forms.         Chronic pain of both shoulders   Chronic, ongoing Reports minimal improvement with Meloxicam and has gone back to using [facility] powders  We will get bilateral x-rays for evaluation Will try diclofenac 75 mg p.o. twice daily to assist with pain management in effort to reduce BC powder use Will likely need to refer to orthopedics for evaluation and potential joint injection.  May also benefit from physical therapy pending imaging results Will provide stretches as well to assist with mobility Follow-up as needed for progressing or persistent symptoms      Relevant Medications   diclofenac (VOLTAREN) 75 MG EC tablet   Other Relevant Orders   DG Shoulder Left   DG Shoulder Right   Other Visit Diagnoses       Annual physical exam    -  Primary   Relevant Orders   TSH   Hemoglobin A1c   Lipid panel   CBC with Differential/Platelet   COMPLETE METABOLIC PANEL WITH GFR   PSA     Screening for diabetes mellitus (DM)       Relevant Orders   Hemoglobin A1c     Screening for prostate cancer       Relevant Orders   PSA     Screening for thyroid  disorder       Relevant Orders   TSH       -Prostate cancer screening and PSA options (with potential risks and benefits of testing vs not testing) were discussed along with recent recs/guidelines. -USPSTF grade A and B recommendations reviewed with patient; age-appropriate recommendations, preventive care, screening tests, etc discussed and encouraged; healthy living encouraged; see AVS for patient education given to patient -Discussed importance of 150 minutes of physical activity weekly, eat two servings of fish weekly, eat one serving of tree nuts ( cashews, pistachios, pecans, almonds.Marland Kitchen) every other day, eat 6 servings of fruit/vegetables daily and drink plenty of water and avoid sweet beverages.  -Reviewed Health Maintenance: yes  Return in about 6 months (around 03/23/2024) for HTN, HLD, smoking.   I, Naveed Humphres E Ece Cumberland, PA-C, have reviewed all documentation  for this visit. The documentation on 09/23/23 for the exam, diagnosis, procedures, and orders are all accurate and complete.   Jacquelin Hawking, MHS, PA-C [facility]

## 2023-09-23 NOTE — Assessment & Plan Note (Signed)
He has stopped taking chantix due to dreams and impact on sleep  He states he  has cut back on cigarette use- states a pack lasts about 2 days now  Discussed continued gradual taper to quit and then can use nicotine replacement for assistance with continued cessation  Total discussion time: 4 minutes

## 2023-09-23 NOTE — Assessment & Plan Note (Addendum)
Chronic, ongoing Reports minimal improvement with Meloxicam and has gone back to using [facility] powders  We will get bilateral x-rays for evaluation Will try diclofenac 75 mg p.o. twice daily to assist with pain management in effort to reduce BC powder use Will likely need to refer to orthopedics for evaluation and potential joint injection.  May also benefit from physical therapy pending imaging results Will provide stretches as well to assist with mobility Follow-up as needed for progressing or persistent symptoms

## 2023-09-23 NOTE — Patient Instructions (Signed)
Please stop the Meloxicam and start the diclofenac twice per day  Do not use other NSAIDs while using this and make sure you staying well hydrated   We will call you with your xray results and next steps

## 2023-09-24 LAB — COMPLETE METABOLIC PANEL WITH GFR
AG Ratio: 2.2 (calc) (ref 1.0–2.5)
ALT: 19 U/L (ref 9–46)
AST: 15 U/L (ref 10–35)
Albumin: 5 g/dL (ref 3.6–5.1)
Alkaline phosphatase (APISO): 86 U/L (ref 35–144)
BUN: 20 mg/dL (ref 7–25)
CO2: 27 mmol/L (ref 20–32)
Calcium: 10.3 mg/dL (ref 8.6–10.3)
Chloride: 103 mmol/L (ref 98–110)
Creat: 0.7 mg/dL (ref 0.70–1.30)
Globulin: 2.3 g/dL (ref 1.9–3.7)
Glucose, Bld: 97 mg/dL (ref 65–99)
Potassium: 4.7 mmol/L (ref 3.5–5.3)
Sodium: 139 mmol/L (ref 135–146)
Total Bilirubin: 0.3 mg/dL (ref 0.2–1.2)
Total Protein: 7.3 g/dL (ref 6.1–8.1)
eGFR: 109 mL/min/{1.73_m2} (ref >=60)

## 2023-09-24 LAB — HEMOGLOBIN A1C
Hgb A1c MFr Bld: 6.4 %{Hb} — ABNORMAL HIGH (ref <5.7)
Mean Plasma Glucose: 137 mg/dL
eAG (mmol/L): 7.6 mmol/L

## 2023-09-24 LAB — CBC WITH DIFFERENTIAL/PLATELET
Absolute Lymphocytes: 2376 {cells}/uL (ref 850–3900)
Absolute Monocytes: 519 {cells}/uL (ref 200–950)
Basophils Absolute: 62 {cells}/uL (ref 0–200)
Basophils Relative: 0.7 %
Eosinophils Absolute: 211 {cells}/uL (ref 15–500)
Eosinophils Relative: 2.4 %
HCT: 45.3 % (ref 38.5–50.0)
Hemoglobin: 14.9 g/dL (ref 13.2–17.1)
MCH: 29.5 pg (ref 27.0–33.0)
MCHC: 32.9 g/dL (ref 32.0–36.0)
MCV: 89.7 fL (ref 80.0–100.0)
MPV: 11.7 fL (ref 7.5–12.5)
Monocytes Relative: 5.9 %
Neutro Abs: 5632 {cells}/uL (ref 1500–7800)
Neutrophils Relative %: 64 %
Platelets: 301 10*3/uL (ref 140–400)
RBC: 5.05 10*6/uL (ref 4.20–5.80)
RDW: 12.2 % (ref 11.0–15.0)
Total Lymphocyte: 27 %
WBC: 8.8 10*3/uL (ref 3.8–10.8)

## 2023-09-24 LAB — LIPID PANEL
Cholesterol: 217 mg/dL — ABNORMAL HIGH (ref <200)
HDL: 48 mg/dL (ref >=40)
LDL Cholesterol (Calc): 130 mg/dL — ABNORMAL HIGH
Non-HDL Cholesterol (Calc): 169 mg/dL — ABNORMAL HIGH (ref <130)
Total CHOL/HDL Ratio: 4.5 (calc) (ref <5.0)
Triglycerides: 254 mg/dL — ABNORMAL HIGH (ref <150)

## 2023-09-24 LAB — PSA: PSA: 0.28 ng/mL (ref <=4.00)

## 2023-09-24 LAB — TSH: TSH: 0.86 m[IU]/L (ref 0.40–4.50)

## 2023-10-05 NOTE — Progress Notes (Signed)
Your labs are back Your A1c has improved to 6.4 % - please continue to reduce excess sugars and carbohydrates and increase your exercise  Your cholesterol is still elevated - I recommend starting a cholesterol medication called a statin to help with controlling this. Please let us know if you would like this sent in for you.  Your thyroid testing was normal Your CBC was normal- no signs of anemia Your electrolytes, liver and kidney function were overall normal at this time Your prostate testing was normal  Please let us know if you would like to start the statin and where you would like it sent to. Let us know if you have further questions or concerns.

## 2023-11-24 ENCOUNTER — Other Ambulatory Visit: Payer: Self-pay | Admitting: Physician Assistant

## 2023-11-24 DIAGNOSIS — G8929 Other chronic pain: Secondary | ICD-10-CM

## 2023-11-25 NOTE — Telephone Encounter (Signed)
 Requested Prescriptions  Pending Prescriptions Disp Refills   diclofenac (VOLTAREN) 75 MG EC tablet [Pharmacy Med Name: Diclofenac Sodium 75 MG Oral Tablet Delayed Release] 180 tablet 1    Sig: Take 1 tablet by mouth twice daily     Analgesics:  NSAIDS Failed - 11/25/2023  9:09 AM      Failed - Manual Review: Labs are only required if the patient has taken medication for more than 8 weeks.      Passed - Cr in normal range and within 360 days    Creat  Date Value Ref Range Status  09/23/2023 0.70 0.70 - 1.30 mg/dL Final   Creatinine, Urine  Date Value Ref Range Status  03/23/2018 396 mg/dL Final         Passed - HGB in normal range and within 360 days    Hemoglobin  Date Value Ref Range Status  09/23/2023 14.9 13.2 - 17.1 g/dL Final  29/56/2130 86.5 12.6 - 17.7 g/dL Final         Passed - PLT in normal range and within 360 days    Platelets  Date Value Ref Range Status  09/23/2023 301 140 - 400 Thousand/uL Final  11/06/2015 169 150 - 379 x10E3/uL Final         Passed - HCT in normal range and within 360 days    HCT  Date Value Ref Range Status  09/23/2023 45.3 38.5 - 50.0 % Final   Hematocrit  Date Value Ref Range Status  11/06/2015 43.9 37.5 - 51.0 % Final         Passed - eGFR is 30 or above and within 360 days    GFR, Est African American  Date Value Ref Range Status  08/19/2020 128 > OR = 60 mL/min/1.51m2 Final   GFR, Est Non African American  Date Value Ref Range Status  08/19/2020 111 > OR = 60 mL/min/1.60m2 Final   eGFR  Date Value Ref Range Status  09/23/2023 109 > OR = 60 mL/min/1.77m2 Final         Passed - Patient is not pregnant      Passed - Valid encounter within last 12 months    Recent Outpatient Visits           2 months ago Annual physical exam   [place] [facility] Mecum, Oswaldo Conroy, PA-C   3 months ago Screening for colon cancer   [facility] Mecum, Oswaldo Conroy, PA-C   10 months ago Essential  hypertension   [place] [facility] Mecum, Oswaldo Conroy, PA-C   1 year ago Essential hypertension   [facility] Danelle Berry, PA-C   1 year ago Essential hypertension   [facility] Caro Lar[place], DO       Future Appointments             In 3 months Danelle Berry, PA-C [facility]

## 2024-01-07 ENCOUNTER — Other Ambulatory Visit: Payer: Self-pay | Admitting: Physician Assistant

## 2024-01-07 DIAGNOSIS — I10 Essential (primary) hypertension: Secondary | ICD-10-CM

## 2024-01-07 NOTE — Telephone Encounter (Signed)
 Requested Prescriptions  Pending Prescriptions Disp Refills   metoprolol succinate (TOPROL-XL) 100 MG 24 hr tablet [Pharmacy Med Name: Metoprolol Succinate ER 100 MG Oral Tablet Extended Release 24 Hour] 90 tablet 0    Sig: TAKE 1 TABLET BY MOUTH ONCE DAILY (TAKE  WITH  OR  IMMEDIATELY  FOLLOWING  A  MEAL)     Cardiovascular:  Beta Blockers Failed - 01/07/2024  4:29 PM      Failed - Valid encounter within last 6 months    Recent Outpatient Visits   None     Future Appointments             In 2 months Danelle Berry, PA-C [place] [facility]            Passed - Last BP in normal range    BP Readings from Last 1 Encounters:  09/23/23 128/78         Passed - Last Heart Rate in normal range    Pulse Readings from Last 1 Encounters:  09/23/23 84

## 2024-01-25 ENCOUNTER — Encounter: Payer: Self-pay | Admitting: Family Medicine

## 2024-01-25 ENCOUNTER — Ambulatory Visit (INDEPENDENT_AMBULATORY_CARE_PROVIDER_SITE_OTHER): Payer: MEDICAID | Admitting: Family Medicine

## 2024-01-25 VITALS — BP 130/80 | HR 110 | Temp 98.3°F | Resp 16 | Ht 69.0 in | Wt 202.4 lb

## 2024-01-25 DIAGNOSIS — I10 Essential (primary) hypertension: Secondary | ICD-10-CM | POA: Diagnosis not present

## 2024-01-25 DIAGNOSIS — F1090 Alcohol use, unspecified, uncomplicated: Secondary | ICD-10-CM | POA: Diagnosis not present

## 2024-01-25 DIAGNOSIS — F331 Major depressive disorder, recurrent, moderate: Secondary | ICD-10-CM

## 2024-01-25 DIAGNOSIS — E782 Mixed hyperlipidemia: Secondary | ICD-10-CM

## 2024-01-25 DIAGNOSIS — F419 Anxiety disorder, unspecified: Secondary | ICD-10-CM

## 2024-01-25 DIAGNOSIS — K219 Gastro-esophageal reflux disease without esophagitis: Secondary | ICD-10-CM | POA: Diagnosis not present

## 2024-01-25 DIAGNOSIS — F17200 Nicotine dependence, unspecified, uncomplicated: Secondary | ICD-10-CM

## 2024-01-25 DIAGNOSIS — K148 Other diseases of tongue: Secondary | ICD-10-CM

## 2024-01-25 MED ORDER — BUSPIRONE HCL 15 MG PO TABS: 15.0000 mg | ORAL_TABLET | Freq: Three times a day (TID) | ORAL | 1 refills | Status: DC | PRN

## 2024-01-25 MED ORDER — LOSARTAN POTASSIUM 25 MG PO TABS: 25.0000 mg | ORAL_TABLET | Freq: Every day | ORAL | 1 refills | Status: DC

## 2024-01-25 MED ORDER — ESOMEPRAZOLE MAGNESIUM 40 MG PO CPDR: 40.0000 mg | DELAYED_RELEASE_CAPSULE | Freq: Every day | ORAL | 1 refills | Status: DC

## 2024-01-25 MED ORDER — VENLAFAXINE HCL ER 225 MG PO TB24: 1.0000 | ORAL_TABLET | Freq: Every day | ORAL | 3 refills | Status: AC

## 2024-01-25 MED ORDER — AMLODIPINE BESYLATE 10 MG PO TABS: 10.0000 mg | ORAL_TABLET | Freq: Every day | ORAL | 1 refills | Status: DC

## 2024-01-25 NOTE — Progress Notes (Signed)
 Patient ID: [name] White, male    DOB: May 02, 1971, 53 y.o.   MRN: [id number]  PCP: [name] Hone, PA-C  Chief Complaint  Patient presents with   Alcohol Problem    Patient would like to have Naltrexone  prescribed    Subjective:   [name] White is a 53 y.o. male, presents to clinic with CC of the following:  HPI   Hypertension:  Currently managed on norvasc  and losartan  Pt reports good med compliance and denies any SE.   Blood pressure today is well controlled. BP Readings from Last 3 Encounters:  01/25/24 130/80  09/23/23 128/78  08/26/23 (!) 150/96   Pt denies CP, SOB, exertional sx, LE edema, palpitation, Ha's, visual disturbances, lightheadedness, hypotension, syncope.   Longterm gerd on meds, no prior GI consult, no dysphagia right now, he thinks if he can cut back more on ETOH it will help sx, he has sx if he skips meds - nexium  Rx and when out he will take OTC  HR rechecked manually 92 today Pulse Readings from Last 3 Encounters:  01/25/24 (!) 110  09/23/23 84  08/26/23 89    Anxiety/depression on buspar  prn Phq 9 and GAD 7 done, he is using less buspar  - he take one in the morning and then as needed after that, previously TID not 1-2 x a week   ETOH dependence/alcoholism, reports benders, using it as a was to escape life/stress/conflict, sometimes if he starts drinking he can't stop, he does regularly experience shakes "withdrawal" sx per pt only if he drinks 2 or more d in a row then the next day after that w/o ETOH he will have shakes - will have to take a drink top get it to stop Prefers vodka, but trying to avoid it and drink beer if he is drinking but he doesn't like beer   Alcohol use disorder, moderate to severe: Note: Not recommended for patients currently taking opioids or with acute hepatitis, liver enzymes >=3 to 5 times normal, or liver failure (Ref). May be initiated while the patient is actively drinking (Ref). Some experts consider  alternative therapy if goals are not met within 6 months of treatment (Ref). Oral: 50 mg once daily; may increase to 100 mg once daily after 1 week based on response and tolerability. Some experts initiate with 25 mg once daily for several days before increasing to 50 mg once daily (Ref).    Spot on tongue left side, white, painful - onset a month ago     Audit-C Alcohol Use Screening Flowsheet Row Office Visit from 01/25/2024 in [facility]  AUDIT-C Score 10     Score >30 on full Audit (see flowsheet) A score of 3 or more in women, and 4 or more in men indicates increased risk for alcohol abuse, EXCEPT if all of the points are from question 1  Wants to try naloxone, looked up meds/drug options, he attends AA when he can make the meetings No hx of seizures due to ETOH withdrawal but hospitalizations in the past with SI/intoxication  On venlafaxine  and buspar  -     01/25/2024   10:13 AM  Depression screen PHQ 2/9  Decreased Interest 1  Down, Depressed, Hopeless 0  PHQ - 2 Score 1  Altered sleeping 0  Tired, decreased energy 1  Change in appetite 0  Feeling bad or failure about yourself  0  Trouble concentrating 0  Moving slowly or fidgety/restless 0  Suicidal thoughts  0  PHQ-9 Score 2  Difficult doing work/chores Somewhat difficult    .    01/25/2024   10:44 AM 08/26/2023   10:41 AM 01/27/2023    9:13 AM 08/05/2022    2:33 PM  GAD 7 : Generalized Anxiety Score  Nervous, Anxious, on Edge 1 2 1 1   Control/stop worrying 2 2 1 1   Worry too much - different things 2 2 1 1   Trouble relaxing 1 2 1 1   Restless 1 2 1 1   Easily annoyed or irritable 2 2 0 0  Afraid - awful might happen 1 2 0 0  Total GAD 7 Score 10 14 5 5   Anxiety Difficulty Somewhat difficult Very difficult Somewhat difficult Somewhat difficult         Patient Active Problem List   Diagnosis Date Noted   Chronic pain of both shoulders 09/23/2023   Alcohol abuse, episodic  08/06/2022   Tobacco dependence 05/15/2020   Substance induced mood disorder (HCC) 04/07/2019   Abnormal nuclear stress test 12/31/2015   Snoring 11/06/2015   Elevated liver function tests 05/19/2015   Fatty liver 05/19/2015   Elevated serum GGT level 05/17/2015   IFG (impaired fasting glucose)    Hyperlipidemia    Essential hypertension    Depression    Anxiety    GERD (gastroesophageal reflux disease)    Overweight    History of alcohol use disorder    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   Avascular necrosis (HCC) 04/26/2013      Current Outpatient Medications:    amLODipine  (NORVASC ) 10 MG tablet, Take 1 tablet (10 mg total) by mouth daily., Disp: 90 tablet, Rfl: 1   busPIRone  (BUSPAR ) 15 MG tablet, Take 1 tablet (15 mg total) by mouth 3 (three) times daily as needed., Disp: 270 tablet, Rfl: 1   diclofenac  (VOLTAREN ) 75 MG EC tablet, Take 1 tablet by mouth twice daily, Disp: 180 tablet, Rfl: 1   esomeprazole  (NEXIUM ) 40 MG capsule, Take 1 capsule (40 mg total) by mouth daily., Disp: 90 capsule, Rfl: 1   losartan  (COZAAR ) 25 MG tablet, Take 1 tablet (25 mg total) by mouth daily., Disp: 90 tablet, Rfl: 1   metoprolol  succinate (TOPROL -XL) 100 MG 24 hr tablet, TAKE 1 TABLET BY MOUTH ONCE DAILY (TAKE  WITH  OR  IMMEDIATELY  FOLLOWING  A  MEAL), Disp: 90 tablet, Rfl: 0   Venlafaxine  HCl 225 MG TB24, Take 1 tablet (225 mg total) by mouth daily., Disp: 90 tablet, Rfl: 3   Allergies  Allergen Reactions   Ace Inhibitors Anaphylaxis   Captopril Anaphylaxis   Enalapril Anaphylaxis   Fosinopril Anaphylaxis   Lisinopril Anaphylaxis   Ramipril Anaphylaxis     Social History   Tobacco Use   Smoking status: Every Day    Current packs/day: 1.00    Average packs/day: 1 pack/day for 30.0 years (30.0 ttl pk-yrs)    Types: Cigarettes   Smokeless tobacco: Never   Tobacco comments:    discussed options   Vaping Use   Vaping status: Never Used  Substance Use Topics    Alcohol use: Not Currently    Comment: d/c ETOH 10 d ago 07/21/2019   Drug use: No    Comment: denies      Chart Review Today: I personally reviewed active problem list, medication list, allergies, family history, social history, health maintenance, notes from last encounter, lab results, imaging with the patient/caregiver today.   Review of Systems  Constitutional:  Negative.   HENT: Negative.    Eyes: Negative.   Respiratory: Negative.    Cardiovascular: Negative.   Gastrointestinal: Negative.   Endocrine: Negative.   Genitourinary: Negative.   Musculoskeletal: Negative.   Skin: Negative.   Allergic/Immunologic: Negative.   Neurological: Negative.   Hematological: Negative.   Psychiatric/Behavioral: Negative.    All other systems reviewed and are negative.      Objective:   Vitals:   01/25/24 1012  BP: 130/80  Pulse: (!) 110  Resp: 16  Temp: 98.3 F (36.8 C)  TempSrc: Oral  SpO2: 98%  Weight: 202 lb 6.4 oz (91.8 kg)  Height: 5\' 9"  (1.753 m)    Body mass index is 29.89 kg/m.  Physical Exam Vitals and nursing note reviewed.  Constitutional:      General: He is not in acute distress.    Appearance: Normal appearance. He is well-developed. He is not ill-appearing, toxic-appearing or diaphoretic.  HENT:     Head: Normocephalic and atraumatic.     Nose: Nose normal.  Eyes:     General: No scleral icterus.       Right eye: No discharge.        Left eye: No discharge.     Conjunctiva/sclera: Conjunctivae normal.  Neck:     Trachea: No tracheal deviation.  Cardiovascular:     Rate and Rhythm: Normal rate and regular rhythm.     Pulses: Normal pulses.     Heart sounds: Normal heart sounds. No murmur heard.    No friction rub. No gallop.  Pulmonary:     Effort: Pulmonary effort is normal. No respiratory distress.     Breath sounds: No stridor.  Skin:    General: Skin is warm and dry.     Coloration: Skin is not jaundiced.     Findings: No rash.   Neurological:     Mental Status: He is alert.     Motor: No abnormal muscle tone.     Coordination: Coordination normal.  Psychiatric:        Mood and Affect: Mood normal.        Behavior: Behavior normal.      Results for orders placed or performed in visit on 09/23/23  TSH   Collection Time: 09/23/23 11:22 AM  Result Value Ref Range   TSH 0.86 0.40 - 4.50 mIU/L  Hemoglobin A1c   Collection Time: 09/23/23 11:22 AM  Result Value Ref Range   Hgb A1c MFr Bld 6.4 (H) <5.7 % of total Hgb   Mean Plasma Glucose 137 mg/dL   eAG (mmol/L) 7.6 mmol/L  Lipid panel   Collection Time: 09/23/23 11:22 AM  Result Value Ref Range   Cholesterol 217 (H) <200 mg/dL   HDL 48 > OR = 40 mg/dL   Triglycerides 147 (H) <150 mg/dL   LDL Cholesterol (Calc) 130 (H) mg/dL (calc)   Total CHOL/HDL Ratio 4.5 <5.0 (calc)   Non-HDL Cholesterol (Calc) 169 (H) <130 mg/dL (calc)  CBC with Differential/Platelet   Collection Time: 09/23/23 11:22 AM  Result Value Ref Range   WBC 8.8 3.8 - 10.8 Thousand/uL   RBC 5.05 4.20 - 5.80 Million/uL   Hemoglobin 14.9 13.2 - 17.1 g/dL   HCT 82.9 56.2 - 13.0 %   MCV 89.7 80.0 - 100.0 fL   MCH 29.5 27.0 - 33.0 pg   MCHC 32.9 32.0 - 36.0 g/dL   RDW 86.5 78.4 - 69.6 %   Platelets 301 140 - 400 Thousand/uL  MPV 11.7 7.5 - 12.5 fL   Neutro Abs 5,632 1,500 - 7,800 cells/uL   Absolute Lymphocytes 2,376 850 - 3,900 cells/uL   Absolute Monocytes 519 200 - 950 cells/uL   Eosinophils Absolute 211 15 - 500 cells/uL   Basophils Absolute 62 0 - 200 cells/uL   Neutrophils Relative % 64 %   Total Lymphocyte 27.0 %   Monocytes Relative 5.9 %   Eosinophils Relative 2.4 %   Basophils Relative 0.7 %  COMPLETE METABOLIC PANEL WITH GFR   Collection Time: 09/23/23 11:22 AM  Result Value Ref Range   Glucose, Bld 97 65 - 99 mg/dL   BUN 20 7 - 25 mg/dL   Creat 6.96 2.95 - 2.84 mg/dL   eGFR 132 > OR = 60 GM/WNU/2.72Z3   BUN/Creatinine Ratio SEE NOTE: 6 - 22 (calc)   Sodium 139 135 -  146 mmol/L   Potassium 4.7 3.5 - 5.3 mmol/L   Chloride 103 98 - 110 mmol/L   CO2 27 20 - 32 mmol/L   Calcium  10.3 8.6 - 10.3 mg/dL   Total Protein 7.3 6.1 - 8.1 g/dL   Albumin 5.0 3.6 - 5.1 g/dL   Globulin 2.3 1.9 - 3.7 g/dL (calc)   AG Ratio 2.2 1.0 - 2.5 (calc)   Total Bilirubin 0.3 0.2 - 1.2 mg/dL   Alkaline phosphatase (APISO) 86 35 - 144 U/L   AST 15 10 - 35 U/L   ALT 19 9 - 46 U/L  PSA   Collection Time: 09/23/23 11:22 AM  Result Value Ref Range   PSA 0.28 < OR = 4.00 ng/mL       Assessment & Plan:     ICD-10-CM   1. Alcohol use  F10.90 naltrexone  (DEPADE) 50 MG tablet   AA when available - Q's about use of naltrexone , reviewed med, consulted SP notified pt he can try for helping reduce alcohol    2. Essential hypertension  I10 amLODipine  (NORVASC ) 10 MG tablet    losartan  (COZAAR ) 25 MG tablet   Bp at goal today, he needs med refills    3. Anxiety  F41.9 busPIRone  (BUSPAR ) 15 MG tablet    Venlafaxine  HCl 225 MG TB24   sx fairly well controled on venlafaxine  - reviewed phq 9    4. Gastroesophageal reflux disease without esophagitis  K21.9 esomeprazole  (NEXIUM ) 40 MG capsule   nexium /PPI use daily, he can tell sx are less severe or resolve with less ETOH, with extensive hx asked pt to consider GI consult    5. Moderate episode of recurrent major depressive disorder (HCC)  F33.1 Venlafaxine  HCl 225 MG TB24   see anxiety above, phq9 and gad 7 reviewed, stable sx, meds refilled w/o dose changes at this time    6. Lesion of tongue  K14.8    white spot on left side of tongue, hurts, onset over a month ago - he going to ask dentist, can do ENT consult for bx?    7. Mixed hyperlipidemia  E78.2     8. Tobacco dependence  F17.200      Prior OV with float PA recommended statin, pt has not agreed to start one Lab Results  Component Value Date   CHOL 217 (H) 09/23/2023   HDL 48 09/23/2023   LDLCALC 130 (H) 09/23/2023   TRIG 254 (H) 09/23/2023   CHOLHDL 4.5 09/23/2023   The 10-year ASCVD risk score (Arnett DK, et al., 2019) is: 14%   Values used to calculate the  score:     Age: 53 years     Sex: Male     Is Non-Hispanic African American: No     Diabetic: No     Tobacco smoker: Yes     Systolic Blood Pressure: 130 mmHg     Is BP treated: Yes     HDL Cholesterol: 48 mg/dL     Total Cholesterol: 217 mg/dL  Upon further review it looks like A1c was also elevated in T2 Dm range but, then rechecked one month later by same provider and was then 6.4% -no dx added to chart so did not see nor discuss with pt at appt today - large amount of time was spent discussing ETOH hx, meds, AA, etc and refilling meds f/up on mood and BP Pt wished not to do labs today - will have him to short f/up and recheck needed labs     [name] Hone, PA-C 01/26/24 8:38 AM

## 2024-01-26 MED ORDER — NALTREXONE HCL 50 MG PO TABS: ORAL_TABLET | ORAL | 2 refills | Status: DC

## 2024-02-10 ENCOUNTER — Encounter: Payer: Self-pay | Admitting: Family Medicine

## 2024-03-23 ENCOUNTER — Ambulatory Visit: Payer: Self-pay | Admitting: Family Medicine

## 2024-03-23 DIAGNOSIS — F17200 Nicotine dependence, unspecified, uncomplicated: Secondary | ICD-10-CM

## 2024-03-23 DIAGNOSIS — I10 Essential (primary) hypertension: Secondary | ICD-10-CM

## 2024-03-23 DIAGNOSIS — E782 Mixed hyperlipidemia: Secondary | ICD-10-CM

## 2024-03-23 DIAGNOSIS — F331 Major depressive disorder, recurrent, moderate: Secondary | ICD-10-CM

## 2024-03-23 DIAGNOSIS — K219 Gastro-esophageal reflux disease without esophagitis: Secondary | ICD-10-CM

## 2024-04-06 ENCOUNTER — Other Ambulatory Visit: Payer: Self-pay | Admitting: Family Medicine

## 2024-04-06 DIAGNOSIS — I10 Essential (primary) hypertension: Secondary | ICD-10-CM

## 2024-04-10 ENCOUNTER — Other Ambulatory Visit: Payer: Self-pay | Admitting: Family Medicine

## 2024-04-10 DIAGNOSIS — I10 Essential (primary) hypertension: Secondary | ICD-10-CM

## 2024-04-10 NOTE — Telephone Encounter (Signed)
 Requested Prescriptions  Pending Prescriptions Disp Refills   metoprolol  succinate (TOPROL -XL) 100 MG 24 hr tablet [Pharmacy Med Name: Metoprolol  Succinate ER 100 MG Oral Tablet Extended Release 24 Hour] 90 tablet 0    Sig: TAKE 1 TABLET BY MOUTH ONCE DAILY WITH OR IMMEDIATELY FOLLOWING A MEAL     Cardiovascular:  Beta Blockers Passed - 04/10/2024  2:23 PM      Passed - Last BP in normal range    BP Readings from Last 1 Encounters:  01/25/24 130/80         Passed - Last Heart Rate in normal range    Pulse Readings from Last 1 Encounters:  01/25/24 (!) 110         Passed - Valid encounter within last 6 months    Recent Outpatient Visits           2 months ago Alcohol use   [facility] [place], Michelene, PA-C

## 2024-04-12 NOTE — Telephone Encounter (Signed)
 Requested medication (s) are due for refill today: na  Requested medication (s) are on the active medication list: yes  Last refill:  04/10/24 #90 0 refills  Future visit scheduled: no   Notes to clinic:  do you want to refill Rx? Receipt confirmed by pharmacy 04/10/24 at 2:23 pm but pharmacy resending request.     Requested Prescriptions  Pending Prescriptions Disp Refills   metoprolol  succinate (TOPROL -XL) 100 MG 24 hr tablet [Pharmacy Med Name: Metoprolol  Succinate ER 100 MG Oral Tablet Extended Release 24 Hour] 90 tablet 0    Sig: TAKE 1 TABLET BY MOUTH ONCE DAILY (TAKE  WITH  OR  IMMEDIATELY  FOLLOWING  A  MEAL)     Cardiovascular:  Beta Blockers Passed - 04/12/2024 12:07 PM      Passed - Last BP in normal range    BP Readings from Last 1 Encounters:  01/25/24 130/80         Passed - Last Heart Rate in normal range    Pulse Readings from Last 1 Encounters:  01/25/24 (!) 110         Passed - Valid encounter within last 6 months    Recent Outpatient Visits           2 months ago Alcohol use   [facility] [place], Michelene, PA-C

## 2024-06-29 ENCOUNTER — Other Ambulatory Visit: Payer: Self-pay | Admitting: Family Medicine

## 2024-06-29 DIAGNOSIS — I10 Essential (primary) hypertension: Secondary | ICD-10-CM

## 2024-06-30 NOTE — Telephone Encounter (Signed)
 Patient will need an office visit for additional refills. Requested Prescriptions  Pending Prescriptions Disp Refills   metoprolol  succinate (TOPROL -XL) 100 MG 24 hr tablet [Pharmacy Med Name: Metoprolol  Succinate ER 100 MG Oral Tablet Extended Release 24 Hour] 90 tablet 0    Sig: TAKE 1 TABLET BY MOUTH ONCE DAILY (TAKE  WITH  OR  IMMEDIATELY  FOLLOWING  A  MEAL)     Cardiovascular:  Beta Blockers Passed - 06/30/2024 12:03 PM      Passed - Last BP in normal range    BP Readings from Last 1 Encounters:  01/25/24 130/80         Passed - Last Heart Rate in normal range    Pulse Readings from Last 1 Encounters:  01/25/24 (!) 110         Passed - Valid encounter within last 6 months    Recent Outpatient Visits           5 months ago Alcohol use   [facility] [place], Michelene, PA-C

## 2024-07-11 ENCOUNTER — Ambulatory Visit
Admission: RE | Admit: 2024-07-11 | Discharge: 2024-07-11 | Disposition: A | Discharge status: Home / Self Care | Payer: MEDICAID | Source: Ambulatory Visit | Attending: Internal Medicine | Admitting: Internal Medicine

## 2024-07-11 ENCOUNTER — Ambulatory Visit
Admission: RE | Admit: 2024-07-11 | Discharge: 2024-07-11 | Disposition: A | Discharge status: Home / Self Care | Payer: MEDICAID | Attending: Internal Medicine | Admitting: Internal Medicine

## 2024-07-11 ENCOUNTER — Ambulatory Visit (INDEPENDENT_AMBULATORY_CARE_PROVIDER_SITE_OTHER): Payer: MEDICAID | Admitting: Internal Medicine

## 2024-07-11 ENCOUNTER — Encounter: Payer: Self-pay | Admitting: Internal Medicine

## 2024-07-11 ENCOUNTER — Other Ambulatory Visit: Payer: Self-pay

## 2024-07-11 VITALS — BP 132/84 | HR 100 | Temp 98.2°F | Resp 16 | Ht 69.0 in | Wt 192.0 lb

## 2024-07-11 DIAGNOSIS — K219 Gastro-esophageal reflux disease without esophagitis: Secondary | ICD-10-CM | POA: Diagnosis not present

## 2024-07-11 DIAGNOSIS — I10 Essential (primary) hypertension: Secondary | ICD-10-CM

## 2024-07-11 DIAGNOSIS — R051 Acute cough: Secondary | ICD-10-CM | POA: Insufficient documentation

## 2024-07-11 DIAGNOSIS — R7303 Prediabetes: Secondary | ICD-10-CM

## 2024-07-11 DIAGNOSIS — F419 Anxiety disorder, unspecified: Secondary | ICD-10-CM | POA: Diagnosis not present

## 2024-07-11 DIAGNOSIS — Z125 Encounter for screening for malignant neoplasm of prostate: Secondary | ICD-10-CM

## 2024-07-11 DIAGNOSIS — F1091 Alcohol use, unspecified, in remission: Secondary | ICD-10-CM

## 2024-07-11 DIAGNOSIS — Z13220 Encounter for screening for lipoid disorders: Secondary | ICD-10-CM

## 2024-07-11 LAB — POC COVID19/FLU A&B COMBO
Covid Antigen, POC: NEGATIVE
Influenza A Antigen, POC: NEGATIVE
Influenza B Antigen, POC: NEGATIVE

## 2024-07-11 LAB — POCT RAPID STREP A (OFFICE): Rapid Strep A Screen: NEGATIVE

## 2024-07-11 MED ORDER — NALTREXONE HCL 50 MG PO TABS: 50.0000 mg | ORAL_TABLET | Freq: Every day | ORAL | 1 refills | Status: AC

## 2024-07-11 MED ORDER — ESOMEPRAZOLE MAGNESIUM 40 MG PO CPDR
40.0000 mg | DELAYED_RELEASE_CAPSULE | Freq: Every day | ORAL | 1 refills | Status: AC
Start: 2024-07-11 — End: ?

## 2024-07-11 MED ORDER — LOSARTAN POTASSIUM 25 MG PO TABS: 25.0000 mg | ORAL_TABLET | Freq: Every day | ORAL | 1 refills | Status: AC

## 2024-07-11 MED ORDER — METOPROLOL SUCCINATE ER 100 MG PO TB24: ORAL_TABLET | ORAL | 1 refills | Status: AC

## 2024-07-11 MED ORDER — AMOXICILLIN-POT CLAVULANATE 875-125 MG PO TABS: 1.0000 | ORAL_TABLET | Freq: Two times a day (BID) | ORAL | 0 refills | Status: AC

## 2024-07-11 MED ORDER — BUSPIRONE HCL 15 MG PO TABS: 15.0000 mg | ORAL_TABLET | Freq: Three times a day (TID) | ORAL | 1 refills | Status: AC | PRN

## 2024-07-11 MED ORDER — AMLODIPINE BESYLATE 10 MG PO TABS: 10.0000 mg | ORAL_TABLET | Freq: Every day | ORAL | 1 refills | Status: AC

## 2024-07-11 NOTE — Progress Notes (Signed)
 Established Patient Office Visit  Subjective   Patient ID: [name] White, male    DOB: [birth year]  Age: 53 y.o. MRN: [id number]  Chief Complaint  Patient presents with   Medication Refill   URI    Sweating, cough, congested, scratchy throat    Medication Refill Associated symptoms include chills, congestion, coughing and a sore throat. Pertinent negatives include no fever.  URI  Associated symptoms include congestion, coughing and a sore throat. Pertinent negatives include no sinus pain or wheezing.    Patient is here for follow up on chronic medical conditions and for cough, congestion, etc.   Discussed the use of AI scribe software for clinical note transcription with the patient, who gave verbal consent to proceed.  History of Present Illness [name] White is a 53 year old male who presents with persistent sinus symptoms and medication refills.  Sinus symptoms began at the start of the week, initially improving with Alka-Seltzer but worsening by Saturday. Sinus drainage worsens when lying down, causing gagging and occasional coughing. Sweating occurs with physical exertion and at night. He has not eaten in two and a half days due to altered taste, including water . No fever is present.  Hypertension is managed with metoprolol  100 mg, amlodipine  10 mg, and losartan , with stable blood pressure since quitting alcohol and starting plasma donation. He is not taking naltrexone  due to plasma donation concerns but manages occasional cravings. Venlafaxine  is used for mood, and Buspar  15 mg is taken as needed, usually once daily. Nexium  is effective for acid reflux.  He denies chronic respiratory illness like COPD and has no known allergies to antibiotics. No history of pneumonia is reported.   Hypertension: -Medications: Amlodipine  10 mg, Losartan  25 mg, Metoprolol  XL 100 mg -Patient is compliant with above medications and reports no side effects. -Denies any SOB, CP,  vision changes, LE edema or symptoms of hypotension  GERD: -Currently on Nexium  40 mg and symptoms are well controlled.   Pre-Diabetes: -Last A1c 6.4% 12/24 -Not on medications for blood sugars.   MDD: -Mood status: stable -Current treatment: Effexor  225 mg, Naltrexone  50 mg and Buspar  15 mg PRN -Satisfied with current treatment?: yes -Duration of current treatment : chronic -Side effects: no Medication compliance: excellent compliance     01/25/2024   10:13 AM 08/26/2023   10:34 AM 01/27/2023    9:05 AM 08/05/2022    2:33 PM 12/29/2021    8:58 AM  Depression screen PHQ 2/9  Decreased Interest 1 2 2 1  0  Down, Depressed, Hopeless 0 2 2 1  0  PHQ - 2 Score 1 4 4 2  0  Altered sleeping 0 0 3 1 0  Tired, decreased energy 1 2 3 1  0  Change in appetite 0 0 0 0 0  Feeling bad or failure about yourself  0 0 1 0 0  Trouble concentrating 0 1 2 0 0  Moving slowly or fidgety/restless 0 0 0 0 0  Suicidal thoughts 0 0 1 0 0  PHQ-9 Score 2 7 14 4  0  Difficult doing work/chores Somewhat difficult Very difficult Somewhat difficult Somewhat difficult Not difficult at all   Health Maintenance: -Blood work due    Review of Systems  Constitutional:  Positive for chills. Negative for fever.  HENT:  Positive for congestion and sore throat. Negative for sinus pain.   Respiratory:  Positive for cough and sputum production. Negative for shortness of breath and wheezing.  Objective:     BP 132/84 (Cuff Size: Large)   Pulse 100   Temp 98.2 F (36.8 C) (Oral)   Resp 16   Ht 5' 9 (1.753 m)   Wt 192 lb (87.1 kg)   SpO2 98%   BMI 28.35 kg/m  BP Readings from Last 3 Encounters:  07/11/24 132/84  01/25/24 130/80  09/23/23 128/78   Wt Readings from Last 3 Encounters:  07/11/24 192 lb (87.1 kg)  01/25/24 202 lb 6.4 oz (91.8 kg)  09/23/23 205 lb (93 kg)      Physical Exam Constitutional:      Appearance: Normal appearance. He is ill-appearing and diaphoretic.  HENT:      Head: Normocephalic and atraumatic.     Mouth/Throat:     Mouth: Mucous membranes are moist.     Pharynx: Posterior oropharyngeal erythema present.  Eyes:     Conjunctiva/sclera: Conjunctivae normal.  Cardiovascular:     Rate and Rhythm: Normal rate and regular rhythm.  Pulmonary:     Effort: Pulmonary effort is normal.     Breath sounds: Rhonchi present.  Skin:    General: Skin is warm.  Neurological:     General: No focal deficit present.     Mental Status: He is alert. Mental status is at baseline.  Psychiatric:        Mood and Affect: Mood normal.        Behavior: Behavior normal.      Results for orders placed or performed in visit on 07/11/24  POCT rapid strep A  Result Value Ref Range   Rapid Strep A Screen Negative Negative  POC Covid19/Flu A&B Antigen  Result Value Ref Range   Influenza A Antigen, POC Negative Negative   Influenza B Antigen, POC Negative Negative   Covid Antigen, POC Negative Negative    Last CBC Lab Results  Component Value Date   WBC 8.8 09/23/2023   HGB 14.9 09/23/2023   HCT 45.3 09/23/2023   MCV 89.7 09/23/2023   MCH 29.5 09/23/2023   RDW 12.2 09/23/2023   PLT 301 09/23/2023   Last metabolic panel Lab Results  Component Value Date   GLUCOSE 97 09/23/2023   NA 139 09/23/2023   K 4.7 09/23/2023   CL 103 09/23/2023   CO2 27 09/23/2023   BUN 20 09/23/2023   CREATININE 0.70 09/23/2023   EGFR 109 09/23/2023   CALCIUM  10.3 09/23/2023   PHOS 7.4 (H) 03/24/2018   PROT 7.3 09/23/2023   ALBUMIN 4.8 04/06/2019   LABGLOB 2.9 03/23/2018   AGRATIO 1.2 03/23/2018   BILITOT 0.3 09/23/2023   ALKPHOS 96 04/06/2019   AST 15 09/23/2023   ALT 19 09/23/2023   ANIONGAP 15 04/06/2019   Last lipids Lab Results  Component Value Date   CHOL 217 (H) 09/23/2023   HDL 48 09/23/2023   LDLCALC 130 (H) 09/23/2023   TRIG 254 (H) 09/23/2023   CHOLHDL 4.5 09/23/2023   Last hemoglobin A1c Lab Results  Component Value Date   HGBA1C 6.4 (H)  09/23/2023   Last thyroid functions Lab Results  Component Value Date   TSH 0.86 09/23/2023   Last vitamin D No results found for: 25OHVITD2, 25OHVITD3, VD25OH Last vitamin B12 and Folate Lab Results  Component Value Date   VITAMINB12 >2,000 (H) 11/04/2017      The 10-year ASCVD risk score (Arnett DK, et al., 2019) is: 14.4%    Assessment & Plan:   Assessment & Plan Acute upper respiratory infection  with sinus symptoms Symptoms suggest bacterial infection post-viral illness. Differential includes bacterial sinusitis and possible pneumonia. Rapid flu/COVID and strep all negative in the office today. - Order chest x-ray at [facility]. - Prescribe broad-spectrum antibiotic for sinus infection and possible pneumonia.  Hypertension Blood pressure controlled at 132/84 mmHg with current medication regimen. - Refill metoprolol  100 mg. - Refill amlodipine  10 mg. - Refill losartan .  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 Naltrexone  previously effective in reducing cravings. - Prescribe naltrexone  50 mg once daily. - Advise to question plasma donation center if issues arise regarding naltrexone .  Generalized anxiety disorder and depression Managed with venlafaxine  and Buspar . - Refill venlafaxine  (Effexor ) as previously prescribed. - Refill Buspar  15 mg, 90 tablets with instructions to take as needed.  Gastroesophageal reflux disease (GERD) Symptoms controlled with Nexium . - Refill Nexium .  Pre-Diabetes -Recheck A1c.   General Health Maintenance Due for annual labs in December. - Order annual labs including kidney, liver, electrolytes, anemia screening, prostate cancer screening, cholesterol, and A1c.  Follow-Up Follow up based on chest x-ray results and completion of labs. - Provide work note for absence from Sunday to Tuesday, with return on Thursday. - Advise to return for labs if not completed  today. - Schedule follow-up based on chest x-ray results.  - POCT rapid strep A - POC Covid19/Flu A&B Antigen - DG Chest 2 View; Future - amoxicillin-clavulanate (AUGMENTIN) 875-125 MG tablet; Take 1 tablet by mouth 2 (two) times daily for 5 days.  Dispense: 10 tablet; Refill: 0 - metoprolol  succinate (TOPROL -XL) 100 MG 24 hr tablet; TAKE 1 TABLET BY MOUTH ONCE DAILY (TAKE  WITH  OR  IMMEDIATELY  FOLLOWING  A  MEAL)  Dispense: 90 tablet; Refill: 1 - amLODipine  (NORVASC ) 10 MG tablet; Take 1 tablet (10 mg total) by mouth daily.  Dispense: 90 tablet; Refill: 1 - losartan  (COZAAR ) 25 MG tablet; Take 1 tablet (25 mg total) by mouth daily.  Dispense: 90 tablet; Refill: 1 - CBC w/Diff/Platelet - Comprehensive Metabolic Panel (CMET) - esomeprazole  (NEXIUM ) 40 MG capsule; Take 1 capsule (40 mg total) by mouth daily.  Dispense: 90 capsule; Refill: 1 - busPIRone  (BUSPAR ) 15 MG tablet; Take 1 tablet (15 mg total) by mouth 3 (three) times daily as needed.  Dispense: 90 tablet; Refill: 1 - naltrexone  (DEPADE) 50 MG tablet; Take 1 tablet (50 mg total) by mouth daily.  Dispense: 90 tablet; Refill: 1 - HgB A1c - Lipid Profile - PSA   Return in about 6 months (around 01/09/2025).    Sharyle Fischer, DO

## 2024-07-12 ENCOUNTER — Ambulatory Visit: Payer: Self-pay | Admitting: Internal Medicine

## 2024-07-12 LAB — COMPREHENSIVE METABOLIC PANEL WITH GFR
AG Ratio: 1.8 (calc) (ref 1.0–2.5)
ALT: 31 U/L (ref 9–46)
AST: 26 U/L (ref 10–35)
Albumin: 4.7 g/dL (ref 3.6–5.1)
Alkaline phosphatase (APISO): 111 U/L (ref 35–144)
BUN: 16 mg/dL (ref 7–25)
CO2: 27 mmol/L (ref 20–32)
Calcium: 10.2 mg/dL (ref 8.6–10.3)
Chloride: 91 mmol/L — ABNORMAL LOW (ref 98–110)
Creat: 1.24 mg/dL (ref 0.70–1.30)
Globulin: 2.6 g/dL (ref 1.9–3.7)
Glucose, Bld: 147 mg/dL — ABNORMAL HIGH (ref 65–99)
Potassium: 3.9 mmol/L (ref 3.5–5.3)
Sodium: 132 mmol/L — ABNORMAL LOW (ref 135–146)
Total Bilirubin: 0.9 mg/dL (ref 0.2–1.2)
Total Protein: 7.3 g/dL (ref 6.1–8.1)
eGFR: 69 mL/min/1.73m2 (ref >=60)

## 2024-07-12 LAB — LIPID PANEL
Cholesterol: 322 mg/dL — ABNORMAL HIGH (ref <200)
HDL: 59 mg/dL (ref >=40)
Non-HDL Cholesterol (Calc): 263 mg/dL — ABNORMAL HIGH (ref <130)
Total CHOL/HDL Ratio: 5.5 (calc) — ABNORMAL HIGH (ref <5.0)
Triglycerides: 993 mg/dL — ABNORMAL HIGH (ref <150)

## 2024-07-12 LAB — CBC WITH DIFFERENTIAL/PLATELET
Absolute Lymphocytes: 1416 {cells}/uL (ref 850–3900)
Absolute Monocytes: 500 {cells}/uL (ref 200–950)
Basophils Absolute: 36 {cells}/uL (ref 0–200)
Basophils Relative: 0.3 %
Eosinophils Absolute: 83 {cells}/uL (ref 15–500)
Eosinophils Relative: 0.7 %
HCT: 50.6 % — ABNORMAL HIGH (ref 38.5–50.0)
Hemoglobin: 17.9 g/dL — ABNORMAL HIGH (ref 13.2–17.1)
MCH: 31.8 pg (ref 27.0–33.0)
MCHC: 35.4 g/dL (ref 32.0–36.0)
MCV: 89.9 fL (ref 80.0–100.0)
MPV: 10.6 fL (ref 7.5–12.5)
Monocytes Relative: 4.2 %
Neutro Abs: 9865 {cells}/uL — ABNORMAL HIGH (ref 1500–7800)
Neutrophils Relative %: 82.9 %
Platelets: 401 Thousand/uL — ABNORMAL HIGH (ref 140–400)
RBC: 5.63 Million/uL (ref 4.20–5.80)
RDW: 13.7 % (ref 11.0–15.0)
Total Lymphocyte: 11.9 %
WBC: 11.9 Thousand/uL — ABNORMAL HIGH (ref 3.8–10.8)

## 2024-07-12 LAB — PSA: PSA: 0.35 ng/mL (ref <=4.00)

## 2024-07-12 LAB — HEMOGLOBIN A1C
Hgb A1c MFr Bld: 6.1 % — ABNORMAL HIGH (ref <5.7)
Mean Plasma Glucose: 128 mg/dL
eAG (mmol/L): 7.1 mmol/L

## 2024-07-13 ENCOUNTER — Other Ambulatory Visit: Payer: Self-pay | Admitting: Internal Medicine

## 2024-07-13 ENCOUNTER — Encounter: Payer: Self-pay | Admitting: Internal Medicine

## 2024-07-13 DIAGNOSIS — E782 Mixed hyperlipidemia: Secondary | ICD-10-CM

## 2024-07-13 MED ORDER — ROSUVASTATIN CALCIUM 10 MG PO TABS: 10.0000 mg | ORAL_TABLET | Freq: Every day | ORAL | 3 refills | Status: AC

## 2024-09-04 ENCOUNTER — Telehealth: Payer: Self-pay | Admitting: Family Medicine

## 2024-09-04 DIAGNOSIS — G8929 Other chronic pain: Secondary | ICD-10-CM

## 2024-09-04 MED ORDER — DICLOFENAC SODIUM 75 MG PO TBEC: 75.0000 mg | DELAYED_RELEASE_TABLET | Freq: Two times a day (BID) | ORAL | 0 refills | Status: AC

## 2024-09-04 NOTE — Telephone Encounter (Signed)
diclofenac (VOLTAREN) 75 MG EC tablet

## 2024-09-18 ENCOUNTER — Other Ambulatory Visit (HOSPITAL_COMMUNITY): Payer: Self-pay

## 2024-09-18 ENCOUNTER — Telehealth: Payer: Self-pay | Admitting: Pharmacy Technician

## 2024-09-18 NOTE — Telephone Encounter (Signed)
 Pharmacy Patient Advocate Encounter   Received notification from Onbase that prior authorization for Venlafaxine  HCl ER 225MG  er tablets  is required/requested.   Insurance verification completed.   The patient is insured through [employer].   Per test claim: PA required; PA submitted to above mentioned insurance via Latent Key/confirmation #/EOC [id number] Status is pending

## 2024-09-19 ENCOUNTER — Other Ambulatory Visit (HOSPITAL_COMMUNITY): Payer: Self-pay

## 2024-09-19 NOTE — Telephone Encounter (Signed)
 Pharmacy Patient Advocate Encounter  Received notification from [facility] that Prior Authorization for Venlafaxine  HCl ER 225MG  er tablets has been APPROVED from 09/18/24 to 09/17/25. Ran test claim, Copay is $4.00. This test claim was processed through [facility]- copay amounts may vary at other pharmacies due to pharmacy/plan contracts, or as the patient moves through the different stages of their insurance plan.   PA #/Case ID/Reference #: [id number]

## 2025-01-09 ENCOUNTER — Ambulatory Visit: Payer: MEDICAID | Admitting: Family Medicine
